# Patient Record
Sex: Female | Born: 1960
Health system: Southern US, Community
[De-identification: ages and names within clinical notes are randomized; demographics above are authoritative.]

## PROBLEM LIST (undated history)

## (undated) DIAGNOSIS — K219 Gastro-esophageal reflux disease without esophagitis: Secondary | ICD-10-CM

## (undated) DIAGNOSIS — Z800 Family history of malignant neoplasm of digestive organs: Secondary | ICD-10-CM

## (undated) DIAGNOSIS — M3500 Sicca syndrome, unspecified: Secondary | ICD-10-CM

## (undated) DIAGNOSIS — F32A Depression, unspecified: Secondary | ICD-10-CM

## (undated) DIAGNOSIS — G4730 Sleep apnea, unspecified: Secondary | ICD-10-CM

## (undated) DIAGNOSIS — N200 Calculus of kidney: Secondary | ICD-10-CM

## (undated) DIAGNOSIS — M810 Age-related osteoporosis without current pathological fracture: Secondary | ICD-10-CM

## (undated) DIAGNOSIS — F329 Major depressive disorder, single episode, unspecified: Secondary | ICD-10-CM

## (undated) DIAGNOSIS — K449 Diaphragmatic hernia without obstruction or gangrene: Secondary | ICD-10-CM

## (undated) DIAGNOSIS — G8929 Other chronic pain: Secondary | ICD-10-CM

## (undated) DIAGNOSIS — R51 Headache: Secondary | ICD-10-CM

## (undated) DIAGNOSIS — R519 Headache, unspecified: Secondary | ICD-10-CM

## (undated) DIAGNOSIS — M5430 Sciatica, unspecified side: Secondary | ICD-10-CM

## (undated) DIAGNOSIS — K589 Irritable bowel syndrome without diarrhea: Secondary | ICD-10-CM

## (undated) DIAGNOSIS — K2970 Gastritis, unspecified, without bleeding: Secondary | ICD-10-CM

## (undated) DIAGNOSIS — R7309 Other abnormal glucose: Secondary | ICD-10-CM

## (undated) DIAGNOSIS — M7500 Adhesive capsulitis of unspecified shoulder: Secondary | ICD-10-CM

## (undated) HISTORY — PX: LITHOTRIPSY: SUR834

## (undated) HISTORY — DX: Headache: R51

## (undated) HISTORY — DX: Irritable bowel syndrome, unspecified: K58.9

## (undated) HISTORY — DX: Diaphragmatic hernia without obstruction or gangrene: K44.9

## (undated) HISTORY — DX: Other chronic pain: G89.29

## (undated) HISTORY — DX: Headache, unspecified: R51.9

## (undated) HISTORY — PX: TONSILLECTOMY: SUR1361

## (undated) HISTORY — DX: Age-related osteoporosis without current pathological fracture: M81.0

## (undated) HISTORY — DX: Sjogren syndrome, unspecified: M35.00

## (undated) HISTORY — DX: Other abnormal glucose: R73.09

## (undated) HISTORY — DX: Adhesive capsulitis of unspecified shoulder: M75.00

## (undated) HISTORY — DX: Sciatica, unspecified side: M54.30

## (undated) HISTORY — DX: Sicca syndrome, unspecified: M35.00

## (undated) HISTORY — DX: Gastro-esophageal reflux disease without esophagitis: K21.9

## (undated) HISTORY — DX: Calculus of kidney: N20.0

## (undated) HISTORY — DX: Depression, unspecified: F32.A

## (undated) HISTORY — DX: Family history of malignant neoplasm of digestive organs: Z80.0

## (undated) HISTORY — DX: Sleep apnea, unspecified: G47.30

## (undated) HISTORY — DX: Gastritis, unspecified, without bleeding: K29.70

## (undated) HISTORY — DX: Major depressive disorder, single episode, unspecified: F32.9

---

## 1998-11-02 HISTORY — PX: CHOLECYSTECTOMY: SHX55

## 2000-07-27 ENCOUNTER — Encounter: Payer: Self-pay | Admitting: Gastroenterology

## 2000-07-27 DIAGNOSIS — K2970 Gastritis, unspecified, without bleeding: Secondary | ICD-10-CM | POA: Insufficient documentation

## 2000-07-27 DIAGNOSIS — K2990 Gastroduodenitis, unspecified, without bleeding: Secondary | ICD-10-CM

## 2000-08-06 ENCOUNTER — Encounter (INDEPENDENT_AMBULATORY_CARE_PROVIDER_SITE_OTHER): Payer: Self-pay | Admitting: *Deleted

## 2000-08-06 ENCOUNTER — Ambulatory Visit (HOSPITAL_COMMUNITY): Admission: RE | Admit: 2000-08-06 | Discharge: 2000-08-07 | Payer: Self-pay

## 2001-10-28 ENCOUNTER — Other Ambulatory Visit: Admission: RE | Admit: 2001-10-28 | Discharge: 2001-10-28 | Payer: Self-pay | Admitting: Obstetrics and Gynecology

## 2002-11-01 ENCOUNTER — Other Ambulatory Visit: Admission: RE | Admit: 2002-11-01 | Discharge: 2002-11-01 | Payer: Self-pay | Admitting: Obstetrics and Gynecology

## 2003-04-17 ENCOUNTER — Emergency Department (HOSPITAL_COMMUNITY): Admission: EM | Admit: 2003-04-17 | Discharge: 2003-04-17 | Payer: Self-pay | Admitting: Emergency Medicine

## 2003-04-17 ENCOUNTER — Encounter: Payer: Self-pay | Admitting: Emergency Medicine

## 2003-11-27 ENCOUNTER — Other Ambulatory Visit: Admission: RE | Admit: 2003-11-27 | Discharge: 2003-11-27 | Payer: Self-pay | Admitting: Obstetrics and Gynecology

## 2007-09-22 ENCOUNTER — Ambulatory Visit: Payer: Self-pay | Admitting: Gastroenterology

## 2007-10-03 ENCOUNTER — Ambulatory Visit: Payer: Self-pay | Admitting: Gastroenterology

## 2007-10-03 ENCOUNTER — Encounter: Payer: Self-pay | Admitting: Gastroenterology

## 2007-12-01 DIAGNOSIS — R519 Headache, unspecified: Secondary | ICD-10-CM | POA: Insufficient documentation

## 2007-12-01 DIAGNOSIS — R51 Headache: Secondary | ICD-10-CM | POA: Insufficient documentation

## 2007-12-01 DIAGNOSIS — K2100 Gastro-esophageal reflux disease with esophagitis, without bleeding: Secondary | ICD-10-CM | POA: Insufficient documentation

## 2007-12-01 DIAGNOSIS — K449 Diaphragmatic hernia without obstruction or gangrene: Secondary | ICD-10-CM | POA: Insufficient documentation

## 2007-12-01 DIAGNOSIS — K219 Gastro-esophageal reflux disease without esophagitis: Secondary | ICD-10-CM | POA: Insufficient documentation

## 2010-07-18 ENCOUNTER — Encounter (INDEPENDENT_AMBULATORY_CARE_PROVIDER_SITE_OTHER): Payer: Self-pay | Admitting: *Deleted

## 2010-07-18 ENCOUNTER — Ambulatory Visit: Payer: Self-pay | Admitting: Gastroenterology

## 2010-07-18 DIAGNOSIS — K589 Irritable bowel syndrome without diarrhea: Secondary | ICD-10-CM | POA: Insufficient documentation

## 2010-07-18 LAB — CONVERTED CEMR LAB
ALT: 18 units/L (ref 0–35)
AST: 19 units/L (ref 0–37)
Albumin: 4.5 g/dL (ref 3.5–5.2)
Alkaline Phosphatase: 82 units/L (ref 39–117)
BUN: 13 mg/dL (ref 6–23)
Basophils Absolute: 0 10*3/uL (ref 0.0–0.1)
Basophils Relative: 0.5 % (ref 0.0–3.0)
Bilirubin, Direct: 0.1 mg/dL (ref 0.0–0.3)
CO2: 30 meq/L (ref 19–32)
Calcium: 9.2 mg/dL (ref 8.4–10.5)
Chloride: 104 meq/L (ref 96–112)
Creatinine, Ser: 0.8 mg/dL (ref 0.4–1.2)
Eosinophils Absolute: 0.2 10*3/uL (ref 0.0–0.7)
Eosinophils Relative: 2.9 % (ref 0.0–5.0)
Ferritin: 56.6 ng/mL (ref 10.0–291.0)
Folate: 17.9 ng/mL
GFR calc non Af Amer: 82.11 mL/min (ref >60)
Glucose, Bld: 100 mg/dL — ABNORMAL HIGH (ref 70–99)
HCT: 37.6 % (ref 36.0–46.0)
Hemoglobin: 12.8 g/dL (ref 12.0–15.0)
IgA: 127 mg/dL (ref 68–378)
Iron: 91 ug/dL (ref 42–145)
Lymphocytes Relative: 40.3 % (ref 12.0–46.0)
Lymphs Abs: 2.4 10*3/uL (ref 0.7–4.0)
MCHC: 34.1 g/dL (ref 30.0–36.0)
MCV: 91.7 fL (ref 78.0–100.0)
Monocytes Absolute: 0.4 10*3/uL (ref 0.1–1.0)
Monocytes Relative: 6.7 % (ref 3.0–12.0)
Neutro Abs: 2.9 10*3/uL (ref 1.4–7.7)
Neutrophils Relative %: 49.6 % (ref 43.0–77.0)
Platelets: 279 10*3/uL (ref 150.0–400.0)
Potassium: 3.8 meq/L (ref 3.5–5.1)
RBC: 4.09 M/uL (ref 3.87–5.11)
RDW: 12.3 % (ref 11.5–14.6)
Saturation Ratios: 27.5 % (ref 20.0–50.0)
Sed Rate: 27 mm/hr — ABNORMAL HIGH (ref 0–22)
Sodium: 142 meq/L (ref 135–145)
TSH: 2.07 microintl units/mL (ref 0.35–5.50)
Tissue Transglutaminase Ab, IgA: 12.9 units (ref <20)
Total Bilirubin: 0.5 mg/dL (ref 0.3–1.2)
Total Protein: 7 g/dL (ref 6.0–8.3)
Transferrin: 236 mg/dL (ref 212.0–360.0)
Vitamin B-12: 701 pg/mL (ref 211–911)
WBC: 5.9 10*3/uL (ref 4.5–10.5)

## 2010-07-24 ENCOUNTER — Ambulatory Visit (HOSPITAL_COMMUNITY): Admission: RE | Admit: 2010-07-24 | Discharge: 2010-07-24 | Payer: Self-pay | Admitting: Gastroenterology

## 2010-08-18 ENCOUNTER — Ambulatory Visit: Payer: Self-pay | Admitting: Gastroenterology

## 2010-08-18 LAB — CONVERTED CEMR LAB: UREASE: NEGATIVE

## 2010-08-20 ENCOUNTER — Encounter: Payer: Self-pay | Admitting: Gastroenterology

## 2010-09-16 ENCOUNTER — Telehealth: Payer: Self-pay | Admitting: Gastroenterology

## 2010-09-17 ENCOUNTER — Telehealth: Payer: Self-pay | Admitting: Gastroenterology

## 2010-12-02 NOTE — Procedures (Signed)
Summary: Upper Endoscopy  Patient: Katrina Ford Note: All result statuses are Final unless otherwise noted.  Tests: (1) Upper Endoscopy (EGD)   EGD Upper Endoscopy       DONE     [facility]     [address]     [place], [place]  [address]           ENDOSCOPY PROCEDURE REPORT           PATIENT:  [name], [name]  MR#:  [id number]     BIRTHDATE:  [birth year], 49 yrs. old  GENDER:  female           ENDOSCOPIST:  Vania Rea. Jarold Motto, MD, [facility]     Referred by:           PROCEDURE DATE:  08/18/2010     PROCEDURE:  EGD with biopsy     ASA CLASS:  Class II     INDICATIONS:  GERD, chest pain ent problems.           MEDICATIONS:   Fentanyl 50 mcg IV, Versed 5 mg IV     TOPICAL ANESTHETIC:  Exactacain Spray           DESCRIPTION OF PROCEDURE:   After the risks benefits and     alternatives of the procedure were thoroughly explained, informed     consent was obtained.  The LB GIF-H180 [id number] endoscope was     introduced through the mouth and advanced to the second portion of     the duodenum, without limitations.  The instrument was slowly     withdrawn as the mucosa was fully examined.     <<PROCEDUREIMAGES>>           The upper, middle, and distal third of the esophagus were     carefully inspected and no abnormalities were noted. The z-line     was well seen at the GEJ. The endoscope was pushed into the fundus     which was normal including a retroflexed view. The antrum,gastric     body, first and second part of the duodenum were unremarkable.     small bowel and esophageal and clo bx. done.    Retroflexed views     revealed no abnormalities.    The scope was then withdrawn from     the patient and the procedure completed.           COMPLICATIONS:  None           ENDOSCOPIC IMPRESSION:     1) Normal EGD     chronic nonerosive better on BID NEXIUM.     RECOMMENDATIONS:     1) Await pathology results     2) Rx CLO if positive     3) continue current medications  R/O EOSINOPHILIC ESOPHAGITIS.           REPEAT EXAM:  No           ______________________________     Vania Rea. Jarold Motto, MD, Clementeen Graham           CC:  Maurice Small, MD           n.     Rosalie Doctor:   Vania Rea. Patterson at 08/18/2010 04:36 PM           Shanon Rosser, [id number]  Note: An exclamation mark (!) indicates a result that was not dispersed into the flowsheet. Document Creation Date: 08/18/2010 4:37 PM _______________________________________________________________________  (1) Order result status: Final  Collection or observation date-time: 08/18/2010 16:30 Requested date-time:  Receipt date-time:  Reported date-time:  Referring Physician:   Ordering Physician: Sheryn Bison [phone]) Specimen Source:  Source: Launa Grill Order Number: [id number] Lab site:

## 2010-12-02 NOTE — Letter (Signed)
Summary: EGD Instructions  [facility]  [address] [place], [place] [address]   Phone: [phone]  Fax: [phone]       KHARTER SESTAK    [birth year]    MRN: [id number]       Procedure Day /Date: Monday 08/18/2010     Arrival Time: 3:00pm     Procedure Time: 4:00pm     Location of Procedure:                    [facility] (4th Floor)   PREPARATION FOR ENDOSCOPY   On 10/17/2011THE DAY OF THE PROCEDURE:  1.   No solid foods, milk or milk products are allowed after midnight the night before your procedure.  2.   Do not drink anything colored red or purple.  Avoid juices with pulp.  No orange juice.  3.  You may drink clear liquids until 2:00pm, which is 2 hours before your procedure.                                                                                                CLEAR LIQUIDS INCLUDE: Water Jello Ice Popsicles Tea (sugar ok, no milk/cream) Powdered fruit flavored drinks Coffee (sugar ok, no milk/cream) Gatorade Juice: apple, white grape, white cranberry  Lemonade Clear bullion, consomm, broth Carbonated beverages (any kind) Strained chicken noodle soup Hard Candy   MEDICATION INSTRUCTIONS  Unless otherwise instructed, you should take regular prescription medications with a small sip of water as early as possible the morning of your procedure.                OTHER INSTRUCTIONS  You will need a responsible adult at least 50 years of age to accompany you and drive you home.   This person must remain in the waiting room during your procedure.  Wear loose fitting clothing that is easily removed.  Leave jewelry and other valuables at home.  However, you may wish to bring a book to read or an iPod/MP3 player to listen to music as you wait for your procedure to start.  Remove all body piercing jewelry and leave at home.  Total time from sign-in until discharge is approximately 2-3 hours.  You should go home  directly after your procedure and rest.  You can resume normal activities the day after your procedure.  The day of your procedure you should not:   Drive   Make legal decisions   Operate machinery   Drink alcohol   Return to work  You will receive specific instructions about eating, activities and medications before you leave.    The above instructions have been reviewed and explained to me by   _______________________    I fully understand and can verbalize these instructions _____________________________ Date _________

## 2010-12-02 NOTE — Miscellaneous (Signed)
Summary: Orders Update  Clinical Lists Changes  Orders: Added new Test order of TLB-H Pylori Screen Gastric Biopsy (83013-CLOTEST) - Signed 

## 2010-12-02 NOTE — Progress Notes (Signed)
Summary: talk about refill   Phone Note Call from Patient Call back at Work Phone [phone]   Call For: Dr Jarold Motto Reason for Call: Refill Medication Summary of Call: Wanted to talk to you about the refill she requested yesterday. Initial call taken by: Leanor Kail [facility],  September 17, 2010 1:39 PM  Follow-up for Phone Call        pt might need samples to make up the 30 days that she is not going to have since she picked up an old rx for once a day nexium. I advised her to call back if she does.  Follow-up by: Harlow Mares CMA Duncan Dull),  September 17, 2010 3:29 PM

## 2010-12-02 NOTE — Progress Notes (Signed)
Summary: Nexium refill   Phone Note Call from Patient Call back at Work Phone [phone]   Call For: Dr Jarold Motto Summary of Call: Needs Nexium refill. When she had her Endo she was told to continue it but did not get a script. [facility] [place] [facility] on [address] not sure of name of street Initial call taken by: Leanor Kail [facility],  September 16, 2010 8:41 AM  Follow-up for Phone Call        rx sent Follow-up by: Harlow Mares CMA Duncan Dull),  September 16, 2010 8:52 AM    Prescriptions: NEXIUM 40 MG CPDR (ESOMEPRAZOLE MAGNESIUM) take one by mouth two times a day  #60 x 11   Entered by:   Harlow Mares CMA (AAMA)   Authorized by:   Mardella Layman MD [facility]   Signed by:   Harlow Mares CMA (AAMA) on 09/16/2010   Method used:   [facility] [phone]* (retail)       [address]       [place], [place]  [address]       Ph: [phone]       Fax: [phone]   RxID:   [id number]

## 2010-12-02 NOTE — Assessment & Plan Note (Signed)
Summary: reflux...em    History of Present Illness Visit Type: Initial Consult Primary GI MD: Sheryn Bison MD FACP FAGA Primary Provider: Harrison Mons Requesting Provider: Harrison Mons Chief Complaint: Acid reflux, Takes Nexium and Tums medications not effective History of Present Illness:   50 year old Caucasian female with chronic GERD on chronic Nexium 40 mg a day. He continues with acid reflux symptoms, hoarseness, choking, constant throat clearing, and vague substernal chest pain. She denies dysphagia or any hepatobiliary complaints. She is status post cholecystectomy in 04-Apr-2000. Last endoscopy several years ago showed a hiatal hernia but otherwise was unremarkable including esophageal biopsies to exclude eosinophilic esophagitis. She has IBS with alternating diarrhea and constipation. She is up-to-date on her colonoscopy exam.   GI Review of Systems    Reports acid reflux.      Denies abdominal pain, belching, bloating, chest pain, dysphagia with liquids, dysphagia with solids, heartburn, loss of appetite, nausea, vomiting, vomiting blood, weight loss, and  weight gain.        Denies anal fissure, black tarry stools, change in bowel habit, constipation, diarrhea, diverticulosis, fecal incontinence, heme positive stool, hemorrhoids, irritable bowel syndrome, jaundice, light color stool, liver problems, rectal bleeding, and  rectal pain. Preventive Screening-Counseling & Management  Alcohol-Tobacco     Smoking Status: never      Drug Use:  no.      Current Medications (verified): 1)  Vivactil 10 Mg Tabs (Protriptyline Hcl) .Marland Kitchen.. 1 By Mouth Two Times A Day 2)  Nexium 40 Mg Cpdr (Esomeprazole Magnesium) .Marland Kitchen.. 1 By Mouth Once Daily 3)  Fish Oil  Oil (Fish Oil) .... Once Daily 4)  Anaprox 275 Mg Tabs (Naproxen Sodium) .... As Needed For Migraines  Allergies (verified): No Known Drug Allergies  Past History:  Past medical, surgical, family and social histories  (including risk factors) reviewed for relevance to current acute and chronic problems.  Past Medical History: Reviewed history from 12/01/2007 and no changes required. Current Problems:  GASTRITIS (ICD-535.50) HEADACHE, CHRONIC (ICD-784.0) HIATAL HERNIA (ICD-553.3) GERD (ICD-530.81)  Past Surgical History: Reviewed history from 12/01/2007 and no changes required. tonsillectomy cholecystectomy in 04/04/00  Family History: Reviewed history and no changes required. Family History of Colon Cancer:Uncle passed away April 04, 2009 Family History of Diabetes: mother Family History of Heart Disease: grandmother , siblings  Social History: Reviewed history and no changes required. Patient has never smoked.  Alcohol Use - no Daily Caffeine Use Illicit Drug Use - no Smoking Status:  never Drug Use:  no  Review of Systems       The patient complains of headaches-new.  The patient denies allergy/sinus, anemia, anxiety-new, arthritis/joint pain, back pain, blood in urine, breast changes/lumps, change in vision, confusion, cough, coughing up blood, depression-new, fainting, fatigue, fever, hearing problems, heart murmur, heart rhythm changes, itching, menstrual pain, muscle pains/cramps, night sweats, nosebleeds, pregnancy symptoms, shortness of breath, skin rash, sleeping problems, sore throat, swelling of feet/legs, swollen lymph glands, thirst - excessive , urination - excessive , urination changes/pain, urine leakage, vision changes, and voice change.    Vital Signs:  Patient profile:   50 year old female Height:      66 inches Weight:      150 pounds BMI:     24.30 BSA:     1.77 Pulse rate:   94 / minute Pulse rhythm:   regular BP sitting:   100 / 70  (left arm)  Vitals Entered By: Merri Ray CMA Duncan Dull) (July 18, 2010 11:30 AM)  Physical  Exam  General:  Well developed, well nourished, no acute distress.healthy appearing.  healthy appearing.   Head:  Normocephalic and  atraumatic. Eyes:  PERRLA, no icterus.exam deferred to patient's ophthalmologist.  exam deferred to patient's ophthalmologist.   Neck:  Supple; no masses or thyromegaly. Lungs:  Clear throughout to auscultation. Heart:  Regular rate and rhythm; no murmurs, rubs,  or bruits. Abdomen:  Soft, nontender and nondistended. No masses, hepatosplenomegaly or hernias noted. Normal bowel sounds. Extremities:  No clubbing, cyanosis, edema or deformities noted. Neurologic:  Alert and  oriented x4;  grossly normal neurologically. Cervical Nodes:  No significant cervical adenopathy. Psych:  Alert and cooperative. Normal mood and affect.   Impression & Recommendations:  Problem # 1:  GERD (ICD-530.81) Assessment Deteriorated Labs an ultrasound exam ordered. I have increased her Nexium to 40 mg twice a day with Reglan 5 mg at bedtime. We also will repeat her endoscopy exam with possible dilation. Gen. her clinical course she may need to be considered for manometry, 24 pH probe testing, and possible fundoplication. Stereotactic reflux maneuvers have been reviewed. Orders: TLB-CBC Platelet - w/Differential (85025-CBCD) TLB-BMP (Basic Metabolic Panel-BMET) (80048-METABOL) TLB-Hepatic/Liver Function Pnl (80076-HEPATIC) TLB-TSH (Thyroid Stimulating Hormone) (84443-TSH) TLB-B12, Serum-Total ONLY (71245-Y09) TLB-Ferritin (82728-FER) TLB-Folic Acid (Folate) (82746-FOL) TLB-IBC Pnl (Iron/FE;Transferrin) (83550-IBC) TLB-Sedimentation Rate (ESR) (85652-ESR) Ultrasound Abdomen (UAS) EGD (EGD)  Problem # 2:  IRRITABLE BOWEL SYNDROME (ICD-564.1) Assessment: Unchanged labs and ultrasound exam scheduled....consider hydrogen breath testing for bacterial overgrowth syndrome. I have asked her to eliminate sorbitol and fructose from her diet. Celiac Serologies Also Ordered.  Other Orders: T-Sprue Panel (Celiac Disease Aby Eval) (83516x3/[address]) TLB-IgA (Immunoglobulin A) (82784-IGA)  Patient Instructions: 1)   Copy sent to : Consuella Lose Griffin,MD 2)  Take Nexium one by mouth two times a day, samples provided. 3)  Please go to the basement today for your labs.  4)  Your procedure has been scheduled for 08/18/2010, please follow the seperate instructions.  5)  Your abdomianl ultrasound is scheduled for 07/24/2010, please follow the seperate instruction. 6)  [facility] Patient Information Guide given to patient.  7)  Avoid foods high in acid content ( tomatoes, citrus juices, spicy foods) . Avoid eating within 3 to 4 hours of lying down or before exercising. Do not over eat; try smaller more frequent meals. Elevate head of bed four inches when sleeping.  8)  GI Reflux brochure given.  9)  Upper Endoscopy brochure given.  10)  The medication list was reviewed and reconciled.  All changed / newly prescribed medications were explained.  A complete medication list was provided to the patient / caregiver. Prescriptions: REGLAN 5 MG TABS (METOCLOPRAMIDE HCL) take one by mouth as needed  #30 x 3   Entered by:   Harlow Mares CMA (AAMA)   Authorized by:   Mardella Layman MD [facility]   Signed by:   Lamona Curl CMA (AAMA) on 07/18/2010   Method used:   [facility] [phone]* (retail)       [address]       [place], [place]  [address]       Ph: [phone]       Fax: [phone]   RxID:   [id number]

## 2010-12-02 NOTE — Letter (Signed)
Summary: Patient [facility] Biopsy Results  [facility]  [address] [place], [place] [address]   Phone: [phone]  Fax: [phone]        August 20, 2010 MRN: [id number]    SUEANNE MANIACI [address] [place], [place]  [address]    Dear Ms. Alles,  I am pleased to inform you that the biopsies taken during your recent endoscopic examination did not show any evidence of cancer upon pathologic examination.  Additional information/recommendations:  __No further action is needed at this time.  Please follow-up with      your primary care physician for your other healthcare needs.  __ Please call [phone] to schedule a return visit to review      your condition.  XX__ Continue with the treatment plan as outlined on the day of your      exam.  __ You should have a repeat endoscopic examination for this problem              in _ months/years.   Please call us if you are having persistent problems or have questions about your condition that have not been fully answered at this time.  Sincerely,  Mardella Layman MD [facility]  This letter has been electronically signed by your physician.  Appended Document: Patient Notice-Endo Biopsy Results letter mailed

## 2011-02-04 ENCOUNTER — Other Ambulatory Visit: Payer: Self-pay | Admitting: Obstetrics and Gynecology

## 2011-03-17 NOTE — Assessment & Plan Note (Signed)
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  Maurice Small for evaluation of reflux and dysphagia.   HISTORY OF PRESENT ILLNESS:  Ms. Engebretsen has had acid reflux for at least  10 years, improved.  She saw Dr. Victorino Dike and had endoscopy in 2001.  She had a small hiatal hernia, but her exam otherwise was unremarkable,  and her CLO biopsy for H.pylori was negative.  She responded to standard  anti-reflux maneuvers and PPI therapy, but has continued to have reflux  over the years.  She has had problems with her insurance company  receiving her medications, and has recently developed solid food  dysphagia.  Dr. Valentina Lucks prescribed the Nexium 40 mg daily which was  denied by [employer].  She currently is on over-the-counter  Prilosec.  She continues to have mostly nocturnal symptoms of  regurgitation and burning substernal chest pain and has had recurrent  episodes of solid food dysphagia or mid substernal area.  She is status  post cholecystectomy in 2001 by Dr. Orpah Greek, and denies any hepatobiliary  complaints at this time.  She follows a regular diet.  Her appetite is  good.  Her weight is stable, and she denies any specific food  intolerances.  She is having regular bowel movements without melena or  hematochezia.  She did have a colonoscopy in 2001 which was  unremarkable.  Her mother does have a history of colon polyps.   The patient denies any symptoms of collagen vascular disease and  specifically denies Raynaud's phenomenon.  She has not had recent  radiographs.   PAST MEDICAL HISTORY:  1. Recurrent kidney stones.  2. Chronic headaches.   MEDICATIONS:  1. Garnette Scheuermann 28 one a day for birth control.  2. Vivactil 10 mg b.i.d.  3. Omeprazole 20 mg daily.  4. P.r.n. Naprosyn for headaches.  5. P.r.n. Zomig 5 mg.   ALLERGIES:  No known drug allergies.   FAMILY HISTORY:  Remarkable for a mother with colon polyps and several  family members with diabetes.   SOCIAL HISTORY:  She is married and lives with her husband and has a  [employer].  She does not smoke or use ethanol.   REVIEW OF SYSTEMS:  Otherwise, noncontributory .   PHYSICAL EXAMINATION:  GENERAL:  She is an attractive, healthy-appearing  white female in no distress, appearing her stated age.  VITAL SIGNS:  She is 5 feet 6 inches tall and weighs 150 pounds, blood  pressure 132/64, pulse 70 and regular.  I could not appreciate stigmata  of chronic liver disease or thyromegaly.  CHEST:  Clear, and she was in a regular rhythm without murmurs, gallops,  rubs.  ABDOMEN:  There was no hepatosplenomegaly, abdominal masses or  tenderness.  Bowel sounds were normal.  EXTREMITIES:  Peripheral extremities were unremarkable.  NEUROLOGICAL:  Mental status was clear.   ASSESSMENT:  Ms. Pilger has chronic GERD and has probable peptic  stricture of her esophagus.  She will need endoscopy, dilatation and  long term maintenance PPI therapy.   RECOMMENDATIONS:  1. Strict reflex regimen explained to patient.  Will begin Nexium 40      mg 30 minutes before supper since most of her symptoms are      nocturnal.  2. Endoscopy and dilatation at her convenience.  3. Continue other medications as per Dr. Valentina Lucks.     Vania Rea. Jarold Motto, MD, [facility], FAGA  Electronically Signed    DRP/MedQ  DD: 09/22/2007  DT: 09/22/2007  Job #: [id number]   cc:   Gretta Arab. Valentina Lucks, M.D.

## 2011-03-20 NOTE — Op Note (Signed)
[place]  Patient:    Katrina Ford, Katrina Ford                        MRN: [id number] Proc. Date: 08/06/00 Adm. Date:  [id number] Attending:  Gennie Alma CC:         Ulyess Mort, M.D. [facility]   Operative Report  [facility] [place] NUMBER: [phone]  PREOPERATIVE DIAGNOSIS:  Chronic cholecystitis and cholelithiasis.  POSTOPERATIVE DIAGNOSIS:  Chronic cholecystitis and cholelithiasis.  OPERATION:  Laparoscopic cholecystectomy and intraoperative cholangiogram.  SURGEON:  Milus Mallick, M.D.  ASSISTANT:  Donnie Coffin. Samuella Cota, M.D.  ANESTHESIA:  General endotracheal anesthesia.  DESCRIPTION OF PROCEDURE:  Under adequate general endotracheal anesthesia, the patients abdomen was prepped and draped in the usual fashion.  A short vertical incision was made in the inferior portion of the umbilicus and carried down to the deep fascia which was incised longitudinally.  The peritoneum was entered, and a 10 mm Hasson cannula was introduced into the abdomen after inserting a pursestring suture of 0 Vicryl in the fascia.  The Hasson cannula was secured with the pursestring suture.  The abdomen was then inflated with carbon dioxide, filling pressure 15 mmHg through an automatic insufflator.  The laparoscopic videoscope was introduced into the abdomen. Using this for direct vision, a 10 mm upper midline disposable port was inserted, and two 5 mm subcostal ports.  The gallbladder was chronically inflamed.  It was grasped with grasping instruments placed through the subcostal ports, and then the gallbladder was placed on traction superiorly. There were adhesions between the omentum and the gallbladder, and these were divided over hemoclips.  The triangle of Calot was exposed. Dissection revealed cystic artery which was clipped with Endoclips.  The cystic duct was then isolated and skeletonized.  It could be seen that there was a stone within the cystic duct.  An  Endoclip was placed at the cystic duct-gallbladder junction, and incision was made in the cystic duct.  The stone that could be seen was milked out of the cystic duct and removed from the operative field. A 14-gauge Angiocath was then inserted into the abdomen under direct vision. A Reddick catheter was then passed through the angiocath and threaded into the cystic duct.  It was held in place with an Endoclip.  Intraoperative cholangiogram was then done using 10 cc of 25% Hypaque and C arm fluoroscopy. This revealed a very normal-size common bile duct and good flow of dye into the duodenum and no intraluminal filling defects.   Following the cholangiogram, the clip was removed. The catheter was removed from the cystic duct and from the abdomen.  The cystic duct was triply clipped with Endoclips inferior to the incision, and it was divided above the triple clip application.  The cystic artery was divided between the previously placed distal two clips, and gallbladder was dissected out of the gallbladder bed of the liver using hook coagulation.  Hemostasis was intact in the gallbladder bed.  The gallbladder was then withdrawn into the umbilical port, and the umbilical port was withdrawn, thereby removing the gallbladder as well.  The subphrenic and subhepatic spaces were irrigated with sterile solution until clear.  The subcostal ports were removed under direct vision.  The umbilical port pursestring suture was then tied securely.  The undersurface was checked with the video laparoscope, and no adhesions were present. The abdomen was then deflated, and the upper midline port and camera were removed  from the abdomen.  The subcuticular layer of the port incisions were closed with continuous sutures of 5-0 Vicryl.  Half inch Steri-Strips were applied to the skin, and sterile dressing was applied.  Estimated blood loss for the procedure was negligible.  The patient tolerated the procedure well  and left the operating room in satisfactory condition. DD:  08/06/00 TD:  08/07/00 Job: [id number]

## 2011-09-04 ENCOUNTER — Encounter: Payer: Self-pay | Admitting: Gastroenterology

## 2011-09-28 NOTE — Progress Notes (Signed)
Addended by: Harlow Mares D on: 09/28/2011 04:04 PM   Modules accepted: Orders

## 2011-10-01 ENCOUNTER — Other Ambulatory Visit (INDEPENDENT_AMBULATORY_CARE_PROVIDER_SITE_OTHER): Payer: Managed Care, Other (non HMO)

## 2011-10-01 ENCOUNTER — Encounter: Payer: Self-pay | Admitting: Gastroenterology

## 2011-10-01 ENCOUNTER — Ambulatory Visit (INDEPENDENT_AMBULATORY_CARE_PROVIDER_SITE_OTHER): Payer: Managed Care, Other (non HMO) | Admitting: Gastroenterology

## 2011-10-01 DIAGNOSIS — R7401 Elevation of levels of liver transaminase levels: Secondary | ICD-10-CM

## 2011-10-01 DIAGNOSIS — Z9049 Acquired absence of other specified parts of digestive tract: Secondary | ICD-10-CM

## 2011-10-01 DIAGNOSIS — R109 Unspecified abdominal pain: Secondary | ICD-10-CM | POA: Insufficient documentation

## 2011-10-01 DIAGNOSIS — Z9089 Acquired absence of other organs: Secondary | ICD-10-CM

## 2011-10-01 DIAGNOSIS — K5901 Slow transit constipation: Secondary | ICD-10-CM | POA: Insufficient documentation

## 2011-10-01 DIAGNOSIS — R7402 Elevation of levels of lactic acid dehydrogenase (LDH): Secondary | ICD-10-CM

## 2011-10-01 DIAGNOSIS — K219 Gastro-esophageal reflux disease without esophagitis: Secondary | ICD-10-CM

## 2011-10-01 DIAGNOSIS — R748 Abnormal levels of other serum enzymes: Secondary | ICD-10-CM | POA: Insufficient documentation

## 2011-10-01 LAB — HEPATIC FUNCTION PANEL
ALT: 17 U/L (ref 0–35)
AST: 17 U/L (ref 0–37)
Albumin: 4.5 g/dL (ref 3.5–5.2)
Alkaline Phosphatase: 77 U/L (ref 39–117)
Total Protein: 7.4 g/dL (ref 6.0–8.3)

## 2011-10-01 LAB — IBC PANEL: Iron: 54 ug/dL (ref 42–145)

## 2011-10-01 LAB — SEDIMENTATION RATE: Sed Rate: 30 mm/hr — ABNORMAL HIGH (ref 0–22)

## 2011-10-01 MED ORDER — PEG-KCL-NACL-NASULF-NA ASC-C 100 G PO SOLR: 1.0000 | Freq: Once | ORAL | Status: DC

## 2011-10-01 MED ORDER — HYOSCYAMINE SULFATE 0.125 MG SL SUBL: 0.1250 mg | SUBLINGUAL_TABLET | SUBLINGUAL | Status: AC | PRN

## 2011-10-01 MED ORDER — POLYETHYLENE GLYCOL 3350 17 GM/SCOOP PO POWD: 17.0000 g | Freq: Every day | ORAL | Status: AC

## 2011-10-01 NOTE — Patient Instructions (Signed)
Your procedure has been scheduled for 10/05/2011, please follow the seperate instructions.  Please go to the basement today for your labs.  Your abdominal ultrasound is scheduled for 10/02/2011, arrive at [facility] radiology at 7:15am make sure you have nothing to eat or drink after midnight.  Propofol sedation handout given. Take Levsin as needed.  Take Miralax one capful in 8 ounces of liquid every night before bed.

## 2011-10-01 NOTE — Progress Notes (Signed)
This is a very pleasant 50 year old Caucasian female with chronic acid reflux requiring twice a day Nexium 40 mg and when necessary metoclopramide 5 mg. She has a long history of nephrolithiasis, and has recently had lithotripsy for calcium oxalate stones. She has had worsening constipation quiet laxative use over the last several months. Last colonoscopy was in 2001. Family history is noncontributory for gastrointestinal issues. Patient has a normal appetite and her weight is stable without food intolerances. Complains of right upper quadrant pain radiating into her right flank, also intermittent right lower quadrant pain of a spasmodic nature without real precipitating or alleviating elements. Apparently she recently had a KUB done at the time of right lower quadrant pain, and was told that she had excessive gas and colonic stool. The patient specifically denies melena, hematochezia, fever, chills, or any specific hepatobiliary complaints. She does give a history of periodically abnormal liver function test per her neurologist because of her headache medications. The patient did have multiple gallstones in 2001, and had laparoscopic cholecystectomy by Dr. Orpah Greek. There is no history of known pancreatitis, hepatitis, inflammatory bowel disease.   Current Medications, Allergies, Past Medical History, Past Surgical History, Family History and Social History were reviewed in [employer] record.  Pertinent Review of Systems Negative.. she is followed closely by Dr. Maurice Small, also regular GYN evaluations. There is no history of narcotic or other pain medication use on regular basis. He does take when necessary naproxen for headaches. The patient denies other neurologic, cardiovascular, pulmonary complaints.   Physical Exam: Awake and alert no acute distress. I cannot appreciate stigmata of chronic liver disease. Her chest is clear and she appears to be in a regular rhythm without  murmurs gallops or rubs. I cannot appreciate hepatosplenomegaly, abdominal masses or tenderness. Bowel sounds are normal. Peripheral extremities are unremarkable. Rectal exam was deferred. Mental status is normal.    Assessment and Plan: I agree with the need for screening colonoscopy which was last done 12 years ago. Her mother apparently has had recurrent diverticulitis. Her right upper quadrant pain may be related to nephrolithiasis, but she does have a history of cholelithiasis with laparoscopic cholecystectomy and apparently periodically abnormal liver function test. I have scheduled upper abdominal ultrasound exam, hepatic panel, anemia panel and sed rate. I have asked her to use a high fiber diet with daily liberal fluids, MiraLax 8 ounces at bedtime, and when necessary sublingual Levsin for colon spasms. She continues with regurgitation despite twice a day Nexium. She has had good response to metoclopramide in the past, and apparently has had no side effects on this medication which were reviewed with her. Standard anti-reflex regime was also reviewed.  Please copy her primary care physician, referring physician, and pertinent subspecialists.

## 2011-10-02 ENCOUNTER — Ambulatory Visit (HOSPITAL_COMMUNITY)
Admission: RE | Admit: 2011-10-02 | Discharge: 2011-10-02 | Disposition: A | Discharge status: Home / Self Care | Payer: Managed Care, Other (non HMO) | Source: Ambulatory Visit | Attending: Gastroenterology | Admitting: Gastroenterology

## 2011-10-02 DIAGNOSIS — N200 Calculus of kidney: Secondary | ICD-10-CM | POA: Insufficient documentation

## 2011-10-02 DIAGNOSIS — R109 Unspecified abdominal pain: Secondary | ICD-10-CM | POA: Insufficient documentation

## 2011-10-02 DIAGNOSIS — Z9089 Acquired absence of other organs: Secondary | ICD-10-CM | POA: Insufficient documentation

## 2011-10-05 ENCOUNTER — Encounter: Payer: Self-pay | Admitting: Gastroenterology

## 2011-10-05 ENCOUNTER — Ambulatory Visit (AMBULATORY_SURGERY_CENTER): Payer: Managed Care, Other (non HMO) | Admitting: Gastroenterology

## 2011-10-05 DIAGNOSIS — Z1211 Encounter for screening for malignant neoplasm of colon: Secondary | ICD-10-CM

## 2011-10-05 DIAGNOSIS — K589 Irritable bowel syndrome without diarrhea: Secondary | ICD-10-CM

## 2011-10-05 DIAGNOSIS — R1031 Right lower quadrant pain: Secondary | ICD-10-CM

## 2011-10-05 MED ORDER — SODIUM CHLORIDE 0.9 % IV SOLN: 500.0000 mL | INTRAVENOUS | Status: DC

## 2011-10-05 MED ORDER — ESOMEPRAZOLE MAGNESIUM 40 MG PO CPDR: 40.0000 mg | DELAYED_RELEASE_CAPSULE | Freq: Every day | ORAL | Status: DC

## 2011-10-05 NOTE — Patient Instructions (Signed)
NORMAL COLONOSCOPY  METAMUCIL OR BENEFIBER RECOMMENDED, TITRATE TO NEED.  SEE GREEN AND BLUE SHEETS FOR ADDITIONAL D/C INSTRUCTIONS.

## 2011-10-05 NOTE — Progress Notes (Signed)
Patient did not experience any of the following events: a burn prior to discharge; a fall within the facility; wrong site/side/patient/procedure/implant event; or a hospital transfer or hospital admission upon discharge from the facility. (G8907) Patient did not have preoperative order for IV antibiotic SSI prophylaxis. (G8918)  

## 2011-10-06 ENCOUNTER — Telehealth: Payer: Self-pay | Admitting: *Deleted

## 2011-10-06 NOTE — Telephone Encounter (Signed)
Telephone number provided rings one time then makes a loud continuous buzzing noise. No answer, unable to leave message

## 2011-10-07 ENCOUNTER — Other Ambulatory Visit: Payer: Self-pay | Admitting: Gastroenterology

## 2011-10-08 ENCOUNTER — Telehealth: Payer: Self-pay | Admitting: Gastroenterology

## 2011-10-08 NOTE — Telephone Encounter (Signed)
Pharm aware.  

## 2011-10-08 NOTE — Telephone Encounter (Signed)
Dr Jarold Motto should she be taking Nexium once a day or twice a day???

## 2011-10-08 NOTE — Telephone Encounter (Signed)
bid

## 2012-03-07 ENCOUNTER — Encounter: Payer: Self-pay | Admitting: *Deleted

## 2012-03-07 ENCOUNTER — Telehealth: Payer: Self-pay | Admitting: Gastroenterology

## 2012-03-07 NOTE — Telephone Encounter (Signed)
Did prior auth from her pharmacy and it is approved until 03/07/2013 code [id number] already faxed form to pharmacy, left message for pt.

## 2012-09-20 ENCOUNTER — Ambulatory Visit (INDEPENDENT_AMBULATORY_CARE_PROVIDER_SITE_OTHER): Payer: Managed Care, Other (non HMO) | Admitting: Gastroenterology

## 2012-09-20 ENCOUNTER — Encounter: Payer: Self-pay | Admitting: Gastroenterology

## 2012-09-20 VITALS — BP 122/66 | HR 82 | Resp 90 | Ht 65.0 in | Wt 144.0 lb

## 2012-09-20 DIAGNOSIS — K5901 Slow transit constipation: Secondary | ICD-10-CM

## 2012-09-20 DIAGNOSIS — K219 Gastro-esophageal reflux disease without esophagitis: Secondary | ICD-10-CM

## 2012-09-20 DIAGNOSIS — Z9049 Acquired absence of other specified parts of digestive tract: Secondary | ICD-10-CM

## 2012-09-20 DIAGNOSIS — K449 Diaphragmatic hernia without obstruction or gangrene: Secondary | ICD-10-CM

## 2012-09-20 DIAGNOSIS — Z9089 Acquired absence of other organs: Secondary | ICD-10-CM

## 2012-09-20 NOTE — Progress Notes (Signed)
This is a 51 year old Caucasian female with IBS who now presents with severe constipation and allegedly no bowel movement within the last week.  Associated with her constipation is worsening acid reflux despite Nexium 40 mg a day and when necessary Reglan.  Previous endoscopy was unremarkable as were esophageal biopsies.  She also had colonoscopy which was unremarkable one year ago except for a redundant and tortuous colon.  She uses nightly MiraLax and when necessary laxatives.  There is no history of recent rectal bleeding, severe abdominal pain, nausea or vomiting, bladder emptying problems or other neuromuscular diseases.  Despite all these complaints her weight is stable, and she denies a specific food intolerances.  Family history is noncontributory.  Current Medications, Allergies, Past Medical History, Past Surgical History, Family History and Social History were reviewed in [employer] record.  Pertinent Review of Systems Negative   Physical Exam: The patient in no distress.  Blood pressure 122/66, pulse 82 and regular, and weight 144 with a BMI of 23.96.  I cannot appreciate stigmata of chronic liver disease . Chest is clear and she is in a regular rhythm without murmurs gallops or rubs.  There is no abdominal distention, organomegaly, masses or tenderness.  Bowel sounds are normal.  Inspection the rectum is unremarkable as is rectal exam.  There is no evidence of a rectal impaction.  There is formed stool present which is guaiac negative.  Peripheral extremities are unremarkable and mental status is normal.    Assessment and Plan: Chronic functional constipation, rule out GI motility disorder.  I have given her a colonoscopy prep today, then will start Linzess 145 mcg a day with continued MiraLax at bedtime.  She is to stop her Reglan,, continue twice a day PPI therapy, and I have scheduled outpatient esophageal manometry.  She is status post cholecystectomy. Encounter  Diagnoses  Name Primary?  . GERD (gastroesophageal reflux disease) Yes  . Hiatal hernia

## 2012-09-20 NOTE — Patient Instructions (Addendum)
Please use Suprep to purge your bowels today. Please follow instructions on the Suprep kit given today  We have given you samples of the following medication to take: Linzess 145 mcg. Please take one capsule by mouth once daily. If this works well for you please call us back for a prescription    You have been scheduled for an esophageal manometry at [facility] on 10-17-2012 at 11:30 AM. Please arrive 30 minutes prior to your procedure for registration. You will need to go to outpatient registration (1st floor of the hospital) first. Make certain to bring your insurance cards as well as a complete list of medications.  Please remember the following:  1) Nothing to eat or drink after 12:00 midnight on the night before your test.  2) Hold all diabetic medications/insulin the morning of the test. You may eat and take your medications after the test.  3) For 3 days prior to your test do not take: Dexilant, Prevacid, Nexium, Protonix, Aciphex, Zegerid, Pantoprazole, Prilosec or omeprazole.  4) For 2 days prior to your test, do not take: Reglan, Tagamet, Zantac, Axid or Pepcid.  5) You MAY use an antacid such as Rolaids or Tums up to 12 hours prior to your test.  It will take at least 2 weeks to receive the results of this test from your physician. ------------------------------------------ ABOUT ESOPHAGEAL MANOMETRY Esophageal manometry (muh-NOM-uh-tree) is a test that gauges how well your esophagus works. Your esophagus is the long, muscular tube that connects your throat to your stomach. Esophageal manometry measures the rhythmic muscle contractions (peristalsis) that occur in your esophagus when you swallow. Esophageal manometry also measures the coordination and force exerted by the muscles of your esophagus.  During esophageal manometry, a thin, flexible tube (catheter) that contains sensors is passed through your nose, down your esophagus and into your stomach. Esophageal manometry  can be helpful in diagnosing some mostly uncommon disorders that affect your esophagus.  Why it's done Esophageal manometry is used to evaluate the movement (motility) of food through the esophagus and into the stomach. The test measures how well the circular bands of muscle (sphincters) at the top and bottom of your esophagus open and close, as well as the pressure, strength and pattern of the wave of esophageal muscle contractions that moves food along.  What you can expect Esophageal manometry is an outpatient procedure done without sedation. Most people tolerate it well. You may be asked to change into a hospital gown before the test starts.  During esophageal manometry  While you are sitting up, a member of your health care team sprays your throat with a numbing medication or puts numbing gel in your nose or both.  A catheter is guided through your nose into your esophagus. The catheter may be sheathed in a water-filled sleeve. It doesn't interfere with your breathing. However, your eyes may water, and you may gag. You may have a slight nosebleed from irritation.  After the catheter is in place, you may be asked to lie on your back on an exam table, or you may be asked to remain seated.  You then swallow small sips of water. As you do, a computer connected to the catheter records the pressure, strength and pattern of your esophageal muscle contractions.  During the test, you'll be asked to breathe slowly and smoothly, remain as still as possible, and swallow only when you're asked to do so.  A member of your health care team may move the catheter  down into your stomach while the catheter continues its measurements.  The catheter then is slowly withdrawn. The test usually lasts 20 to 30 minutes.  After esophageal manometry  When your esophageal manometry is complete, you may return to your normal activities  This test typically takes 30-45 minutes to  complete. ________________________________________________________________________________

## 2012-10-04 ENCOUNTER — Telehealth: Payer: Self-pay | Admitting: Gastroenterology

## 2012-10-04 MED ORDER — LINACLOTIDE 145 MCG PO CAPS: 145.0000 ug | ORAL_CAPSULE | Freq: Every day | ORAL | Status: DC

## 2012-10-04 NOTE — Telephone Encounter (Signed)
Informed pt I will order the Linzess and gave her the number to r/s her EM appt; pt stated understanding.

## 2012-10-05 ENCOUNTER — Telehealth: Payer: Self-pay | Admitting: *Deleted

## 2012-10-05 ENCOUNTER — Telehealth: Payer: Self-pay | Admitting: Gastroenterology

## 2012-10-05 NOTE — Telephone Encounter (Signed)
I called [phone] and spoke with [facility] and she transferred me to [facility] a pharmacy specialist with [facility].  Per Misty Stanley  patient's Linzess 145 mcg was approved for one year starting today.   Authorization approval number-----[id number].  Information was sent to pharmacy per Misty Stanley and patient will be notified.  Fax approval will be sent as well.

## 2012-10-05 NOTE — Telephone Encounter (Signed)
Pt reports [facility] stated we would have to call to r/s; changed to 11/29/11. Pt stated understanding.

## 2012-11-06 ENCOUNTER — Other Ambulatory Visit: Payer: Self-pay | Admitting: Gastroenterology

## 2012-11-28 ENCOUNTER — Ambulatory Visit (HOSPITAL_COMMUNITY)
Admission: RE | Admit: 2012-11-28 | Discharge: 2012-11-28 | Disposition: A | Discharge status: Home / Self Care | Payer: 59 | Source: Ambulatory Visit | Attending: Gastroenterology | Admitting: Gastroenterology

## 2012-11-28 ENCOUNTER — Encounter (HOSPITAL_COMMUNITY)
Admission: RE | Disposition: A | Discharge status: Home / Self Care | Payer: Self-pay | Source: Ambulatory Visit | Attending: Gastroenterology

## 2012-11-28 DIAGNOSIS — R1310 Dysphagia, unspecified: Secondary | ICD-10-CM | POA: Insufficient documentation

## 2012-11-28 DIAGNOSIS — K449 Diaphragmatic hernia without obstruction or gangrene: Secondary | ICD-10-CM | POA: Insufficient documentation

## 2012-11-28 DIAGNOSIS — K219 Gastro-esophageal reflux disease without esophagitis: Secondary | ICD-10-CM | POA: Insufficient documentation

## 2012-11-28 HISTORY — PX: ESOPHAGEAL MANOMETRY: SHX5429

## 2012-11-28 SURGERY — MANOMETRY, ESOPHAGUS

## 2012-11-28 MED ORDER — LIDOCAINE VISCOUS 2 % MT SOLN
OROMUCOSAL | Status: AC
  Filled 2012-11-28: qty 15

## 2012-11-29 ENCOUNTER — Encounter (HOSPITAL_COMMUNITY): Payer: Self-pay | Admitting: Gastroenterology

## 2012-12-08 ENCOUNTER — Telehealth: Payer: Self-pay | Admitting: Gastroenterology

## 2012-12-08 NOTE — Telephone Encounter (Signed)
Informed pt of EM results and she questioned the "normal report, no Hiatal Hernia". She and EGD from 10/03/2007 mention a HH. Explained to pt, usually it's best for pt's to come in to discuss EM results d/t the complexity of the test. Pt agreed and will see Dr Jarold Motto on 12/13/12.

## 2012-12-13 ENCOUNTER — Ambulatory Visit (INDEPENDENT_AMBULATORY_CARE_PROVIDER_SITE_OTHER): Payer: 59 | Admitting: Gastroenterology

## 2012-12-13 ENCOUNTER — Encounter: Payer: Self-pay | Admitting: Gastroenterology

## 2012-12-13 VITALS — BP 120/60 | HR 95 | Ht 65.0 in | Wt 145.0 lb

## 2012-12-13 DIAGNOSIS — R079 Chest pain, unspecified: Secondary | ICD-10-CM

## 2012-12-13 DIAGNOSIS — K5900 Constipation, unspecified: Secondary | ICD-10-CM

## 2012-12-13 DIAGNOSIS — K589 Irritable bowel syndrome without diarrhea: Secondary | ICD-10-CM

## 2012-12-13 MED ORDER — SUCRALFATE 1 G PO TABS: 1.0000 g | ORAL_TABLET | Freq: Four times a day (QID) | ORAL | Status: DC

## 2012-12-13 NOTE — Patient Instructions (Addendum)
Please purchase Metamucil over the counter. Take as directed.  We will send a referral to the pain clinic for your a-typical chest pain and you will be contacted about your appointment.  Please continue Nexium.  High fiber diet information below.  Carafate was sent to your pharmacy please use as directed. _____________________________________________________________________________________________________________________   High-Fiber Diet Fiber is found in fruits, vegetables, and grains. A high-fiber diet encourages the addition of more whole grains, legumes, fruits, and vegetables in your diet. The recommended amount of fiber for adult males is 38 g per day. For adult females, it is 25 g per day. Pregnant and lactating women should get 28 g of fiber per day. If you have a digestive or bowel problem, ask your caregiver for advice before adding high-fiber foods to your diet. Eat a variety of high-fiber foods instead of only a select few type of foods.  PURPOSE  To increase stool bulk.  To make bowel movements more regular to prevent constipation.  To lower cholesterol.  To prevent overeating. WHEN IS THIS DIET USED?  It may be used if you have constipation and hemorrhoids.  It may be used if you have uncomplicated diverticulosis (intestine condition) and irritable bowel syndrome.  It may be used if you need help with weight management.  It may be used if you want to add it to your diet as a protective measure against atherosclerosis, diabetes, and cancer. SOURCES OF FIBER  Whole-grain breads and cereals.  Fruits, such as apples, oranges, bananas, berries, prunes, and pears.  Vegetables, such as green peas, carrots, sweet potatoes, beets, broccoli, cabbage, spinach, and artichokes.  Legumes, such split peas, soy, lentils.  Almonds. FIBER CONTENT IN FOODS Starches and Grains / Dietary Fiber (g)  Cheerios, 1 cup / 3 g  Corn Flakes cereal, 1 cup / 0.7 g  Rice crispy treat  cereal, 1 cup / 0.3 g  Instant oatmeal (cooked),  cup / 2 g  Frosted wheat cereal, 1 cup / 5.1 g  Brown, long-grain rice (cooked), 1 cup / 3.5 g  White, long-grain rice (cooked), 1 cup / 0.6 g  Enriched macaroni (cooked), 1 cup / 2.5 g Legumes / Dietary Fiber (g)  Baked beans (canned, plain, or vegetarian),  cup / 5.2 g  Kidney beans (canned),  cup / 6.8 g  Pinto beans (cooked),  cup / 5.5 g Breads and Crackers / Dietary Fiber (g)  Plain or honey graham crackers, 2 squares / 0.7 g  Saltine crackers, 3 squares / 0.3 g  Plain, salted pretzels, 10 pieces / 1.8 g  Whole-wheat bread, 1 slice / 1.9 g  White bread, 1 slice / 0.7 g  Raisin bread, 1 slice / 1.2 g  Plain bagel, 3 oz / 2 g  Flour tortilla, 1 oz / 0.9 g  Corn tortilla, 1 small / 1.5 g  Hamburger or hotdog bun, 1 small / 0.9 g Fruits / Dietary Fiber (g)  Apple with skin, 1 medium / 4.4 g  Sweetened applesauce,  cup / 1.5 g  Banana,  medium / 1.5 g  Grapes, 10 grapes / 0.4 g  Orange, 1 small / 2.3 g  Raisin, 1.5 oz / 1.6 g  Melon, 1 cup / 1.4 g Vegetables / Dietary Fiber (g)  Green beans (canned),  cup / 1.3 g  Carrots (cooked),  cup / 2.3 g  Broccoli (cooked),  cup / 2.8 g  Peas (cooked),  cup / 4.4 g  Mashed potatoes,  cup /  1.6 g  Lettuce, 1 cup / 0.5 g  Corn (canned),  cup / 1.6 g  Tomato,  cup / 1.1 g Document Released: 10/19/2005 Document Revised: 04/19/2012 Document Reviewed: 01/21/2012 [facility] Patient Information 2013 [place], [place].

## 2012-12-13 NOTE — Progress Notes (Signed)
This is a 52 year old Caucasian female with constipation predominant IBS, gas, bloating, and very atypical chest pain which continues despite twice a day Nexium 40 mg.  Recent high-resolution esophageal manometry was reviewed with patient today and was entirely normal.  She continues to complain of atypical chest pain of a constant nature without associated dysphagia or any specific cardiovascular pulmonary complaints.  She continues to have gas, bloating, constipation, and apparently recurrent kidney stones.  Despite all these complaints she's had no anorexia, weight loss, food intolerances, and is status post cholecystectomy.   Current Medications, Allergies, Past Medical History, Past Surgical History, Family History and Social History were reviewed in [employer] record.  ROS: All systems were reviewed and are negative unless otherwise stated in the HPI.          Physical Exam: Healthy-appearing patient in no distress.  Blood pressure 120/60, pulse 90 and regular, and weight 145 with a BMI of 24.13.  I cannot appreciate stigmata of chronic liver disease.  Abdominal exam shows no organomegaly, masses, tenderness, or distention.  She appear to be in a regular rhythm without murmurs gallops or rubs.  Chest is generally clear.  Mental status is normal.    Assessment and Plan: Atypical chest pain with normal esophageal manometry and endoscopy and failure to respond to Nexium 40 mg twice a day.... Workup  consistent with functional chest pain.  She also has functional chronic constipation with gas and bloating.  I've asked her to continue MiraLax, Nexium, and we have prescribed when necessary Carafate at her request.  I have referred her to pain management clinic here to hopefully help her with her chronic pain syndromes. No diagnosis found.

## 2013-03-02 HISTORY — PX: FOOT SURGERY: SHX648

## 2013-03-02 LAB — HM PAP SMEAR: HM Pap smear: ABNORMAL

## 2013-04-27 LAB — HM COLONOSCOPY: HM COLON: NORMAL

## 2013-04-28 ENCOUNTER — Telehealth: Payer: Self-pay | Admitting: *Deleted

## 2013-04-28 NOTE — Telephone Encounter (Signed)
I called [employer] at [phone] I spoke with Leotis Shames

## 2013-04-28 NOTE — Telephone Encounter (Signed)
Per Katrina Ford medication has been approved for a yr. Pharmacy notified.

## 2013-05-31 ENCOUNTER — Encounter (HOSPITAL_BASED_OUTPATIENT_CLINIC_OR_DEPARTMENT_OTHER): Payer: Self-pay | Admitting: *Deleted

## 2013-05-31 ENCOUNTER — Emergency Department (HOSPITAL_BASED_OUTPATIENT_CLINIC_OR_DEPARTMENT_OTHER)
Admission: EM | Admit: 2013-05-31 | Discharge: 2013-06-01 | Disposition: A | Discharge status: Home / Self Care | Payer: Managed Care, Other (non HMO) | Attending: Emergency Medicine | Admitting: Emergency Medicine

## 2013-05-31 ENCOUNTER — Emergency Department (HOSPITAL_BASED_OUTPATIENT_CLINIC_OR_DEPARTMENT_OTHER): Payer: Managed Care, Other (non HMO)

## 2013-05-31 DIAGNOSIS — K219 Gastro-esophageal reflux disease without esophagitis: Secondary | ICD-10-CM | POA: Insufficient documentation

## 2013-05-31 DIAGNOSIS — Z8719 Personal history of other diseases of the digestive system: Secondary | ICD-10-CM | POA: Insufficient documentation

## 2013-05-31 DIAGNOSIS — Z79899 Other long term (current) drug therapy: Secondary | ICD-10-CM | POA: Insufficient documentation

## 2013-05-31 DIAGNOSIS — Z8669 Personal history of other diseases of the nervous system and sense organs: Secondary | ICD-10-CM | POA: Insufficient documentation

## 2013-05-31 DIAGNOSIS — N200 Calculus of kidney: Secondary | ICD-10-CM

## 2013-05-31 DIAGNOSIS — G8929 Other chronic pain: Secondary | ICD-10-CM | POA: Insufficient documentation

## 2013-05-31 LAB — URINALYSIS, ROUTINE W REFLEX MICROSCOPIC
Ketones, ur: NEGATIVE mg/dL
Leukocytes, UA: NEGATIVE
Nitrite: NEGATIVE
Specific Gravity, Urine: 1.013 (ref 1.005–1.030)
Urobilinogen, UA: 0.2 mg/dL (ref 0.0–1.0)
pH: 6.5 (ref 5.0–8.0)

## 2013-05-31 LAB — URINE MICROSCOPIC-ADD ON

## 2013-05-31 NOTE — ED Notes (Signed)
Pt c/o left flank pain x 2 days  Hx kidney stones

## 2013-06-01 LAB — BASIC METABOLIC PANEL
BUN: 12 mg/dL (ref 6–23)
CO2: 28 mEq/L (ref 19–32)
Calcium: 10.1 mg/dL (ref 8.4–10.5)
GFR calc non Af Amer: 72 mL/min — ABNORMAL LOW (ref >90)
Glucose, Bld: 108 mg/dL — ABNORMAL HIGH (ref 70–99)

## 2013-06-01 LAB — CBC WITH DIFFERENTIAL/PLATELET
Eosinophils Absolute: 0.2 10*3/uL (ref 0.0–0.7)
Eosinophils Relative: 2 % (ref 0–5)
HCT: 38.3 % (ref 36.0–46.0)
Hemoglobin: 13.2 g/dL (ref 12.0–15.0)
Lymphocytes Relative: 16 % (ref 12–46)
Lymphs Abs: 1.6 10*3/uL (ref 0.7–4.0)
MCH: 31.1 pg (ref 26.0–34.0)
MCV: 90.1 fL (ref 78.0–100.0)
Monocytes Absolute: 0.8 10*3/uL (ref 0.1–1.0)
Monocytes Relative: 8 % (ref 3–12)
RBC: 4.25 MIL/uL (ref 3.87–5.11)

## 2013-06-01 MED ORDER — OXYCODONE-ACETAMINOPHEN 5-325 MG PO TABS: 1.0000 | ORAL_TABLET | Freq: Four times a day (QID) | ORAL | Status: DC | PRN

## 2013-06-01 MED ORDER — ONDANSETRON 8 MG PO TBDP: ORAL_TABLET | ORAL | Status: DC

## 2013-06-01 MED ORDER — KETOROLAC TROMETHAMINE 30 MG/ML IJ SOLN
30.0000 mg | Freq: Once | INTRAMUSCULAR | Status: AC
  Administered 2013-06-01: 30 mg via INTRAVENOUS
  Filled 2013-06-01: qty 1

## 2013-06-01 NOTE — ED Notes (Signed)
Pt passed 2 stones while voiding. Pt was able to save stones. Stones placed specimen cup and pt instructed to take with her to urologist.

## 2013-06-01 NOTE — ED Provider Notes (Signed)
CSN: [id number]     Arrival date & time 05/31/13  Mar 24, 2238 History     First MD Initiated Contact with Patient 06/01/13 0018     Chief Complaint  Patient presents with  . Flank Pain   (Consider location/radiation/quality/duration/timing/severity/associated sxs/prior Treatment) Patient is a 52 y.o. female presenting with flank pain. The history is provided by the patient.  Flank Pain This is a recurrent problem. The current episode started 2 days ago. The problem occurs constantly. The problem has not changed since onset.Pertinent negatives include no chest pain, no headaches and no shortness of breath. Associated symptoms comments: Left flank pain. Nothing aggravates the symptoms. Nothing relieves the symptoms. She has tried nothing for the symptoms. The treatment provided no relief.    Past Medical History  Diagnosis Date  . Gastritis   . Chronic headache   . Hiatal hernia   . GERD (gastroesophageal reflux disease)   . Family history of malignant neoplasm of gastrointestinal tract   . IBS (irritable bowel syndrome)   . Kidney stones    Past Surgical History  Procedure Laterality Date  . Tonsillectomy    . Cholecystectomy  2000  . Lithotripsy  [id number]  . Esophageal manometry  11/28/2012    Procedure: ESOPHAGEAL MANOMETRY (EM);  Surgeon: Mardella Layman, MD;  Location: [facility];  Service: Endoscopy;  Laterality: N/A;   Family History  Problem Relation Age of Onset  . Colon cancer Maternal Uncle 44    Passed away in 2009/03/24  . Diabetes Mother   . Heart disease Mother   . Heart disease Paternal Grandmother     Siblings also  . Hyperlipidemia Father   . Hyperlipidemia Brother   . Hyperlipidemia Sister   . Diverticulitis Mother    History  Substance Use Topics  . Smoking status: Never Smoker   . Smokeless tobacco: Never Used  . Alcohol Use: No   OB History   Grav Para Term Preterm Abortions TAB SAB Ect Mult Living                 Review of Systems  Respiratory:  Negative for shortness of breath.   Cardiovascular: Negative for chest pain.  Genitourinary: Positive for flank pain.  Neurological: Negative for headaches.  All other systems reviewed and are negative.    Allergies  Review of patient's allergies indicates no known allergies.  Home Medications   Current Outpatient Rx  Name  Route  Sig  Dispense  Refill  . estradiol (ESTRACE) 0.1 MG/GM vaginal cream   Vaginal   Place 2 g vaginally daily.         Marland Kitchen NEXIUM 40 MG capsule      TAKE ONE CAPSULE BY MOUTH TWICE A DAY   60 capsule   8   . ondansetron (ZOFRAN ODT) 8 MG disintegrating tablet      8mg  ODT q8 hours prn nausea   4 tablet   0   . oxyCODONE-acetaminophen (PERCOCET) 5-325 MG per tablet   Oral   Take 1 tablet by mouth every 6 (six) hours as needed for pain.   10 tablet   0   . polyethylene glycol (MIRALAX / GLYCOLAX) packet   Oral   Take 17 g by mouth daily.         . protriptyline (VIVACTIL) 10 MG tablet   Oral   Take 10 mg by mouth at bedtime. Take 1 by mouth 2 times a day.          Marland Kitchen  zonisamide (ZONEGRAN) 100 MG capsule   Oral   Take 100 mg by mouth daily.            BP 134/75  Pulse 85  Temp(Src) 98 F (36.7 C) (Oral)  Resp 16  Ht 5\' 5"  (1.651 m)  Wt 138 lb (62.596 kg)  BMI 22.96 kg/m2  SpO2 100% Physical Exam  Constitutional: She is oriented to person, place, and time. She appears well-developed and well-nourished. No distress.  HENT:  Head: Normocephalic and atraumatic.  Mouth/Throat: Oropharynx is clear and moist.  Eyes: Conjunctivae are normal. Pupils are equal, round, and reactive to light.  Neck: Normal range of motion. Neck supple.  Cardiovascular: Normal rate, regular rhythm and intact distal pulses.   Pulmonary/Chest: Effort normal and breath sounds normal. She has no wheezes. She has no rales.  Abdominal: Soft. Bowel sounds are normal. There is no tenderness. There is no rebound and no guarding.  Musculoskeletal: Normal range  of motion.  Neurological: She is alert and oriented to person, place, and time.  Skin: Skin is warm and dry.  Psychiatric: She has a normal mood and affect.    ED Course   Procedures (including critical care time)  Labs Reviewed  URINALYSIS, ROUTINE W REFLEX MICROSCOPIC - Abnormal; Notable for the following:    Hgb urine dipstick MODERATE (*)    All other components within normal limits  BASIC METABOLIC PANEL - Abnormal; Notable for the following:    Glucose, Bld 108 (*)    GFR calc non Af Amer 72 (*)    GFR calc Af Amer 84 (*)    All other components within normal limits  URINE MICROSCOPIC-ADD ON  CBC WITH DIFFERENTIAL   Ct Abdomen Pelvis Wo Contrast  06/01/2013   *RADIOLOGY REPORT*  Clinical Data: Left flank pain, hematuria and nausea.  History of renal calculi and prior lithotripsy.  CT ABDOMEN AND PELVIS WITHOUT CONTRAST  Technique:  Multidetector CT imaging of the abdomen and pelvis was performed following the standard protocol without intravenous contrast.  Comparison: None.  Findings: There are at least three nonobstructing calculi in the left kidney.  The largest is a 5 mm calculus in the lower pole collecting system.  There is suggestion of mild dilatation of the renal pelvis and mild dilatation of the left ureter relative to the right. The patient appears to have passed a ureteral calculus with a 3 mm stone visualized in the dependent bladder.  The right kidney and ureter are normal.  The unenhanced appearance of the liver, spleen, pancreas, adrenal glands and bowel are unremarkable.  The gallbladder has been removed.  No abnormal fluid collections are seen.  No incidental masses or enlarged lymph nodes.  No hernias are identified.  Bony structures show a minimal anterolisthesis of the L4 vertebral body on L5 of approximately 5 mm.  IMPRESSION: Nonobstructing calculi identified in the left kidney.  Mild dilatation of the left renal pelvis and ureter with a 3 mm calculus present in  the bladder.   Original Report Authenticated By: Irish Lack, M.D.   1. Kidney stones     MDM  Passed stones in the department  Denna Fryberger K Garen Woolbright-Rasch, MD 06/01/13 [phone]

## 2013-06-01 NOTE — ED Notes (Signed)
MD at bedside. 

## 2013-06-01 NOTE — ED Notes (Signed)
Pt given water to drink. Pt reported that she had passed 2 stones. RN notified

## 2013-06-01 NOTE — ED Notes (Signed)
Pt declined any additional pain medications.

## 2013-09-07 ENCOUNTER — Other Ambulatory Visit: Payer: Self-pay

## 2013-11-04 ENCOUNTER — Emergency Department (HOSPITAL_BASED_OUTPATIENT_CLINIC_OR_DEPARTMENT_OTHER): Payer: Managed Care, Other (non HMO)

## 2013-11-04 ENCOUNTER — Encounter (HOSPITAL_BASED_OUTPATIENT_CLINIC_OR_DEPARTMENT_OTHER): Payer: Self-pay | Admitting: Emergency Medicine

## 2013-11-04 ENCOUNTER — Emergency Department (HOSPITAL_BASED_OUTPATIENT_CLINIC_OR_DEPARTMENT_OTHER)
Admission: EM | Admit: 2013-11-04 | Discharge: 2013-11-04 | Disposition: A | Discharge status: Home / Self Care | Payer: Managed Care, Other (non HMO) | Attending: Emergency Medicine | Admitting: Emergency Medicine

## 2013-11-04 DIAGNOSIS — K219 Gastro-esophageal reflux disease without esophagitis: Secondary | ICD-10-CM | POA: Insufficient documentation

## 2013-11-04 DIAGNOSIS — R112 Nausea with vomiting, unspecified: Secondary | ICD-10-CM | POA: Insufficient documentation

## 2013-11-04 DIAGNOSIS — Z87442 Personal history of urinary calculi: Secondary | ICD-10-CM | POA: Insufficient documentation

## 2013-11-04 DIAGNOSIS — R110 Nausea: Secondary | ICD-10-CM

## 2013-11-04 DIAGNOSIS — R109 Unspecified abdominal pain: Secondary | ICD-10-CM | POA: Insufficient documentation

## 2013-11-04 DIAGNOSIS — Z79899 Other long term (current) drug therapy: Secondary | ICD-10-CM | POA: Insufficient documentation

## 2013-11-04 DIAGNOSIS — Z3202 Encounter for pregnancy test, result negative: Secondary | ICD-10-CM | POA: Insufficient documentation

## 2013-11-04 DIAGNOSIS — R339 Retention of urine, unspecified: Secondary | ICD-10-CM | POA: Insufficient documentation

## 2013-11-04 DIAGNOSIS — Z9089 Acquired absence of other organs: Secondary | ICD-10-CM | POA: Insufficient documentation

## 2013-11-04 LAB — BASIC METABOLIC PANEL
BUN: 15 mg/dL (ref 6–23)
CO2: 26 mEq/L (ref 19–32)
Calcium: 9.4 mg/dL (ref 8.4–10.5)
Chloride: 102 mEq/L (ref 96–112)
Creatinine, Ser: 0.9 mg/dL (ref 0.50–1.10)
GFR, EST AFRICAN AMERICAN: 84 mL/min — AB (ref >90)
GFR, EST NON AFRICAN AMERICAN: 72 mL/min — AB (ref >90)
Glucose, Bld: 108 mg/dL — ABNORMAL HIGH (ref 70–99)
POTASSIUM: 3.9 meq/L (ref 3.7–5.3)
Sodium: 140 mEq/L (ref 137–147)

## 2013-11-04 LAB — URINALYSIS, ROUTINE W REFLEX MICROSCOPIC
Bilirubin Urine: NEGATIVE
Glucose, UA: NEGATIVE mg/dL
Hgb urine dipstick: NEGATIVE
KETONES UR: NEGATIVE mg/dL
LEUKOCYTES UA: NEGATIVE
NITRITE: NEGATIVE
PH: 7 (ref 5.0–8.0)
Protein, ur: NEGATIVE mg/dL
SPECIFIC GRAVITY, URINE: 1.014 (ref 1.005–1.030)
Urobilinogen, UA: 0.2 mg/dL (ref 0.0–1.0)

## 2013-11-04 LAB — PREGNANCY, URINE: Preg Test, Ur: NEGATIVE

## 2013-11-04 MED ORDER — ONDANSETRON HCL 4 MG/2ML IJ SOLN: 4.0000 mg | Freq: Once | INTRAMUSCULAR | Status: AC

## 2013-11-04 MED ORDER — MORPHINE SULFATE 4 MG/ML IJ SOLN
6.0000 mg | Freq: Once | INTRAMUSCULAR | Status: AC | PRN
  Administered 2013-11-04: 6 mg via INTRAVENOUS
  Filled 2013-11-04: qty 2

## 2013-11-04 MED ORDER — ONDANSETRON HCL 4 MG/2ML IJ SOLN
INTRAMUSCULAR | Status: AC
  Administered 2013-11-04: 4 mg via INTRAVENOUS
  Filled 2013-11-04: qty 2

## 2013-11-04 MED ORDER — OXYCODONE-ACETAMINOPHEN 5-325 MG PO TABS
2.0000 | ORAL_TABLET | ORAL | Status: DC | PRN
Start: 2013-11-04 — End: 2014-01-05

## 2013-11-04 MED ORDER — SODIUM CHLORIDE 0.9 % IV BOLUS (SEPSIS): 1000.0000 mL | Freq: Once | INTRAVENOUS | Status: DC

## 2013-11-04 MED ORDER — KETOROLAC TROMETHAMINE 30 MG/ML IJ SOLN
30.0000 mg | Freq: Once | INTRAMUSCULAR | Status: AC
  Administered 2013-11-04: 30 mg via INTRAVENOUS
  Filled 2013-11-04: qty 1

## 2013-11-04 MED ORDER — HYDROCODONE-ACETAMINOPHEN 5-325 MG PO TABS: 2.0000 | ORAL_TABLET | ORAL | Status: DC | PRN

## 2013-11-04 MED ORDER — METOCLOPRAMIDE HCL 10 MG PO TABS
10.0000 mg | ORAL_TABLET | Freq: Four times a day (QID) | ORAL | Status: DC | PRN
Start: 2013-11-04 — End: 2014-01-05

## 2013-11-04 NOTE — Discharge Instructions (Signed)
Call your urologist on Monday. There are other causes of flank pain so if pain focuses in your abdomen or you worsen see your physician or come back to the ER.  Take ibuprofen for pain. For severe pain take norco or vicodin however realize they have the potential for addiction and it can make you sleepy and has tylenol in it.  No operating machinery while taking. If you were given medicines take as directed.  If you are on coumadin or contraceptives realize their levels and effectiveness is altered by many different medicines.  If you have any reaction (rash, tongues swelling, other) to the medicines stop taking and see a physician.   Please follow up as directed and return to the ER or see a physician for new or worsening symptoms (fevers, persistent vomiting, uncontrolled pain).   Thank you.

## 2013-11-04 NOTE — ED Provider Notes (Signed)
CSN: [id number]     Arrival date & time 11/04/13  1308 History   First MD Initiated Contact with Patient 11/04/13 1351     Chief Complaint  Patient presents with  . Flank Pain  . Groin Pain  . Nausea  . Urinary Retention   (Consider location/radiation/quality/duration/timing/severity/associated sxs/prior Treatment) HPI Comments: 53 yo female with hx of IBS, kidney stones/ lithotripsy/ followed at [facility] urology presents with acute sharp left flank pain since 3 am, similar to previous, known 5 mm stone left kidney, mild nausea.  Constant.  No gi bleeding or diverticular hx.  No abdo pain.  No fevers.  Nothing improves.   Patient is a 53 y.o. female presenting with flank pain and groin pain. The history is provided by the patient.  Flank Pain This is a recurrent problem. Pertinent negatives include no chest pain, no abdominal pain, no headaches and no shortness of breath.  Groin Pain Pertinent negatives include no chest pain, no abdominal pain, no headaches and no shortness of breath.    Past Medical History  Diagnosis Date  . Gastritis   . Chronic headache   . Hiatal hernia   . GERD (gastroesophageal reflux disease)   . Family history of malignant neoplasm of gastrointestinal tract   . IBS (irritable bowel syndrome)   . Kidney stones    Past Surgical History  Procedure Laterality Date  . Tonsillectomy    . Cholecystectomy  03/02/99  . Lithotripsy  [id number]  . Esophageal manometry  11/28/2012    Procedure: ESOPHAGEAL MANOMETRY (EM);  Surgeon: Sable Feil, MD;  Location: [facility] ENDOSCOPY;  Service: Endoscopy;  Laterality: N/A;   Family History  Problem Relation Age of Onset  . Colon cancer Maternal Uncle 24    Passed away in 03/01/2009  . Diabetes Mother   . Heart disease Mother   . Heart disease Paternal Grandmother     Siblings also  . Hyperlipidemia Father   . Hyperlipidemia Brother   . Hyperlipidemia Sister   . Diverticulitis Mother    History  Substance Use Topics  . Smoking  status: Never Smoker   . Smokeless tobacco: Never Used  . Alcohol Use: No   OB History   Grav Para Term Preterm Abortions TAB SAB Ect Mult Living                 Review of Systems  Constitutional: Negative for fever and chills.  HENT: Negative for congestion.   Eyes: Negative for visual disturbance.  Respiratory: Negative for shortness of breath.   Cardiovascular: Negative for chest pain.  Gastrointestinal: Positive for nausea and vomiting. Negative for abdominal pain and blood in stool.  Genitourinary: Positive for flank pain. Negative for dysuria.  Musculoskeletal: Negative for back pain, neck pain and neck stiffness.  Skin: Negative for rash.  Neurological: Negative for light-headedness and headaches.    Allergies  Review of patient's allergies indicates no known allergies.  Home Medications   Current Outpatient Rx  Name  Route  Sig  Dispense  Refill  . esomeprazole (NEXIUM) 40 MG capsule   Oral   Take 40 mg by mouth daily at 12 noon.         . protriptyline (VIVACTIL) 10 MG tablet   Oral   Take 10 mg by mouth at bedtime. Take 1 by mouth 2 times a day.          . zonisamide (ZONEGRAN) 100 MG capsule   Oral   Take 100 mg by  mouth daily.            BP 136/69  Pulse 92  Temp(Src) 97.4 F (36.3 C) (Oral)  Resp 16  Ht 5\' 5"  (1.651 m)  Wt 137 lb (62.143 kg)  BMI 22.80 kg/m2  SpO2 100% Physical Exam  Nursing note and vitals reviewed. Constitutional: She is oriented to person, place, and time. She appears well-developed and well-nourished.  HENT:  Head: Normocephalic and atraumatic.  Mild dry mm  Eyes: Conjunctivae are normal. Right eye exhibits no discharge. Left eye exhibits no discharge.  Neck: Normal range of motion. Neck supple. No tracheal deviation present.  Cardiovascular: Normal rate and regular rhythm.   Pulmonary/Chest: Effort normal and breath sounds normal.  Abdominal: Soft. She exhibits no distension. There is no tenderness. There is no  guarding.  Musculoskeletal: She exhibits tenderness (left flank). She exhibits no edema.  Neurological: She is alert and oriented to person, place, and time.  Skin: Skin is warm. No rash noted.  Psychiatric: She has a normal mood and affect.    ED Course  Procedures (including critical care time) Emergency Focused Ultrasound Exam Limited retroperitoneal ultrasound of kidneys  Performed and interpreted by Dr. Reather Converse Indication: flank pain Focused abdominal ultrasound with both kidneys imaged in transverse and longitudinal planes in real-time. Interpretation: mild left hydronephrosis visualized.  No stones or cysts visualized  Images archived electronically  Labs Review Labs Reviewed  BASIC METABOLIC PANEL - Abnormal; Notable for the following:    Glucose, Bld 108 (*)    GFR calc non Af Amer 72 (*)    GFR calc Af Amer 84 (*)    All other components within normal limits  URINALYSIS, ROUTINE W REFLEX MICROSCOPIC  PREGNANCY, URINE   Imaging Review Dg Abd 1 View  11/04/2013   CLINICAL DATA:  Flank pain and groin pain. Urinary retention. History of kidney stones.  EXAM: ABDOMEN - 1 VIEW  COMPARISON:  CT of the abdomen and pelvis 06/01/2013.  FINDINGS: Gas and stool are seen scattered throughout the colon extending to the level of the distal rectum. No pathologic distension of small bowel is noted. No gross evidence of pneumoperitoneum. Surgical clips project over the right upper quadrant of the abdomen, compatible with prior cholecystectomy. Multiple calcifications with central lucency are noted in the left hemipelvis, compatible with phleboliths. No additional calcifications are confidently identified projecting over the expected location of the kidneys or ureters bilaterally on this plain film examination.  IMPRESSION: 1. No definite urinary tract calculi identified by plain film examination. Further evaluation with noncontrast CT of the abdomen and pelvis with provide additional diagnostic  information if clinically indicated. 2. Nonobstructive bowel gas pattern. 3. No pneumoperitoneum. 4. Status post cholecystectomy.   Electronically Signed   By: Vinnie Langton M.D.   On: 11/04/2013 15:23    EKG Interpretation   None       MDM   1. Left flank pain   2. Nausea    Clinically kidney stone. Pain meds, fluids. Discussed holding on CT as known hx, clinically stone and risk of radiation/ cost as pt has had CTs in the past, pt agrees.  Pt improved in ED, well appearing.  Vitals unremarkable, benign abdo exam.   Results and differential diagnosis were discussed with the patient. Close follow up outpatient was discussed, patient comfortable with the plan.   Diagnosis: left flank pain      Mariea Clonts, MD 11/04/13 1546

## 2013-11-04 NOTE — ED Notes (Signed)
Pt having left sided flank pain radiating to groin since 3 am.  Some N/V.

## 2013-12-03 LAB — HM MAMMOGRAPHY: HM Mammogram: NORMAL

## 2014-01-05 ENCOUNTER — Telehealth: Payer: Self-pay

## 2014-01-05 ENCOUNTER — Encounter: Payer: Self-pay | Admitting: Family Medicine

## 2014-01-05 ENCOUNTER — Ambulatory Visit (INDEPENDENT_AMBULATORY_CARE_PROVIDER_SITE_OTHER): Payer: Managed Care, Other (non HMO) | Admitting: Family Medicine

## 2014-01-05 VITALS — BP 132/82 | HR 70 | Temp 98.2°F | Resp 16 | Ht 65.75 in | Wt 148.5 lb

## 2014-01-05 DIAGNOSIS — F32A Depression, unspecified: Secondary | ICD-10-CM

## 2014-01-05 DIAGNOSIS — F329 Major depressive disorder, single episode, unspecified: Secondary | ICD-10-CM

## 2014-01-05 DIAGNOSIS — F3289 Other specified depressive episodes: Secondary | ICD-10-CM

## 2014-01-05 MED ORDER — DESVENLAFAXINE ER 50 MG PO TB24: 50.0000 mg | ORAL_TABLET | Freq: Every day | ORAL | Status: DC

## 2014-01-05 NOTE — Progress Notes (Signed)
   Subjective:    Patient ID: Katrina Ford, female    DOB: [birth year], 53 y.o.   MRN: [id number]  HPI New to establish.  Previous MD- Laurann [place], [place]  [facility]  Depression- chronic problem but last bout was 'years' ago.  'i don't know why i'm depressed'.  sxs started a few months ago.  Pt feels this may be related to her hormones (is post-menopausal).  'i feel really bad'.  Very little motivation, frequently crying, nightmares.  Pt denies any significant stressors.  Good relationship w/ husband and kids.  Was medicated previously on Paxil- 'it was strange'.  Has also tried valerian root, xanax.   Review of Systems For ROS see HPI     Objective:   Physical Exam  Vitals reviewed. Constitutional: She is oriented to person, place, and time. She appears well-developed and well-nourished.  tearful  Cardiovascular: Normal rate, regular rhythm, normal heart sounds and intact distal pulses.   Pulmonary/Chest: Effort normal and breath sounds normal. No respiratory distress. She has no wheezes. She has no rales.  Neurological: She is alert and oriented to person, place, and time.  Skin: Skin is warm and dry.  Psychiatric: Her behavior is normal. Thought content normal.  Tearful, anxious          Assessment & Plan:

## 2014-01-05 NOTE — Progress Notes (Signed)
Pre visit review using our clinic review tool, if applicable. No additional management support is needed unless otherwise documented below in the visit note. 

## 2014-01-05 NOTE — Telephone Encounter (Signed)
PA approved for 1 year. Called pharmacy and notified to process.

## 2014-01-05 NOTE — Assessment & Plan Note (Signed)
New to provider, ongoing for pt.  Has previously been on medication and clearly needs to restart.  Did not like how Paxil made her feel.  Since low energy and decreased motivation is one of her main symptoms, will start SNRI.  Reviewed supportive care and red flags that should prompt return.  Pt expressed understanding and is in agreement w/ plan.

## 2014-01-05 NOTE — Patient Instructions (Signed)
Follow up in 1 month to recheck mood Start the Pristiq daily Try and find a stress outlet Call with any questions or concerns Hang in there!!!

## 2014-01-08 NOTE — Telephone Encounter (Signed)
Approval letter received by fax for prior authorization for Pristiq tablets. Approval letter sent to batch for scanning. JG//CMA

## 2014-01-12 ENCOUNTER — Telehealth: Payer: Self-pay | Admitting: *Deleted

## 2014-01-12 NOTE — Telephone Encounter (Signed)
Error

## 2014-02-09 ENCOUNTER — Encounter: Payer: Self-pay | Admitting: Family Medicine

## 2014-02-09 ENCOUNTER — Ambulatory Visit (INDEPENDENT_AMBULATORY_CARE_PROVIDER_SITE_OTHER): Payer: Managed Care, Other (non HMO) | Admitting: Family Medicine

## 2014-02-09 VITALS — BP 140/88 | HR 94 | Temp 98.2°F | Resp 16 | Wt 145.0 lb

## 2014-02-09 DIAGNOSIS — F3289 Other specified depressive episodes: Secondary | ICD-10-CM

## 2014-02-09 DIAGNOSIS — F329 Major depressive disorder, single episode, unspecified: Secondary | ICD-10-CM

## 2014-02-09 DIAGNOSIS — F32A Depression, unspecified: Secondary | ICD-10-CM

## 2014-02-09 MED ORDER — VENLAFAXINE HCL ER 150 MG PO CP24: 150.0000 mg | ORAL_CAPSULE | Freq: Every day | ORAL | Status: DC

## 2014-02-09 NOTE — Patient Instructions (Signed)
Schedule your complete physical in 6-8 weeks Start the Effexor daily Please call me if this is too expensive- we can try and make another switch if needed Call with any questions or concerns [place]!

## 2014-02-09 NOTE — Progress Notes (Signed)
   Subjective:    Patient ID: Katrina Ford, female    DOB: 28-May-1961, 53 y.o.   MRN: [id number]  HPI Depression- pt started Pristiq at last visit.  Feeling 'a lot better'.  Less mood swings, less tearful.  Not sleeping well at night- able to fall asleep but not stay asleep.  Sleeping better but not well.  Pt would like to try increasing medication.   Review of Systems For ROS see HPI     Objective:   Physical Exam  Vitals reviewed. Constitutional: She is oriented to person, place, and time. She appears well-developed and well-nourished. No distress.  Neurological: She is alert and oriented to person, place, and time.  Skin: Skin is warm and dry.  Psychiatric: She has a normal mood and affect. Her behavior is normal. Thought content normal.          Assessment & Plan:

## 2014-02-09 NOTE — Progress Notes (Signed)
Pre visit review using our clinic review tool, if applicable. No additional management support is needed unless otherwise documented below in the visit note. 

## 2014-02-09 NOTE — Assessment & Plan Note (Signed)
Improved.  Pt would like to increase dose but notes that med is $70 even w/ her insurance.  Will switch to generic Effexor and monitor for ongoing symptom improvement.  Pt expressed understanding and is in agreement w/ plan.

## 2014-04-20 ENCOUNTER — Encounter: Payer: Managed Care, Other (non HMO) | Admitting: Family Medicine

## 2014-04-27 ENCOUNTER — Encounter: Payer: Self-pay | Admitting: Family Medicine

## 2014-04-27 ENCOUNTER — Ambulatory Visit (INDEPENDENT_AMBULATORY_CARE_PROVIDER_SITE_OTHER): Payer: 59 | Admitting: Family Medicine

## 2014-04-27 VITALS — BP 128/68 | HR 88 | Temp 98.0°F | Resp 16 | Ht 65.75 in | Wt 149.5 lb

## 2014-04-27 DIAGNOSIS — F3289 Other specified depressive episodes: Secondary | ICD-10-CM

## 2014-04-27 DIAGNOSIS — L989 Disorder of the skin and subcutaneous tissue, unspecified: Secondary | ICD-10-CM

## 2014-04-27 DIAGNOSIS — F32A Depression, unspecified: Secondary | ICD-10-CM

## 2014-04-27 DIAGNOSIS — F329 Major depressive disorder, single episode, unspecified: Secondary | ICD-10-CM

## 2014-04-27 MED ORDER — VENLAFAXINE HCL ER 75 MG PO CP24: 75.0000 mg | ORAL_CAPSULE | Freq: Every day | ORAL | Status: DC

## 2014-04-27 NOTE — Patient Instructions (Signed)
Schedule your complete physical in 3 months Start the new lower dose of Effexor (75mg ) daily Try to STOP touching your scalp Continue the Head and Shoulders- this will help the current lesions heal If no improvement in the next month, call me and we'll set you up w/ derm Call with any questions or concerns I can't wait to see the new haircut!!

## 2014-04-27 NOTE — Progress Notes (Signed)
   Subjective:    Patient ID: Katrina Ford, female    DOB: 04-May-1961, 53 y.o.   MRN: [id number]  HPI Depression- chronic problem, currently on Effexor.  Pt reports sxs have improved but she tends to have psychomotor hyperactivity- bouncing of legs and feet, tremor of hands (new since starting Effexor).  Sleeping better at night.  Skin bumps- currently on scalp.  Somewhat sore, not itching.  'they get big'.  Will drain when picked.     Review of Systems For ROS see HPI     Objective:   Physical Exam  Vitals reviewed. Constitutional: She is oriented to person, place, and time. She appears well-developed and well-nourished. No distress.  Neurological: She is alert and oriented to person, place, and time.  Skin: Skin is warm and dry.  Scattered pustule lesions on scalp consistent w/ picked pimples  Psychiatric: She has a normal mood and affect. Her behavior is normal. Thought content normal.          Assessment & Plan:

## 2014-04-27 NOTE — Assessment & Plan Note (Signed)
New.  Appear to be sweat pimples that are worsening bc pt is persistently picking them.  Encouraged her to keep her hands off her scalp, continue to use Head and Shoulders b/c it contains salicylic acid, and monitor for improvement.  If lesions don't resolve, will refer to derm.  Pt expressed understanding and is in agreement w/ plan.

## 2014-04-27 NOTE — Progress Notes (Signed)
Pre visit review using our clinic review tool, if applicable. No additional management support is needed unless otherwise documented below in the visit note. 

## 2014-04-27 NOTE — Assessment & Plan Note (Signed)
Improved on effexor but due to psychomotor agitation, will decrease dose to 75mg .  Will follow.

## 2014-08-14 ENCOUNTER — Encounter: Payer: Self-pay | Admitting: Family Medicine

## 2014-08-14 ENCOUNTER — Ambulatory Visit (INDEPENDENT_AMBULATORY_CARE_PROVIDER_SITE_OTHER): Payer: 59 | Admitting: Family Medicine

## 2014-08-14 VITALS — BP 120/84 | HR 97 | Temp 97.9°F | Resp 16 | Ht 65.75 in | Wt 153.1 lb

## 2014-08-14 DIAGNOSIS — K2100 Gastro-esophageal reflux disease with esophagitis, without bleeding: Secondary | ICD-10-CM

## 2014-08-14 DIAGNOSIS — R1011 Right upper quadrant pain: Secondary | ICD-10-CM

## 2014-08-14 DIAGNOSIS — Z0000 Encounter for general adult medical examination without abnormal findings: Secondary | ICD-10-CM | POA: Insufficient documentation

## 2014-08-14 DIAGNOSIS — N951 Menopausal and female climacteric states: Secondary | ICD-10-CM

## 2014-08-14 LAB — LIPID PANEL
Cholesterol: 188 mg/dL (ref 0–200)
HDL: 44.7 mg/dL (ref >39.00)
LDL CALC: 123 mg/dL — AB (ref 0–99)
NONHDL: 143.3
Total CHOL/HDL Ratio: 4
Triglycerides: 103 mg/dL (ref 0.0–149.0)
VLDL: 20.6 mg/dL (ref 0.0–40.0)

## 2014-08-14 LAB — HEPATIC FUNCTION PANEL
ALT: 13 U/L (ref 0–35)
AST: 14 U/L (ref 0–37)
Albumin: 4 g/dL (ref 3.5–5.2)
Alkaline Phosphatase: 78 U/L (ref 39–117)
BILIRUBIN DIRECT: 0 mg/dL (ref 0.0–0.3)
BILIRUBIN TOTAL: 0.3 mg/dL (ref 0.2–1.2)
Total Protein: 7.6 g/dL (ref 6.0–8.3)

## 2014-08-14 LAB — CBC WITH DIFFERENTIAL/PLATELET
BASOS PCT: 0.5 % (ref 0.0–3.0)
Basophils Absolute: 0 10*3/uL (ref 0.0–0.1)
EOS ABS: 0.2 10*3/uL (ref 0.0–0.7)
Eosinophils Relative: 2.5 % (ref 0.0–5.0)
HCT: 41.1 % (ref 36.0–46.0)
HEMOGLOBIN: 13.5 g/dL (ref 12.0–15.0)
Lymphocytes Relative: 31.2 % (ref 12.0–46.0)
Lymphs Abs: 2 10*3/uL (ref 0.7–4.0)
MCHC: 32.9 g/dL (ref 30.0–36.0)
MCV: 91.3 fl (ref 78.0–100.0)
MONO ABS: 0.4 10*3/uL (ref 0.1–1.0)
Monocytes Relative: 6.9 % (ref 3.0–12.0)
NEUTROS ABS: 3.7 10*3/uL (ref 1.4–7.7)
Neutrophils Relative %: 58.9 % (ref 43.0–77.0)
Platelets: 305 10*3/uL (ref 150.0–400.0)
RBC: 4.5 Mil/uL (ref 3.87–5.11)
RDW: 12.8 % (ref 11.5–15.5)
WBC: 6.4 10*3/uL (ref 4.0–10.5)

## 2014-08-14 LAB — BASIC METABOLIC PANEL
BUN: 11 mg/dL (ref 6–23)
CALCIUM: 9.3 mg/dL (ref 8.4–10.5)
CO2: 26 mEq/L (ref 19–32)
Chloride: 107 mEq/L (ref 96–112)
Creatinine, Ser: 0.7 mg/dL (ref 0.4–1.2)
GFR: 92.89 mL/min (ref >60.00)
Glucose, Bld: 90 mg/dL (ref 70–99)
Potassium: 3.9 mEq/L (ref 3.5–5.1)
SODIUM: 140 meq/L (ref 135–145)

## 2014-08-14 LAB — TSH: TSH: 0.7 u[IU]/mL (ref 0.35–4.50)

## 2014-08-14 LAB — VITAMIN D 25 HYDROXY (VIT D DEFICIENCY, FRACTURES): VITD: 22.72 ng/mL — ABNORMAL LOW (ref 30.00–100.00)

## 2014-08-14 MED ORDER — ESOMEPRAZOLE MAGNESIUM 40 MG PO CPDR: DELAYED_RELEASE_CAPSULE | ORAL | Status: DC

## 2014-08-14 NOTE — Assessment & Plan Note (Signed)
Ongoing problem for pt.  Hx of constipation but due to duration of RUQ will get [place] and refer to GI.  Pt expressed understanding and is in agreement w/ plan.

## 2014-08-14 NOTE — Patient Instructions (Signed)
Follow up in 1 year or as needed We'll notify you of your lab results and make any changes if needed We'll call you with your abdominal US and GI appt Restart the Nexium daily Please look up [facility] and [place] for vaginal dryness Call with any questions or concerns Keep up the good work!  You look great!

## 2014-08-14 NOTE — Assessment & Plan Note (Signed)
Pt's PE WNL.  UTD on health maintenance.  Check labs.  Anticipatory guidance provided.  

## 2014-08-14 NOTE — Assessment & Plan Note (Signed)
New.  Pt not interested in vaginal estrogen.  Has had this previously and 'it's too messy'.  Encouraged pt to look up info on both [place].  Discussed both drugs.  Pt to let me know if she is interested in starting either.

## 2014-08-14 NOTE — Progress Notes (Signed)
Pre visit review using our clinic review tool, if applicable. No additional management support is needed unless otherwise documented below in the visit note. 

## 2014-08-14 NOTE — Assessment & Plan Note (Signed)
Deteriorated since starting OTC Nexium.  Resume prescription dose.  Script provided.

## 2014-08-14 NOTE — Progress Notes (Signed)
   Subjective:    Patient ID: Katrina Ford, female    DOB: 1961/07/31, 53 y.o.   MRN: [id number]  HPI CPE- UTD on colonoscopy, mammo, and pap.  Only concern today is RUQ pain.  Intermittent.  Present x2 months.  No gallbladder.  + constipation.  No relief w/ BM.  GI- [facility]   Review of Systems Patient reports no vision/ hearing changes, adenopathy,fever, weight change,  persistant/recurrent hoarseness , swallowing issues, chest pain, palpitations, edema, persistant/recurrent cough, hemoptysis, dyspnea (rest/exertional/paroxysmal nocturnal), gastrointestinal bleeding (melena, rectal bleeding), GU symptoms (dysuria, hematuria, incontinence),  syncope, focal weakness, memory loss, numbness & tingling, skin/hair/nail changes, abnormal bruising or bleeding, anxiety, or depression.   + RUQ pain, see above  + severe GERD + constipation + severe vaginal dryness    Objective:   Physical Exam General Appearance:    Alert, cooperative, no distress, appears stated age  Head:    Normocephalic, without obvious abnormality, atraumatic  Eyes:    PERRL, conjunctiva/corneas clear, EOM's intact, fundi    benign, both eyes  Ears:    Normal TM's and external ear canals, both ears  Nose:   Nares normal, septum midline, mucosa normal, no drainage    or sinus tenderness  Throat:   Lips, mucosa, and tongue normal; teeth and gums normal  Neck:   Supple, symmetrical, trachea midline, no adenopathy;    Thyroid: no enlargement/tenderness/nodules  Back:     Symmetric, no curvature, ROM normal, no CVA tenderness  Lungs:     Clear to auscultation bilaterally, respirations unlabored  Chest Wall:    No tenderness or deformity   Heart:    Regular rate and rhythm, S1 and S2 normal, no murmur, rub   or gallop  Breast Exam:    Deferred to GYN  Abdomen:     Soft, non-tender, bowel sounds active all four quadrants,    no masses, no organomegaly  Genitalia:    Deferred to GYN  Rectal:    Extremities:   Extremities  normal, atraumatic, no cyanosis or edema  Pulses:   2+ and symmetric all extremities  Skin:   Skin color, texture, turgor normal, no rashes or lesions  Lymph nodes:   Cervical, supraclavicular, and axillary nodes normal  Neurologic:   CNII-XII intact, normal strength, sensation and reflexes    throughout          Assessment & Plan:

## 2014-08-15 ENCOUNTER — Telehealth: Payer: Self-pay | Admitting: *Deleted

## 2014-08-15 ENCOUNTER — Other Ambulatory Visit: Payer: Self-pay | Admitting: General Practice

## 2014-08-15 MED ORDER — VITAMIN D (ERGOCALCIFEROL) 1.25 MG (50000 UNIT) PO CAPS: 50000.0000 [IU] | ORAL_CAPSULE | ORAL | Status: DC

## 2014-08-15 NOTE — Telephone Encounter (Signed)
Prior authorization for Esomeprazole magnesium 20mg  DR capsules initiated on [email or web address]. JG//CMA

## 2014-08-16 ENCOUNTER — Ambulatory Visit (HOSPITAL_BASED_OUTPATIENT_CLINIC_OR_DEPARTMENT_OTHER)
Admission: RE | Admit: 2014-08-16 | Discharge: 2014-08-16 | Disposition: A | Discharge status: Home / Self Care | Payer: 59 | Source: Ambulatory Visit | Attending: Family Medicine | Admitting: Family Medicine

## 2014-08-16 DIAGNOSIS — R1011 Right upper quadrant pain: Secondary | ICD-10-CM | POA: Diagnosis present

## 2014-08-17 ENCOUNTER — Encounter: Payer: Self-pay | Admitting: General Practice

## 2014-08-20 ENCOUNTER — Encounter: Payer: Self-pay | Admitting: General Practice

## 2014-08-21 ENCOUNTER — Telehealth: Payer: Self-pay | Admitting: Family Medicine

## 2014-08-21 NOTE — Telephone Encounter (Signed)
Pt states her insurance denied the rx for esomeprazole (NEXIUM) 40 MG capsule Pt would like to know what alternatives she has.

## 2014-08-22 MED ORDER — PANTOPRAZOLE SODIUM 40 MG PO TBEC: 40.0000 mg | DELAYED_RELEASE_TABLET | Freq: Every day | ORAL | Status: DC

## 2014-08-22 NOTE — Telephone Encounter (Signed)
Caller name: Katrina Ford  Call back number:[phone] Pharmacy:[id number] - [place], [place] - [address] AT [address]   Reason for call:  Pt does not want the Nexium, even with the PA, it is still 200$  Pt wants to know her alternatives, like Protonix.   If this can be changed to Protonix or to something Dr. Birdie Riddle recommends, please call to pharmacy.   Pt only wants Dr. Virgil Benedict CMA to call for clarification.

## 2014-08-22 NOTE — Telephone Encounter (Signed)
Ok to switch to protonix 40mg  daily

## 2014-08-22 NOTE — Telephone Encounter (Signed)
protonix filled and pt notified.

## 2014-09-06 ENCOUNTER — Encounter: Payer: Self-pay | Admitting: Gastroenterology

## 2014-10-11 ENCOUNTER — Encounter: Payer: Self-pay | Admitting: Gastroenterology

## 2014-10-11 ENCOUNTER — Ambulatory Visit (INDEPENDENT_AMBULATORY_CARE_PROVIDER_SITE_OTHER): Payer: 59 | Admitting: Gastroenterology

## 2014-10-11 VITALS — BP 126/64 | HR 88 | Ht 65.75 in | Wt 159.4 lb

## 2014-10-11 DIAGNOSIS — K589 Irritable bowel syndrome without diarrhea: Secondary | ICD-10-CM

## 2014-10-11 DIAGNOSIS — K219 Gastro-esophageal reflux disease without esophagitis: Secondary | ICD-10-CM

## 2014-10-11 NOTE — Patient Instructions (Signed)
Research is going to speak with you today

## 2014-10-11 NOTE — Assessment & Plan Note (Signed)
Symptoms are well-controlled with Protonix

## 2014-10-11 NOTE — Assessment & Plan Note (Signed)
Patient has constipation predominant IBS that I believe is responsible for abdominal pain as well.  She did not tolerate Linzess.  Medications #1 patient will consider enrollment in a IBS trial.  Failing this, I would consider a trial of Amitiza

## 2014-10-11 NOTE — Progress Notes (Signed)
      History of Present Illness:  Katrina Ford is a 53 year old white female formerly followed by Dr. Verl Ford with history of IBS, GERD and depression here for evaluation of abdominal pain and constipation.  She typically moves her bowels 2-3 times a week but bowels are very small volume and she has the sense of incomplete evacuation.  With constipation she has pain, mostly in the right upper quadrant that is only partially relieved with defecation.  Recent abdominal ultrasound and LFTs were unrevealing.  Last colonoscopy in 2012 was normal and upper endoscopy 2011 was unremarkable.  Linzess caused severe diarrhea.    Review of Systems: Pertinent positive and negative review of systems were noted in the above HPI section. All other review of systems were otherwise negative.    Current Medications, Allergies, Past Medical History, Past Surgical History, Family History and Social History were reviewed in [facility] record  Vital signs were reviewed in today's medical record. Physical Exam: General: Well developed , well nourished, no acute distress Skin: anicteric Head: Normocephalic and atraumatic Eyes:  sclerae anicteric, EOMI Ears: Normal auditory acuity Mouth: No deformity or lesions Lungs: Clear throughout to auscultation Heart: Regular rate and rhythm; no murmurs, rubs or bruits Abdomen: Soft, non tender and non distended. No masses, hepatosplenomegaly or hernias noted. Normal Bowel sounds Rectal:deferred Musculoskeletal: Symmetrical with no gross deformities  Pulses:  Normal pulses noted Extremities: No clubbing, cyanosis, edema or deformities noted Neurological: Alert oriented x 4, grossly nonfocal Psychological:  Alert and cooperative. Normal mood and affect  See Assessment and Plan under Problem List

## 2014-11-13 ENCOUNTER — Ambulatory Visit: Payer: 59 | Attending: Family Medicine

## 2014-11-13 DIAGNOSIS — M25511 Pain in right shoulder: Secondary | ICD-10-CM | POA: Insufficient documentation

## 2014-11-13 DIAGNOSIS — M25611 Stiffness of right shoulder, not elsewhere classified: Secondary | ICD-10-CM | POA: Insufficient documentation

## 2014-11-13 DIAGNOSIS — M542 Cervicalgia: Secondary | ICD-10-CM | POA: Diagnosis not present

## 2014-11-15 ENCOUNTER — Ambulatory Visit: Payer: 59 | Admitting: Rehabilitation

## 2014-11-15 DIAGNOSIS — M25511 Pain in right shoulder: Secondary | ICD-10-CM | POA: Diagnosis not present

## 2014-11-20 ENCOUNTER — Ambulatory Visit: Payer: 59 | Admitting: Rehabilitation

## 2014-12-03 ENCOUNTER — Ambulatory Visit: Payer: 59

## 2014-12-06 ENCOUNTER — Ambulatory Visit: Payer: 59 | Admitting: Rehabilitation

## 2014-12-07 ENCOUNTER — Telehealth: Payer: Self-pay

## 2014-12-07 NOTE — Telephone Encounter (Signed)
-----   Message from Inda Castle, MD sent at 12/05/2014  4:04 PM EST ----- Regarding: RE: Follow up Contact: [phone] Yes, try Amitiza 24 g twice a day ----- Message -----    From: Virgina Evener, LPN    Sent: 02/01/6383   2:29 PM      To: Inda Castle, MD Subject: FW: Follow up                                  She did not tolerate Linzess. Your note mentions a trial of Amitiza. I will call this patient and explain.  ----- Message -----    From: Maury Dus, RN    Sent: 12/05/2014  10:27 AM      To: Virgina Evener, LPN Subject: FW: Follow up                                    ----- Message -----    From: Orlie Pollen    Sent: 12/05/2014  10:23 AM      To: Maury Dus, RN Subject: Follow up                                      This patient was referred by Dr. Deatra Ina for a constipation study. Subject has been placed on a pain medication. This is an exclusion for the study. Please follow up with patient for constipation and GERD. Thanks

## 2014-12-07 NOTE — Telephone Encounter (Signed)
Left message for the patient to  Call if she is still having any issues with constipation.

## 2014-12-10 ENCOUNTER — Ambulatory Visit: Payer: 59 | Attending: Family Medicine

## 2014-12-10 DIAGNOSIS — M25611 Stiffness of right shoulder, not elsewhere classified: Secondary | ICD-10-CM | POA: Diagnosis not present

## 2014-12-10 DIAGNOSIS — M542 Cervicalgia: Secondary | ICD-10-CM | POA: Insufficient documentation

## 2014-12-10 DIAGNOSIS — M25511 Pain in right shoulder: Secondary | ICD-10-CM | POA: Insufficient documentation

## 2014-12-13 ENCOUNTER — Ambulatory Visit: Payer: 59 | Admitting: Rehabilitation

## 2014-12-13 DIAGNOSIS — M25511 Pain in right shoulder: Secondary | ICD-10-CM | POA: Diagnosis not present

## 2014-12-17 ENCOUNTER — Ambulatory Visit: Payer: 59 | Admitting: Rehabilitation

## 2014-12-18 ENCOUNTER — Encounter: Payer: Self-pay | Admitting: Family Medicine

## 2014-12-18 ENCOUNTER — Ambulatory Visit (INDEPENDENT_AMBULATORY_CARE_PROVIDER_SITE_OTHER): Payer: 59 | Admitting: Family Medicine

## 2014-12-18 VITALS — BP 140/80 | HR 98 | Temp 98.2°F | Resp 16 | Wt 165.5 lb

## 2014-12-18 DIAGNOSIS — J0100 Acute maxillary sinusitis, unspecified: Secondary | ICD-10-CM

## 2014-12-18 DIAGNOSIS — R300 Dysuria: Secondary | ICD-10-CM | POA: Insufficient documentation

## 2014-12-18 LAB — POCT URINALYSIS DIPSTICK
Bilirubin, UA: NEGATIVE
Blood, UA: NEGATIVE
Glucose, UA: NEGATIVE
Ketones, UA: NEGATIVE
Leukocytes, UA: NEGATIVE
Nitrite, UA: NEGATIVE
PH UA: 5.5
PROTEIN UA: NEGATIVE
Spec Grav, UA: >=1.03
Urobilinogen, UA: 0.2

## 2014-12-18 MED ORDER — SULFAMETHOXAZOLE-TRIMETHOPRIM 800-160 MG PO TABS: 1.0000 | ORAL_TABLET | Freq: Two times a day (BID) | ORAL | Status: DC

## 2014-12-18 MED ORDER — BENZONATATE 200 MG PO CAPS: 200.0000 mg | ORAL_CAPSULE | Freq: Three times a day (TID) | ORAL | Status: DC | PRN

## 2014-12-18 MED ORDER — PROMETHAZINE-DM 6.25-15 MG/5ML PO SYRP: 5.0000 mL | ORAL_SOLUTION | Freq: Four times a day (QID) | ORAL | Status: DC | PRN

## 2014-12-18 NOTE — Assessment & Plan Note (Signed)
New.  Pt's sxs and PE consistent w/ infxn.  Start Bactrim to treat sinusitis, bronchitis, and UTI.  Cough meds prn.  Reviewed supportive care and red flags that should prompt return.  Pt expressed understanding and is in agreement w/ plan.

## 2014-12-18 NOTE — Progress Notes (Signed)
Pre visit review using our clinic review tool, if applicable. No additional management support is needed unless otherwise documented below in the visit note. 

## 2014-12-18 NOTE — Progress Notes (Signed)
   Subjective:    Patient ID: Katrina Ford, female    DOB: [birth year], 54 y.o.   MRN: [id number]  HPI Cough- sxs started ~2 weeks ago.  Pt reports temporary improvement but things are again worsening.  No fevers.  Cough is rarely productive.  Some wheezing w/ laughing.  Denies sinus pain/pressure.  No ear pain.  No N/V.  Dysuria- sxs started while pt was on vacation in [place].  + urgency, frequency.  No blood in urine.  No pain consistent w/ kidney stones.  Review of Systems For ROS see HPI     Objective:   Physical Exam  Constitutional: She appears well-developed and well-nourished. No distress.  HENT:  Head: Normocephalic and atraumatic.  Right Ear: Tympanic membrane normal.  Left Ear: Tympanic membrane normal.  Nose: Mucosal edema and rhinorrhea present. Right sinus exhibits maxillary sinus tenderness and frontal sinus tenderness. Left sinus exhibits maxillary sinus tenderness and frontal sinus tenderness.  Mouth/Throat: Uvula is midline and mucous membranes are normal. Posterior oropharyngeal erythema present. No oropharyngeal exudate.  Eyes: Conjunctivae and EOM are normal. Pupils are equal, round, and reactive to light.  Neck: Normal range of motion. Neck supple.  Cardiovascular: Normal rate, regular rhythm and normal heart sounds.   Pulmonary/Chest: Effort normal and breath sounds normal. No respiratory distress. She has no wheezes.  Abdominal: Soft. Bowel sounds are normal. She exhibits no distension. There is no tenderness. There is no rebound.  Lymphadenopathy:    She has no cervical adenopathy.  Vitals reviewed.         Assessment & Plan:

## 2014-12-18 NOTE — Patient Instructions (Signed)
Follow up as needed Start the Bactrim twice daily for 10 days to treat likely UTI, bronchitis, and sinusitis Use the cough syrup for nights and weekends- will cause drowsiness Use the cough pills for daytime cough- can use in combination w/ mucinex DM Drink plenty of fluids REST! Call with any questions or concerns Hang in there!

## 2014-12-18 NOTE — Addendum Note (Signed)
Addended by: Peggyann Shoals on: 12/18/2014 12:27 PM   Modules accepted: Orders

## 2014-12-18 NOTE — Assessment & Plan Note (Signed)
New to provider.  sxs started while pt was in [place].  Despite negative UA, will send for cx and start Bactrim based on sxs.

## 2014-12-20 ENCOUNTER — Ambulatory Visit: Payer: 59 | Admitting: Rehabilitation

## 2014-12-20 LAB — URINE CULTURE
Colony Count: NO GROWTH
Organism ID, Bacteria: NO GROWTH

## 2014-12-24 ENCOUNTER — Other Ambulatory Visit: Payer: Self-pay | Admitting: Family Medicine

## 2014-12-24 ENCOUNTER — Ambulatory Visit: Payer: 59

## 2014-12-24 DIAGNOSIS — M25511 Pain in right shoulder: Secondary | ICD-10-CM

## 2014-12-24 DIAGNOSIS — M25611 Stiffness of right shoulder, not elsewhere classified: Secondary | ICD-10-CM

## 2014-12-24 DIAGNOSIS — M542 Cervicalgia: Secondary | ICD-10-CM

## 2014-12-24 NOTE — Therapy (Signed)
[facility] [address]  [place], [place], [address] Phone: [phone]   Fax:  [phone]  Physical Therapy Treatment  Patient Details  Name: Katrina Ford MRN: [id number] Date of Birth: 11/20/1960 Referring Provider:  Gentry Fitz, MD  Encounter Date: 12/24/2014      PT End of Session - 12/24/14 1652    Visit Number 6   Number of Visits 12   Date for PT Re-Evaluation 01/04/15   PT Start Time 1559   PT Stop Time 1654   PT Time Calculation (min) 55 min   Activity Tolerance Patient tolerated treatment well   Behavior During Therapy [facility] for tasks assessed/performed      Past Medical History  Diagnosis Date  . Gastritis   . Chronic headache   . Hiatal hernia   . GERD (gastroesophageal reflux disease)   . Family history of malignant neoplasm of gastrointestinal tract   . IBS (irritable bowel syndrome)   . Kidney stones   . Depression     Past Surgical History  Procedure Laterality Date  . Tonsillectomy    . Cholecystectomy  2000  . Lithotripsy  [id number]  . Esophageal manometry  11/28/2012    Procedure: ESOPHAGEAL MANOMETRY (EM);  Surgeon: Sable Feil, MD;  Location: [facility];  Service: Endoscopy;  Laterality: N/A;    There were no vitals taken for this visit.  Visit Diagnosis:  Right shoulder pain  Limitation of joint motion of right shoulder  Cervicalgia      Subjective Assessment - 12/24/14 1559    Symptoms Feels able to lift arm a little more.  Been doing nerve glides at home.  yesterday felt a "pop" in top of shoulder and felt able to lift arm a little higher.     Currently in Pain? Yes   Pain Score 5    Pain Location Neck   Pain Orientation Right   Pain Descriptors / Indicators Sharp;Shooting;Sore   Pain Type Acute pain   Pain Radiating Towards from right shoulder up lateral aspect of neck on right side    Pain Onset More than a month ago   Pain Frequency Constant    Aggravating Factors  moving and laying on that side   Pain Relieving Factors Not moving, heat, hot shower   Effect of Pain on Daily Activities interrupts sleep, unable to lift much with that arm          [facility] PT Assessment - 12/24/14 0001    ROM / Strength   AROM / PROM / Strength AROM;PROM   AROM   AROM Assessment Site Shoulder   Right/Left Shoulder Right   Right Shoulder Flexion 110 Degrees   Right Shoulder ABduction 72 Degrees   PROM   PROM Assessment Site Shoulder   Right/Left Shoulder Right   Right Shoulder Flexion 130 Degrees   Right Shoulder ABduction 100 Degrees                  [facility] Adult PT Treatment/Exercise - 12/24/14 0001    Exercises   Exercises Shoulder;Neck   Neck Exercises: Supine   Neck Retraction 5 reps;5 secs   Shoulder Exercises: Seated   Elevation AAROM;Right;10 reps  seated wtih hands on green therapy ball rolling out forward   Other Seated Exercises sitting table top flexion x 5 with cues   Shoulder Exercises: Standing   Other Standing Exercises pendulums x 10 cw/ccw   Shoulder Exercises: Pulleys  Flexion 3 minutes   Shoulder Exercises: Stretch   Corner Stretch 2 reps;20 seconds  with cues for head position and hold time   Table Stretch - Flexion 5 reps   Table Stretch -Flexion Limitations 5 reps with cues sitting   Modalities   Modalities Electrical Stimulation;Moist Heat   Moist Heat Therapy   Number Minutes Moist Heat 15 Minutes   Moist Heat Location Shoulder;Other (comment)  lateral cervical area   Electrical Stimulation   Electrical Stimulation Location right lateral cervical area, periscapular and lateral arm   Electrical Stimulation Action IFC   Electrical Stimulation Goals Pain   Manual Therapy   Manual Therapy Passive ROM;Manual Traction;Myofascial release   Myofascial Release to right lateral flexors and upper trap   Passive ROM stretch to right Upper trap   Manual Traction sub occipital release 2x 1 minute holds                 PT Education - 12/24/14 1651    Education provided Yes   Education Details HEP in pt instructions   Person(s) Educated Patient   Methods Explanation;Demonstration;Handout   Comprehension Verbalized understanding;Returned demonstration          PT Short Term Goals - 12/24/14 1718    PT SHORT TERM GOAL #1   Title Patient independent in initial HEP. (12/14/14)   Time 3   Period Weeks   Status Achieved   PT SHORT TERM GOAL #2   Title AROM right shoulder flexion at least 145 for improved ADL's (12/14/14)   Time 3   Period Weeks   Status On-going   PT SHORT TERM GOAL #3   Title Report sleep imiprovements by 25% (12/14/14)   Time 3   Period Weeks   Status On-going           PT Long Term Goals - 12/24/14 1719    PT LONG TERM GOAL #1   Title demonstrate technique to reduce risk of re-injury to include info on posture, body mechanics, lifting.  (01/04/15)   Time 6   Period Weeks   Status On-going   PT LONG TERM GOAL #2   Title be independent with advanced HEP (01/04/15)   Time 6   Period Weeks   Status On-going   PT LONG TERM GOAL #3   Title report pain decrease with daily activities to no more than 3-4/10  (01/04/15)   Time 6   Period Weeks   Status On-going   PT LONG TERM GOAL #4   Title increase ROM right shoulder to at least 160 for improved ADL/IADL  (01/04/15)   Time 6   Period Weeks   Status On-going   PT LONG TERM GOAL #5   Title report sleep improved by 50%  (01/04/15)   Time 6   Period Weeks   Status On-going   Additional Long Term Goals   Additional Long Term Goals Yes   PT LONG TERM GOAL #6   Title strength improved right shoulder elevation to at least 4+/5 for improved ADL/IADL's  (01/04/15)   Time 6   Period Weeks   Status On-going               Plan - 12/24/14 1716    Clinical Impression Statement Patient demonstrating loss of ROM though reporting feels it is improved.  May be starting frozen shouder so started [facility] for HEP  today.  Also reports pain relief with nerve glides.  Needs continued skilled PT to progress  to goals   Pt will benefit from skilled therapeutic intervention in order to improve on the following deficits Decreased range of motion;Impaired flexibility;Improper body mechanics;Postural dysfunction;Increased fascial restricitons;Decreased activity tolerance;Increased muscle spasms;Pain;Impaired UE functional use;Decreased strength;Decreased mobility   PT Frequency 2x / week   PT Duration 6 weeks   PT Treatment/Interventions ADLs/Self Care Home Management;Traction;Neuromuscular re-education;Ultrasound;Functional mobility training;Patient/family education;Passive range of motion;Cryotherapy;Therapeutic activities;Electrical Stimulation;Therapeutic exercise;Manual techniques;Moist Heat   PT Next Visit Plan Review HEP PROM shoulder as tolerated, continue manual to neck   Consulted and Agree with Plan of Care Patient        Problem List Patient Active Problem List   Diagnosis Date Noted  . Dysuria 12/18/2014  . Acute maxillary sinusitis 12/18/2014  . Physical exam 08/14/2014  . Menopausal vaginal dryness 08/14/2014  . Skin lesion of scalp 04/27/2014  . Depression 01/05/2014  . Special screening for malignant neoplasms, colon 10/05/2011  . Abdominal pain, RLQ (right lower quadrant) 10/05/2011  . IBS (irritable bowel syndrome) 10/05/2011  . Abdominal pain 10/01/2011  . GERD (gastroesophageal reflux disease) 10/01/2011  . S/P cholecystectomy 10/01/2011  . Constipation, slow transit 10/01/2011  . Abnormal liver enzymes 10/01/2011  . IRRITABLE BOWEL SYNDROME 07/18/2010  . GERD 12/01/2007  . HIATAL HERNIA 12/01/2007  . HEADACHE, CHRONIC 12/01/2007  . GASTRITIS 07/27/2000    WYNN,CYNDI 12/24/2014, 5:23 PM  Magda Kiel, [place] 12/24/2014   [facility] [address]  [place], [place], [address] Phone: [phone]   Fax:   [phone]

## 2014-12-24 NOTE — Patient Instructions (Signed)
ROM: Pendulum (Circular)   Let right arm move in circle clockwise, then counterclockwise, by rocking body weight in circular pattern. Circle __10_ times each direction per set. Do _1__ sets per session. Do ___2_ sessions per day.  http://orth.exer.us/794   Copyright  VHI. All rights reserved.  ROM: Pendulum (Circular)   Copyright  VHI. All rights reserved.  ROM: Flexion   Keeping left arm on table, slide body away until stretch is felt. Hold _5__ seconds. Repeat _10___ times per set. Do __1__ sets per session. Do _1-2___ sessions per day.  http://orth.exer.us/756   Copyright  VHI. All rights reserved.  ROM: Flexion   Copyright  VHI. All rights reserved.  ROM: Abduction   With left arm resting on table, palm up, bring head down toward arm and simultaneously move trunk away from table. Hold _5___ seconds.  Repeat _10___ times per set. Do _1__sets per session. Do _1-2___ sessions per day.  http://orth.exer.us/760   Copyright  VHI. All rights reserved.  ROM: Abduction  Copyright  VHI. All rights reserved.  Flexibility: Neck Retraction   Pull head straight back, keeping eyes and jaw level.  Hold 5 seconds Do lying with head on a pillow  Repeat __10__ times per set. Do __1__ sets per session. Do __2__ sessions per day.  http://orth.exer.us/344   Copyright  VHI. All rights reserved.  Flexibility: Neck Retraction

## 2014-12-25 ENCOUNTER — Telehealth: Payer: Self-pay | Admitting: Family Medicine

## 2014-12-25 ENCOUNTER — Ambulatory Visit: Payer: 59 | Admitting: Medical

## 2014-12-25 NOTE — Telephone Encounter (Signed)
Pt canceled appointment for today would like to take the rest of her antibiotics

## 2014-12-25 NOTE — Telephone Encounter (Signed)
Med filled.  

## 2014-12-26 ENCOUNTER — Encounter: Payer: Self-pay | Admitting: Family Medicine

## 2014-12-27 ENCOUNTER — Ambulatory Visit: Payer: 59 | Admitting: Rehabilitation

## 2014-12-27 DIAGNOSIS — M25511 Pain in right shoulder: Secondary | ICD-10-CM

## 2014-12-27 DIAGNOSIS — M25611 Stiffness of right shoulder, not elsewhere classified: Secondary | ICD-10-CM

## 2014-12-27 NOTE — Therapy (Signed)
[facility] [address]  [place], [place], [address] Phone: [phone]   Fax:  [phone]  Physical Therapy Treatment  Patient Details  Name: Katrina Ford MRN: [id number] Date of Birth: [birth year] Referring Provider:  Gentry Fitz, MD  Encounter Date: 12/27/2014      PT End of Session - 12/27/14 1701    Visit Number 7   Number of Visits 12   Date for PT Re-Evaluation 01/04/15   PT Start Time 2876   PT Stop Time 1713   PT Time Calculation (min) 58 min   Activity Tolerance Patient tolerated treatment well   Behavior During Therapy [facility] for tasks assessed/performed      Past Medical History  Diagnosis Date  . Gastritis   . Chronic headache   . Hiatal hernia   . GERD (gastroesophageal reflux disease)   . Family history of malignant neoplasm of gastrointestinal tract   . IBS (irritable bowel syndrome)   . Kidney stones   . Depression     Past Surgical History  Procedure Laterality Date  . Tonsillectomy    . Cholecystectomy  2000  . Lithotripsy  [id number]  . Esophageal manometry  11/28/2012    Procedure: ESOPHAGEAL MANOMETRY (EM);  Surgeon: Sable Feil, MD;  Location: [facility];  Service: Endoscopy;  Laterality: N/A;    There were no vitals taken for this visit.  Visit Diagnosis:  Right shoulder pain  Limitation of joint motion of right shoulder      Subjective Assessment - 12/27/14 1616    Symptoms Was feeling ok until today then the pain came back in her shoulder. Hurt after last time. Neck is feeling much better today. Noted one instance of neck pain today but was fine other than that.    Pain Score 5    Pain Location Shoulder   Pain Orientation Right                    [facility] Adult PT Treatment/Exercise - 12/27/14 0001    Shoulder Exercises: Seated   Elevation AAROM  Both hands on green physioball x 5 middle/Rt/Lt   Shoulder Exercises: Standing   Extension 10  reps;AROM;Both   Theraband Level (Shoulder Extension) Level 2 (Red)   Row 10 reps;Both;AROM   Theraband Level (Shoulder Row) Level 2 (Red)   Other Standing Exercises pendulums x 10 cw/ccw   Shoulder Exercises: Pulleys   Flexion 3 minutes   Shoulder Exercises: ROM/Strengthening   Ball on Wall circles at 90 degrees flexion 2x10 cw and ccw   Other ROM/Strengthening Exercises wall ladder flexion x6   Other ROM/Strengthening Exercises pball roll up wall x6   Shoulder Exercises: Stretch   Corner Stretch 20 seconds;3 reps   Modalities   Modalities Moist Heat   Moist Heat Therapy   Number Minutes Moist Heat 15 Minutes   Moist Heat Location Shoulder  in supine   Manual Therapy   Manual Therapy Passive ROM;Joint mobilization;Massage;Other (comment)   Joint Mobilization gentle distraction with occiations   Massage to Rt pec and bicep due to tight and tender   Passive ROM to Rt shoulder to patient tolerance   Other Manual Therapy Rt median nerve glides                  PT Short Term Goals - 12/24/14 1718    PT SHORT TERM GOAL #1   Title Patient independent in initial HEP. (12/14/14)  Time 3   Period Weeks   Status Achieved   PT SHORT TERM GOAL #2   Title AROM right shoulder flexion at least 145 for improved ADL's (12/14/14)   Time 3   Period Weeks   Status On-going   PT SHORT TERM GOAL #3   Title Report sleep imiprovements by 25% (12/14/14)   Time 3   Period Weeks   Status On-going           PT Long Term Goals - 12/24/14 1719    PT LONG TERM GOAL #1   Title demonstrate technique to reduce risk of re-injury to include info on posture, body mechanics, lifting.  (01/04/15)   Time 6   Period Weeks   Status On-going   PT LONG TERM GOAL #2   Title be independent with advanced HEP (01/04/15)   Time 6   Period Weeks   Status On-going   PT LONG TERM GOAL #3   Title report pain decrease with daily activities to no more than 3-4/10  (01/04/15)   Time 6   Period Weeks    Status On-going   PT LONG TERM GOAL #4   Title increase ROM right shoulder to at least 160 for improved ADL/IADL  (01/04/15)   Time 6   Period Weeks   Status On-going   PT LONG TERM GOAL #5   Title report sleep improved by 50%  (01/04/15)   Time 6   Period Weeks   Status On-going   Additional Long Term Goals   Additional Long Term Goals Yes   PT LONG TERM GOAL #6   Title strength improved right shoulder elevation to at least 4+/5 for improved ADL/IADL's  (01/04/15)   Time 6   Period Weeks   Status On-going               Plan - 12/27/14 1701    Clinical Impression Statement Patient still limited with PROM but seemed to tolerate more AAROM in standing with improved motion. Patient does have a difficult time relazxing, causing her to keep her shoulder guarded. Very tight and tender pec and bicep area. which patient reported some relief with.     Pt will benefit from skilled therapeutic intervention in order to improve on the following deficits Decreased range of motion;Improper body mechanics;Postural dysfunction;Increased fascial restricitons;Decreased activity tolerance;Increased muscle spasms;Pain;Impaired UE functional use;Decreased mobility;Decreased strength   Rehab Potential Good   PT Next Visit Plan Continue with nerve glides and STM to Rt pec area. Continue AAROM-AROM exercises.    Consulted and Agree with Plan of Care Patient        Problem List Patient Active Problem List   Diagnosis Date Noted  . Dysuria 12/18/2014  . Acute maxillary sinusitis 12/18/2014  . Physical exam 08/14/2014  . Menopausal vaginal dryness 08/14/2014  . Skin lesion of scalp 04/27/2014  . Depression 01/05/2014  . Special screening for malignant neoplasms, colon 10/05/2011  . Abdominal pain, RLQ (right lower quadrant) 10/05/2011  . IBS (irritable bowel syndrome) 10/05/2011  . Abdominal pain 10/01/2011  . GERD (gastroesophageal reflux disease) 10/01/2011  . S/P cholecystectomy 10/01/2011  .  Constipation, slow transit 10/01/2011  . Abnormal liver enzymes 10/01/2011  . IRRITABLE BOWEL SYNDROME 07/18/2010  . GERD 12/01/2007  . HIATAL HERNIA 12/01/2007  . HEADACHE, CHRONIC 12/01/2007  . GASTRITIS 07/27/2000    Barbette Hair, PTA 12/27/2014, 5:12 PM  [facility] [address]  [address] [place]  [place], [place], [address] Phone: [phone]   Fax:  [phone]

## 2015-01-02 ENCOUNTER — Ambulatory Visit: Payer: 59 | Attending: Family Medicine | Admitting: Rehabilitation

## 2015-01-02 DIAGNOSIS — M542 Cervicalgia: Secondary | ICD-10-CM | POA: Diagnosis not present

## 2015-01-02 DIAGNOSIS — M25611 Stiffness of right shoulder, not elsewhere classified: Secondary | ICD-10-CM | POA: Insufficient documentation

## 2015-01-02 DIAGNOSIS — M25511 Pain in right shoulder: Secondary | ICD-10-CM | POA: Diagnosis present

## 2015-01-02 NOTE — Therapy (Addendum)
[facility] [address]  [place], [place], [address] Phone: [phone]   Fax:  [phone]  Physical Therapy Treatment  Patient Details  Name: Katrina Ford MRN: [id number] Date of Birth: [birth year] Referring Provider:  Gentry Fitz, MD  Encounter Date: 01/02/2015      PT End of Session - 01/02/15 1615    Visit Number 8   Number of Visits 12   Date for PT Re-Evaluation 01/04/15   PT Start Time 6962   PT Stop Time 1626   PT Time Calculation (min) 56 min   Activity Tolerance Patient tolerated treatment well   Behavior During Therapy [facility] for tasks assessed/performed      Past Medical History  Diagnosis Date  . Gastritis   . Chronic headache   . Hiatal hernia   . GERD (gastroesophageal reflux disease)   . Family history of malignant neoplasm of gastrointestinal tract   . IBS (irritable bowel syndrome)   . Kidney stones   . Depression     Past Surgical History  Procedure Laterality Date  . Tonsillectomy    . Cholecystectomy  2000  . Lithotripsy  [id number]  . Esophageal manometry  11/28/2012    Procedure: ESOPHAGEAL MANOMETRY (EM);  Surgeon: Sable Feil, MD;  Location: [facility];  Service: Endoscopy;  Laterality: N/A;    There were no vitals taken for this visit.  Visit Diagnosis:  Limitation of joint motion of right shoulder  Right shoulder pain  Cervicalgia      Subjective Assessment - 01/02/15 1533    Symptoms Reports having a bad night sleeping due to her shoulder hurting. Woke up this morning with all of her joints hurting for unknow reason. Says pain gets better for a while then gets worse. Is thinking about scheduling an appointment with her MD soon.    Currently in Pain? Yes   Pain Score 5    Pain Location Shoulder  and neck   Pain Orientation Right   Pain Descriptors / Indicators Shooting;Sore   Pain Onset More than a month ago   Aggravating Factors  moving it over  head.    Pain Relieving Factors heat, hot water   Effect of Pain on Daily Activities Can't ready overhead or out to the side.          [facility] PT Assessment - 01/02/15 1547    ROM / Strength   AROM / PROM / Strength AROM   AROM   AROM Assessment Site Shoulder   Right/Left Shoulder Right   Right Shoulder Flexion 105 Degrees   Right Shoulder ABduction 65 Degrees                  [facility] Adult PT Treatment/Exercise - 01/02/15 1537    Shoulder Exercises: Supine   Flexion 5 reps  bilateral pullover with 3# 5 second hold   Other Supine Exercises circles at 90 flexion with 2# x10 cw/ccw   Shoulder Exercises: Seated   Elevation AAROM  green pball x 8 M/R/L   Shoulder Exercises: Standing   Protraction --  wall push ups x8 then push up plus x8   Other Standing Exercises Overhead circles with towl x10 cw/ccw   Other Standing Exercises pendulums x10 cw/ccw   Shoulder Exercises: Pulleys   Flexion 3 minutes   Other Pulley Exercises Scaption x2 minutes (very limited motion)   Shoulder Exercises: ROM/Strengthening   UBE (Upper Arm Bike) no resistance 90 seconds fwd/90  seconds bwd   Other ROM/Strengthening Exercises pball roll up wall x8   Shoulder Exercises: Stretch   Corner Stretch 20 seconds;3 reps  in doorway with hands low   Moist Heat Therapy   Number Minutes Moist Heat 15 Minutes   Moist Heat Location Shoulder  Rt   Electrical Stimulation   Electrical Stimulation Location right lateral cervical area, periscapular and lateral arm   Electrical Stimulation Action IFC   Electrical Stimulation Goals Pain   Manual Therapy   Manual Therapy Massage   Massage to Rt pec and bicep due to tight and tender                  PT Short Term Goals - 12/24/14 1718    PT SHORT TERM GOAL #1   Title Patient independent in initial HEP. (12/14/14)   Time 3   Period Weeks   Status Achieved   PT SHORT TERM GOAL #2   Title AROM right shoulder flexion at least 145 for improved  ADL's (12/14/14)   Time 3   Period Weeks   Status On-going   PT SHORT TERM GOAL #3   Title Report sleep imiprovements by 25% (12/14/14)   Time 3   Period Weeks   Status On-going           PT Long Term Goals - 12/24/14 1719    PT LONG TERM GOAL #1   Title demonstrate technique to reduce risk of re-injury to include info on posture, body mechanics, lifting.  (01/04/15)   Time 6   Period Weeks   Status On-going   PT LONG TERM GOAL #2   Title be independent with advanced HEP (01/04/15)   Time 6   Period Weeks   Status On-going   PT LONG TERM GOAL #3   Title report pain decrease with daily activities to no more than 3-4/10  (01/04/15)   Time 6   Period Weeks   Status On-going   PT LONG TERM GOAL #4   Title increase ROM right shoulder to at least 160 for improved ADL/IADL  (01/04/15)   Time 6   Period Weeks   Status On-going   PT LONG TERM GOAL #5   Title report sleep improved by 50%  (01/04/15)   Time 6   Period Weeks   Status On-going   Additional Long Term Goals   Additional Long Term Goals Yes   PT LONG TERM GOAL #6   Title strength improved right shoulder elevation to at least 4+/5 for improved ADL/IADL's  (01/04/15)   Time 6   Period Weeks   Status On-going               Plan - 01/02/15 1616    Clinical Impression Statement Still limited and painful motion. Continued tenderness along Rt UT, pec, bicep area. Continued with more AAROM today due to difficulty with relaxing during PROM/   Pt will benefit from skilled therapeutic intervention in order to improve on the following deficits Decreased range of motion;Improper body mechanics;Pain;Increased fascial restricitons;Postural dysfunction;Decreased activity tolerance;Decreased mobility;Decreased strength;Impaired UE functional use   Rehab Potential Good   PT Next Visit Plan Renewal   Consulted and Agree with Plan of Care Patient        Problem List Patient Active Problem List   Diagnosis Date Noted  . Dysuria  12/18/2014  . Acute maxillary sinusitis 12/18/2014  . Physical exam 08/14/2014  . Menopausal vaginal dryness 08/14/2014  . Skin lesion of scalp  04/27/2014  . Depression 01/05/2014  . Special screening for malignant neoplasms, colon 10/05/2011  . Abdominal pain, RLQ (right lower quadrant) 10/05/2011  . IBS (irritable bowel syndrome) 10/05/2011  . Abdominal pain 10/01/2011  . GERD (gastroesophageal reflux disease) 10/01/2011  . S/P cholecystectomy 10/01/2011  . Constipation, slow transit 10/01/2011  . Abnormal liver enzymes 10/01/2011  . IRRITABLE BOWEL SYNDROME 07/18/2010  . GERD 12/01/2007  . HIATAL HERNIA 12/01/2007  . HEADACHE, CHRONIC 12/01/2007  . GASTRITIS 07/27/2000    Barbette Hair, PTA 01/02/2015, 4:18 PM  [facility] [address]  [place], [place], [address] Phone: [phone]   Fax:  [phone]   PHYSICAL THERAPY DISCHARGE SUMMARY  Visits from Start of Care:8  Current functional level related to goals / functional outcomes: No LTGs met. Continues with painful shoulder.    Remaining deficits: Shoulder pain, decreased ROM, decrease use of UE   Education / Equipment: HEP Plan: Patient agrees to discharge.  Patient goals were not met. Patient is being discharged due to lack of progress.  ?????    Shan Levans, DPT, CMP

## 2015-08-19 ENCOUNTER — Other Ambulatory Visit: Payer: Self-pay | Admitting: General Practice

## 2015-08-19 ENCOUNTER — Encounter: Payer: Self-pay | Admitting: Family Medicine

## 2015-08-19 ENCOUNTER — Ambulatory Visit (INDEPENDENT_AMBULATORY_CARE_PROVIDER_SITE_OTHER): Payer: Managed Care, Other (non HMO) | Admitting: Family Medicine

## 2015-08-19 VITALS — BP 126/82 | HR 83 | Temp 98.3°F | Resp 16 | Ht 66.0 in | Wt 163.2 lb

## 2015-08-19 DIAGNOSIS — R0683 Snoring: Secondary | ICD-10-CM | POA: Diagnosis not present

## 2015-08-19 DIAGNOSIS — Z1231 Encounter for screening mammogram for malignant neoplasm of breast: Secondary | ICD-10-CM | POA: Diagnosis not present

## 2015-08-19 DIAGNOSIS — Z0000 Encounter for general adult medical examination without abnormal findings: Secondary | ICD-10-CM

## 2015-08-19 DIAGNOSIS — M2550 Pain in unspecified joint: Secondary | ICD-10-CM | POA: Insufficient documentation

## 2015-08-19 DIAGNOSIS — G4733 Obstructive sleep apnea (adult) (pediatric): Secondary | ICD-10-CM | POA: Insufficient documentation

## 2015-08-19 LAB — CBC WITH DIFFERENTIAL/PLATELET
Basophils Absolute: 0 10*3/uL (ref 0.0–0.1)
Basophils Relative: 0.5 % (ref 0.0–3.0)
Eosinophils Absolute: 0.1 10*3/uL (ref 0.0–0.7)
Eosinophils Relative: 1.3 % (ref 0.0–5.0)
HCT: 44.9 % (ref 36.0–46.0)
Hemoglobin: 15.1 g/dL — ABNORMAL HIGH (ref 12.0–15.0)
LYMPHS ABS: 1.8 10*3/uL (ref 0.7–4.0)
Lymphocytes Relative: 26.6 % (ref 12.0–46.0)
MCHC: 33.5 g/dL (ref 30.0–36.0)
MCV: 90.6 fl (ref 78.0–100.0)
MONO ABS: 0.4 10*3/uL (ref 0.1–1.0)
Monocytes Relative: 5.4 % (ref 3.0–12.0)
NEUTROS PCT: 66.2 % (ref 43.0–77.0)
Neutro Abs: 4.5 10*3/uL (ref 1.4–7.7)
Platelets: 312 10*3/uL (ref 150.0–400.0)
RBC: 4.95 Mil/uL (ref 3.87–5.11)
RDW: 13.1 % (ref 11.5–15.5)
WBC: 6.8 10*3/uL (ref 4.0–10.5)

## 2015-08-19 LAB — HEPATIC FUNCTION PANEL
ALBUMIN: 4.5 g/dL (ref 3.5–5.2)
ALK PHOS: 65 U/L (ref 39–117)
ALT: 13 U/L (ref 0–35)
AST: 16 U/L (ref 0–37)
Bilirubin, Direct: 0 mg/dL (ref 0.0–0.3)
TOTAL PROTEIN: 7.4 g/dL (ref 6.0–8.3)
Total Bilirubin: 0.4 mg/dL (ref 0.2–1.2)

## 2015-08-19 LAB — LIPID PANEL
CHOLESTEROL: 166 mg/dL (ref 0–200)
HDL: 40 mg/dL (ref >39.00)
LDL Cholesterol: 109 mg/dL — ABNORMAL HIGH (ref 0–99)
NonHDL: 126.42
Total CHOL/HDL Ratio: 4
Triglycerides: 88 mg/dL (ref 0.0–149.0)
VLDL: 17.6 mg/dL (ref 0.0–40.0)

## 2015-08-19 LAB — BASIC METABOLIC PANEL
BUN: 14 mg/dL (ref 6–23)
CALCIUM: 9.8 mg/dL (ref 8.4–10.5)
CHLORIDE: 105 meq/L (ref 96–112)
CO2: 29 meq/L (ref 19–32)
CREATININE: 0.72 mg/dL (ref 0.40–1.20)
GFR: 89.58 mL/min (ref >60.00)
Glucose, Bld: 109 mg/dL — ABNORMAL HIGH (ref 70–99)
Potassium: 4.2 mEq/L (ref 3.5–5.1)
SODIUM: 142 meq/L (ref 135–145)

## 2015-08-19 LAB — VITAMIN D 25 HYDROXY (VIT D DEFICIENCY, FRACTURES): VITD: 21.05 ng/mL — AB (ref 30.00–100.00)

## 2015-08-19 LAB — TSH: TSH: 1.75 u[IU]/mL (ref 0.35–4.50)

## 2015-08-19 MED ORDER — VITAMIN D (ERGOCALCIFEROL) 1.25 MG (50000 UNIT) PO CAPS: 50000.0000 [IU] | ORAL_CAPSULE | ORAL | Status: DC

## 2015-08-19 NOTE — Assessment & Plan Note (Signed)
New.  Pt's husband reports loud snoring and breathing pauses.  Based on this, will refer to pulmonary for complete evaluation and tx.

## 2015-08-19 NOTE — Assessment & Plan Note (Signed)
Pt's PE WNL w/ exception of limited mobility of R shoulder.  UTD on colonoscopy, pap.  Due for mammo- order entered.  Check labs.  Anticipatory guidance provided.

## 2015-08-19 NOTE — Progress Notes (Signed)
   Subjective:    Patient ID: Katrina Ford, female    DOB: 12-10-1960, 54 y.o.   MRN: [id number]  HPI Physical exam- UTD on colonoscopy (2022), pap ([place]-due 2017), due for mammo but difficulty w/ R frozen shoulder ([place])   Review of Systems Patient reports no vision/ hearing changes, adenopathy,fever, weight change,  persistant/recurrent hoarseness , swallowing issues, chest pain, palpitations, edema, persistant/recurrent cough, hemoptysis, dyspnea (rest/exertional/paroxysmal nocturnal), gastrointestinal bleeding (melena, rectal bleeding), abdominal pain, significant heartburn, bowel changes, GU symptoms (dysuria, hematuria, incontinence), Gyn symptoms (abnormal  bleeding, pain),  syncope, memory loss, numbness & tingling, skin/hair/nail changes, abnormal bruising or bleeding, anxiety, or depression.   + family hx of Lupus- pt interested in seeing Rheum due to multiple joint pains + snoring and husband reports breathing pauses    Objective:   Physical Exam General Appearance:    Alert, cooperative, no distress, appears stated age  Head:    Normocephalic, without obvious abnormality, atraumatic  Eyes:    PERRL, conjunctiva/corneas clear, EOM's intact, fundi    benign, both eyes  Ears:    Normal TM's and external ear canals, both ears  Nose:   Nares normal, septum midline, mucosa normal, no drainage    or sinus tenderness  Throat:   Lips, mucosa, and tongue normal; teeth and gums normal  Neck:   Supple, symmetrical, trachea midline, no adenopathy;    Thyroid: no enlargement/tenderness/nodules  Back:     Symmetric, no curvature, ROM normal, no CVA tenderness  Lungs:     Clear to auscultation bilaterally, respirations unlabored  Chest Wall:    No tenderness or deformity   Heart:    Regular rate and rhythm, S1 and S2 normal, no murmur, rub   or gallop  Breast Exam:    Deferred to GYN  Abdomen:     Soft, non-tender, bowel sounds active all four quadrants,    no masses, no organomegaly    Genitalia:    Deferred to GYN  Rectal:    Extremities:   Extremities normal, atraumatic, no cyanosis or edema  Pulses:   2+ and symmetric all extremities  Skin:   Skin color, texture, turgor normal, no rashes or lesions  Lymph nodes:   Cervical, supraclavicular, and axillary nodes normal  Neurologic:   CNII-XII intact, normal strength, sensation and reflexes    throughout          Assessment & Plan:

## 2015-08-19 NOTE — Progress Notes (Signed)
Pre visit review using our clinic review tool, if applicable. No additional management support is needed unless otherwise documented below in the visit note. 

## 2015-08-19 NOTE — Patient Instructions (Signed)
Follow up in 1 year or as needed We'll notify you of your lab results and make any changes if needed We'll call you with your mammogram appt We'll call you with your Rheumatology and pulmonary appts Keep up the good work on healthy diet and regular exercise- you look great! Call with any questions or concerns If you want to join [place] at the new [place] office, any scheduled appointments will automatically transfer and we will see you at [address] [place] [address] [place],  [id number] Happy Fall!!!

## 2015-08-19 NOTE — Assessment & Plan Note (Signed)
New.  Pt has + family hx of lupus and now experiencing multiple joint pains.  Will refer to rheum for complete evaluation and tx.  Pt expressed understanding and is in agreement w/ plan.

## 2015-09-25 ENCOUNTER — Telehealth: Payer: Self-pay | Admitting: Family Medicine

## 2015-09-25 LAB — HM MAMMOGRAPHY

## 2015-09-25 NOTE — Telephone Encounter (Signed)
Noted will advise PCP.

## 2015-09-25 NOTE — Telephone Encounter (Signed)
Caller name: Janett Billow  Relationship to patient: [facility]   Can be reached: [phone]  Reason for call: States they sent letter on Monday to be signed by Dr. Birdie Riddle in ref to patients abnormal results. They are re-faxing it this morning and needs it back today for patients appointment

## 2015-09-25 NOTE — Telephone Encounter (Signed)
I have signed everything that has come in and returned it to [employer]

## 2015-09-25 NOTE — Telephone Encounter (Signed)
Can you be on the lookout for this?

## 2015-09-25 NOTE — Telephone Encounter (Signed)
Form faxed yesterday successfully, received confirmation. Will refax now. JG//CMA

## 2015-10-02 ENCOUNTER — Encounter: Payer: Self-pay | Admitting: General Practice

## 2015-10-03 ENCOUNTER — Encounter: Payer: Self-pay | Admitting: Pulmonary Disease

## 2015-10-03 ENCOUNTER — Ambulatory Visit (INDEPENDENT_AMBULATORY_CARE_PROVIDER_SITE_OTHER): Payer: Managed Care, Other (non HMO) | Admitting: Pulmonary Disease

## 2015-10-03 ENCOUNTER — Other Ambulatory Visit: Payer: Self-pay | Admitting: Pulmonary Disease

## 2015-10-03 VITALS — BP 124/75 | HR 84 | Temp 97.6°F | Ht 65.75 in | Wt 167.0 lb

## 2015-10-03 DIAGNOSIS — R32 Unspecified urinary incontinence: Secondary | ICD-10-CM | POA: Diagnosis not present

## 2015-10-03 DIAGNOSIS — R0683 Snoring: Secondary | ICD-10-CM | POA: Diagnosis not present

## 2015-10-03 NOTE — Progress Notes (Signed)
Subjective:    Patient ID: Katrina Ford, female    DOB: Oct 16, 1961, 54 y.o.   MRN: [id number]  HPI  Chief Complaint  Patient presents with  . Sleep consult    Referred by Dr. Birdie Riddle; Snoring, Witnessed apneas, trouble staying asleep, gets up to use bathroom a lot at night.  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 Her husband has OSA and he has noted moderate snoring for about one year and witnessed apneas. She does report some degree of difficulty initiating and maintaining her sleep and frequent nocturnal awakenings for many years. Epworth sleepiness score is 3-but I suspect that she is underreporting. Bedtime is around 10 PM, the TV stays on for about her husband, she reports 3-4 nocturnal awakenings, tosses and turns through the night, reports at least 2 bathroom visits and is out of bed by 6 AM feeling tired without dryness of mouth. She sleeps on her left side with one pillow due to a right frozen shoulder. She reports occasional migraines-but these are not in the morning. She has gained about 30 pounds over the last 2 years for unclear reasons-TSH was normal. She denies excessive use of caffeinated beverages due to kidney stones. There is no history suggestive of cataplexy, sleep paralysis or parasomnias  She has used a mouth guard in the past for bruxism  She reports 2 episodes of urinary incontinence during sleep in the past one week, reports some degree of urinary irritation and frequency  Past Medical History  Diagnosis Date  . Gastritis   . Chronic headache   . Hiatal hernia   . GERD (gastroesophageal reflux disease)   . Family history of malignant neoplasm of gastrointestinal tract   . IBS (irritable bowel syndrome)   . Kidney stones   . Depression   . Frozen shoulder syndrome     Past Surgical History  Procedure Laterality Date  . Tonsillectomy    . Cholecystectomy  02-26-1999  . Lithotripsy   [id number]  . Esophageal manometry  11/28/2012    Procedure: ESOPHAGEAL MANOMETRY (EM);  Surgeon: Sable Feil, MD;  Location: [facility];  Service: Endoscopy;  Laterality: N/A;    No Known Allergies  Social History   Social History  . Marital Status: Married    Spouse Name: N/A  . Number of Children: 2  . Years of Education: N/A   Occupational History  . Not on file.   Social History Main Topics  . Smoking status: Never Smoker   . Smokeless tobacco: Never Used  . Alcohol Use: No  . Drug Use: No  . Sexual Activity: Yes    Birth Control/ Protection: None   Other Topics Concern  . Not on file   Social History Narrative    Family History  Problem Relation Age of Onset  . Colon cancer Maternal Uncle 81    Passed away in 25-Feb-2009  . Diabetes Mother   . Heart disease Mother   . Diverticulitis Mother   . Hyperlipidemia Mother   . Heart disease Paternal Grandmother     Siblings also  . Hyperlipidemia Father   . Cancer Father     skin cancer  . Hyperlipidemia Brother   . Hyperlipidemia Sister   . Fibromyalgia Cousin   . Fibromyalgia Cousin   . Fibromyalgia Cousin   . Lupus Cousin   . Lupus Cousin      Review of Systems  Constitutional: Negative for fever, chills and  unexpected weight change.  HENT: Negative for congestion, dental problem, ear pain, nosebleeds, postnasal drip, rhinorrhea, sinus pressure, sneezing, sore throat, trouble swallowing and voice change.   Eyes: Negative for visual disturbance.  Respiratory: Negative for cough, choking and shortness of breath.   Cardiovascular: Negative for chest pain and leg swelling.  Gastrointestinal: Negative for vomiting, abdominal pain and diarrhea.  Genitourinary: Negative for difficulty urinating.  Musculoskeletal: Negative for arthralgias.  Skin: Negative for rash.  Neurological: Negative for tremors, syncope and headaches.  Hematological: Does not bruise/bleed easily.       Objective:   Physical  Exam  Gen. Pleasant, well-nourished, in no distress, normal affect ENT - no lesions, no post nasal drip Neck: No JVD, no thyromegaly, no carotid bruits Lungs: no use of accessory muscles, no dullness to percussion, clear without rales or rhonchi  Cardiovascular: Rhythm regular, heart sounds  normal, no murmurs or gallops, no peripheral edema Abdomen: soft and non-tender, no hepatosplenomegaly, BS normal. Musculoskeletal: No deformities, no cyanosis or clubbing Neuro:  alert, non focal       Assessment & Plan:

## 2015-10-03 NOTE — Patient Instructions (Signed)
Home sleep study  Check UA/ culture If OK, check with your doctor for incontinence  Can consider trial of melatonin for insomnia - to improve sleep maintenance  Weight loss would help

## 2015-10-04 ENCOUNTER — Telehealth: Payer: Self-pay | Admitting: Pulmonary Disease

## 2015-10-04 LAB — URINALYSIS
BILIRUBIN URINE: NEGATIVE
GLUCOSE, UA: NEGATIVE
Hgb urine dipstick: NEGATIVE
Ketones, ur: NEGATIVE
Leukocytes, UA: NEGATIVE
Nitrite: NEGATIVE
PROTEIN: NEGATIVE
Specific Gravity, Urine: 1.006 (ref 1.001–1.035)
pH: 7 (ref 5.0–8.0)

## 2015-10-04 NOTE — Telephone Encounter (Signed)
Per U/A results:  Result Note     No e/o infection  ---  I spoke with patient about results and she verbalized understanding and had no questions.

## 2015-10-04 NOTE — Assessment & Plan Note (Addendum)
Given excessive daytime somnolence, narrow pharyngeal exam, witnessed apneas & loud snoring, obstructive sleep apnea is very likely & an overnight polysomnogram will be scheduled as a home study. The pathophysiology of obstructive sleep apnea , it's cardiovascular consequences & modes of treatment including CPAP were discused with the patient in detail & they evidenced understanding. Pretest probability is intermediate  If sleep disorder breathing is mild or absent, can consider using melatonin to consolidate sleep and to help with sleep onset insomnia

## 2015-10-04 NOTE — Telephone Encounter (Signed)
Error.[name] ° °

## 2015-10-04 NOTE — Assessment & Plan Note (Signed)
She denies a clear history suggestive of stress or overflow incontinence. Due to other urinary symptoms of irritation and frequency, negative rule out UTI first. OSA generally does not cause incontinence although it can lead to nocturia.

## 2015-10-05 LAB — URINE CULTURE
Colony Count: NO GROWTH
ORGANISM ID, BACTERIA: NO GROWTH

## 2015-10-15 ENCOUNTER — Encounter: Payer: Self-pay | Admitting: Family Medicine

## 2015-10-22 ENCOUNTER — Telehealth: Payer: Self-pay | Admitting: Pulmonary Disease

## 2015-10-22 DIAGNOSIS — R0683 Snoring: Secondary | ICD-10-CM

## 2015-10-22 NOTE — Telephone Encounter (Signed)
Gave order to [facility] to get precerted. Called the pt & made her aware we have the order now.  We will get it precerted & will give her a call in the next week or 2 to schedule her a time to come by & pick up the machine. Nothing further needed.

## 2015-10-22 NOTE — Telephone Encounter (Signed)
Per 10/03/15 pt was suppose to have HST ordered and do not see this was done. I have now placed order. Called spoke with pt. Advised will check with our pcc's to see how long the wait list is for her to have this done. Please advise PCC's thanks

## 2015-10-29 DIAGNOSIS — G4733 Obstructive sleep apnea (adult) (pediatric): Secondary | ICD-10-CM | POA: Diagnosis not present

## 2015-11-01 ENCOUNTER — Telehealth: Payer: Self-pay | Admitting: Pulmonary Disease

## 2015-11-01 NOTE — Telephone Encounter (Signed)
Called spoke with pt. She reports she had HST done earlier this week and is requesting results. Aware RA currently out of office. Please advise thanks

## 2015-11-02 DIAGNOSIS — G4733 Obstructive sleep apnea (adult) (pediatric): Secondary | ICD-10-CM | POA: Diagnosis not present

## 2015-11-05 ENCOUNTER — Other Ambulatory Visit: Payer: Self-pay | Admitting: *Deleted

## 2015-11-05 DIAGNOSIS — R0683 Snoring: Secondary | ICD-10-CM

## 2015-11-05 NOTE — Telephone Encounter (Signed)
Dr. Alva, please advise. 

## 2015-11-11 NOTE — Telephone Encounter (Signed)
Left message for patient to call back  

## 2015-11-11 NOTE — Telephone Encounter (Signed)
She has mild to moderate OSA on home sleep test-AHI 15/hour  treatment options- CPAP therapy versus oral appliance Can proceed with CPAP therapy Schedule office visit to discuss if she has questions

## 2015-11-11 NOTE — Telephone Encounter (Signed)
RA - please advise. Thanks! 

## 2015-11-12 NOTE — Telephone Encounter (Signed)
Spoke with pt, aware of results.  Pt states she would like an ov to discuss cpap therapy in further detail before ordering cpap.   Pt scheduled with RA tomorrow at 3:30-aware of appt being in [place] office.  Nothing further needed.

## 2015-11-12 NOTE — Telephone Encounter (Signed)
[phone], pt cb

## 2015-11-12 NOTE — Telephone Encounter (Signed)
Left message for patient to call back  

## 2015-11-13 ENCOUNTER — Ambulatory Visit (INDEPENDENT_AMBULATORY_CARE_PROVIDER_SITE_OTHER): Payer: Managed Care, Other (non HMO) | Admitting: Pulmonary Disease

## 2015-11-13 ENCOUNTER — Encounter: Payer: Self-pay | Admitting: Pulmonary Disease

## 2015-11-13 VITALS — BP 130/70 | HR 96 | Ht 66.0 in | Wt 172.2 lb

## 2015-11-13 DIAGNOSIS — G4733 Obstructive sleep apnea (adult) (pediatric): Secondary | ICD-10-CM

## 2015-11-13 NOTE — Progress Notes (Signed)
   Subjective:    Patient ID: Katrina Ford, female    DOB: September 14, 1961, 55 y.o.   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 Being tested for Sjogrens by rheum  HST AHI 15/h, lowest satn 78% Reviewed sleep study in detail -her main complaint is excessive daytime tiredness   Review of Systems neg for any significant sore throat, dysphagia, itching, sneezing, nasal congestion or excess/ purulent secretions, fever, chills, sweats, unintended wt loss, pleuritic or exertional cp, hempoptysis, orthopnea pnd or change in chronic leg swelling. Also denies presyncope, palpitations, heartburn, abdominal pain, nausea, vomiting, diarrhea or change in bowel or urinary habits, dysuria,hematuria, rash, arthralgias, visual complaints, headache, numbness weakness or ataxia.     Objective:   Physical Exam  Gen. Pleasant, well-nourished, in no distress ENT - no lesions, no post nasal drip Neck: No JVD, no thyromegaly, no carotid bruits Lungs: no use of accessory muscles, no dullness to percussion, clear without rales or rhonchi  Cardiovascular: Rhythm regular, heart sounds  normal, no murmurs or gallops, no peripheral edema Musculoskeletal: No deformities, no cyanosis or clubbing        Assessment & Plan:

## 2015-11-13 NOTE — Patient Instructions (Signed)
Start CPAP trial - we discussed options

## 2015-11-13 NOTE — Assessment & Plan Note (Addendum)
we discussed treatment options Trial of autoCPAP 5- 12 cm -nasal pillows , download in 4 wks At the end of this treatment trial, we will look for improvement in her daytime energy and tiredness & ability to tolerate CPAP  Weight loss encouraged, compliance with goal of at least 4-6 hrs every night is the expectation. Advised against medications with sedative side effects Cautioned against driving when sleepy - understanding that sleepiness will vary on a day to day basis

## 2016-01-02 ENCOUNTER — Telehealth: Payer: Self-pay | Admitting: Pulmonary Disease

## 2016-01-02 NOTE — Telephone Encounter (Signed)
Per Dr. Elsworth Soho:  CPAP compliance report shows no leakage, Average pressure 10cm - settings look ok.  Needs to increase usage to a goal of 6 hours per night.  --------------------------------  Left message for patient to call back.

## 2016-01-03 NOTE — Telephone Encounter (Signed)
[phone], pt cb

## 2016-01-03 NOTE — Telephone Encounter (Signed)
Pt is aware of results. Nothing further was needed. 

## 2016-01-08 ENCOUNTER — Encounter: Payer: Self-pay | Admitting: Pulmonary Disease

## 2016-01-09 ENCOUNTER — Ambulatory Visit (INDEPENDENT_AMBULATORY_CARE_PROVIDER_SITE_OTHER): Payer: Managed Care, Other (non HMO) | Admitting: Pulmonary Disease

## 2016-01-09 ENCOUNTER — Encounter: Payer: Self-pay | Admitting: Pulmonary Disease

## 2016-01-09 VITALS — BP 130/74 | HR 78 | Ht 67.0 in | Wt 157.0 lb

## 2016-01-09 DIAGNOSIS — G4733 Obstructive sleep apnea (adult) (pediatric): Secondary | ICD-10-CM | POA: Diagnosis not present

## 2016-01-09 NOTE — Patient Instructions (Signed)
You are on autoCPAP settings - avg pr 10 cm Try to use at least 6h every night

## 2016-01-09 NOTE — Assessment & Plan Note (Signed)
You are on autoCPAP settings - avg pr 10 cm Try to use at least 6h every night She is feeling/resting better -hence ct with CPAP use For insomnia, try melatonin 2h prior to bedtime  Weight loss encouraged, compliance with goal of at least 4-6 hrs every night is the expectation. Advised against medications with sedative side effects Cautioned against driving when sleepy - understanding that sleepiness will vary on a day to day basis

## 2016-01-09 NOTE — Progress Notes (Signed)
   Subjective:    Patient ID: Katrina Ford, female    DOB: [birth year], 55 y.o.   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 Diagnosed with Sjogrens by rheum & fibromyalgia   01/09/2016  Chief Complaint  Patient presents with  . Follow-up    patient does not like wearing CPAP; nasal pillows rubbing nose raw; not sleeping well at night, waking up around 2am-3am and cannot get back to sleep, thinks it is due to Sjorgens Syndrome.   Trying melatonin at bedtime Finally adjusted to nasal pillows Pr ok, no dryness No DME issues  Started on plaquanil  Download 01/2016 >> o auto 5-12 cm, avg 9 cm, no residuals, no leak, usage avg 5h  Significant tests/ events  HST AHI 15/h, lowest satn 78%  Review of Systems Patient denies significant dyspnea,cough, hemoptysis,  chest pain, palpitations, pedal edema, orthopnea, paroxysmal nocturnal dyspnea, lightheadedness, nausea, vomiting, abdominal or  leg pains      Objective:   Physical Exam  Gen. Pleasant, well-nourished, in no distress ENT - no lesions, no post nasal drip Neck: No JVD, no thyromegaly, no carotid bruits Lungs: no use of accessory muscles, no dullness to percussion, clear without rales or rhonchi  Cardiovascular: Rhythm regular, heart sounds  normal, no murmurs or gallops, no peripheral edema Musculoskeletal: No deformities, no cyanosis or clubbing         Assessment & Plan:

## 2016-01-22 ENCOUNTER — Encounter: Payer: Self-pay | Admitting: Pulmonary Disease

## 2016-04-09 ENCOUNTER — Ambulatory Visit: Payer: Managed Care, Other (non HMO) | Admitting: Adult Health

## 2016-04-10 ENCOUNTER — Ambulatory Visit: Payer: Managed Care, Other (non HMO) | Admitting: Adult Health

## 2016-04-17 ENCOUNTER — Ambulatory Visit: Payer: Managed Care, Other (non HMO) | Admitting: Adult Health

## 2016-05-15 ENCOUNTER — Encounter: Payer: Self-pay | Admitting: Family Medicine

## 2016-05-15 ENCOUNTER — Ambulatory Visit (INDEPENDENT_AMBULATORY_CARE_PROVIDER_SITE_OTHER): Payer: 59 | Admitting: Adult Health

## 2016-05-15 ENCOUNTER — Encounter: Payer: Self-pay | Admitting: Adult Health

## 2016-05-15 VITALS — BP 126/68 | HR 79 | Temp 98.0°F | Ht 65.0 in | Wt 163.0 lb

## 2016-05-15 DIAGNOSIS — G4733 Obstructive sleep apnea (adult) (pediatric): Secondary | ICD-10-CM | POA: Diagnosis not present

## 2016-05-15 NOTE — Patient Instructions (Signed)
Order for nasal CPAP mask .  Saline nasal spray As needed   Wear CPAP At bedtime   Goal is at least 4hr each night  Do not drive if sleepy.  follow up Dr. Elsworth Soho  In 6 months and As needed

## 2016-05-15 NOTE — Addendum Note (Signed)
Addended by: Osa Craver on: 05/15/2016 02:32 PM   Modules accepted: Orders

## 2016-05-15 NOTE — Progress Notes (Signed)
Subjective:    Patient ID: Katrina Ford, female    DOB: 1961/08/27, 55 y.o.   MRN: [id number]  HPI 55 year old female with a structure sleep apnea Diagnosed with Sjogrens by rheum & fibromyalgia  TEST  HST AHI 15/h, lowest satn 78%  05/15/2016 follow-up sleep apnea  Patient returns or a four-month follow-up for sleep apnea. Unfortunately, patient has not been wearing her C Pap machine. Says that she's had a recent sinus infection.. Was treated with antibiotics. Starting to feel better.  Feels the nasal pillows are not working now. Wants to try new mask We discussed nasal mask.  Denies chest pain, orthopnea, or edema.      Past Medical History  Diagnosis Date  . Gastritis   . Chronic headache   . Hiatal hernia   . GERD (gastroesophageal reflux disease)   . Family history of malignant neoplasm of gastrointestinal tract   . IBS (irritable bowel syndrome)   . Kidney stones   . Depression   . Frozen shoulder syndrome    Current Outpatient Prescriptions on File Prior to Visit  Medication Sig Dispense Refill  . hydroxychloroquine (PLAQUENIL) 200 MG tablet     . meloxicam (MOBIC) 15 MG tablet Take 15 mg by mouth daily.    . TURMERIC CURCUMIN PO Take by mouth.    . vitamin B-12 (CYANOCOBALAMIN) 100 MCG tablet Take 100 mcg by mouth daily.    . Vitamin D, Ergocalciferol, (DRISDOL) 50000 UNITS CAPS capsule Take 1 capsule (50,000 Units total) by mouth every 7 (seven) days. (Patient taking differently: Take 3,000 Units by mouth every 7 (seven) days. ) 4 capsule 2  . pyridOXINE (VITAMIN B-6) 100 MG tablet Take 100 mg by mouth daily. Reported on 05/15/2016     No current facility-administered medications on file prior to visit.     Review of Systems Constitutional:   No  weight loss, night sweats,  Fevers, chills, fatigue, or  lassitude.  HEENT:   No headaches,  Difficulty swallowing,  Tooth/dental problems, or  Sore throat,                No sneezing, itching, ear ache, nasal  congestion, post nasal drip,   CV:  No chest pain,  Orthopnea, PND, swelling in lower extremities, anasarca, dizziness, palpitations, syncope.   GI  No heartburn, indigestion, abdominal pain, nausea, vomiting, diarrhea, change in bowel habits, loss of appetite, bloody stools.   Resp: No shortness of breath with exertion or at rest.  No excess mucus, no productive cough,  No non-productive cough,  No coughing up of blood.  No change in color of mucus.  No wheezing.  No chest wall deformity  Skin: no rash or lesions.  GU: no dysuria, change in color of urine, no urgency or frequency.  No flank pain, no hematuria   MS:  No joint pain or swelling.  No decreased range of motion.  No back pain.  Psych:  No change in mood or affect. No depression or anxiety.  No memory loss.          Objective:   Physical Exam Filed Vitals:   05/15/16 1409  BP: 126/68  Pulse: 79  Temp: 98 F (36.7 C)  TempSrc: Oral  Height: 5\' 5"  (1.651 m)  Weight: 163 lb (73.936 kg)  SpO2: 97%  Body mass index is 27.12 kg/(m^2).  GEN: A/Ox3; pleasant , NAD, well nourished   HEENT:  [place]/AT,  EACs-clear, TMs-wnl, NOSE-clear, THROAT-clear, no lesions, no postnasal drip  or exudate noted. Class 2 MP airway   NECK:  Supple w/ fair ROM; no JVD; normal carotid impulses w/o bruits; no thyromegaly or nodules palpated; no lymphadenopathy.  RESP  Clear  P & A; w/o, wheezes/ rales/ or rhonchi.no accessory muscle use, no dullness to percussion  CARD:  RRR, no m/r/g  , no peripheral edema, pulses intact, no cyanosis or clubbing.  GI:   Soft & nt; nml bowel sounds; no organomegaly or masses detected.  Musco: Warm bil, no deformities or joint swelling noted.   Neuro: alert, no focal deficits noted.    Skin: Warm, no lesions or rashes  Tammy Parrett NP-C  [facility]  05/15/2016        Assessment & Plan:

## 2016-05-15 NOTE — Assessment & Plan Note (Signed)
Encouraged on CPAP compliance   Plan  Order for nasal CPAP mask .  Saline nasal spray As needed   Wear CPAP At bedtime   Goal is at least 4hr each night  Do not drive if sleepy.  follow up Dr. Elsworth Soho  In 6 months and As needed

## 2016-07-03 ENCOUNTER — Encounter: Payer: Self-pay | Admitting: Internal Medicine

## 2016-07-03 ENCOUNTER — Ambulatory Visit (INDEPENDENT_AMBULATORY_CARE_PROVIDER_SITE_OTHER): Payer: 59 | Admitting: Internal Medicine

## 2016-07-03 VITALS — BP 142/76 | HR 80 | Ht 65.0 in | Wt 164.0 lb

## 2016-07-03 DIAGNOSIS — K580 Irritable bowel syndrome with diarrhea: Secondary | ICD-10-CM

## 2016-07-03 DIAGNOSIS — K219 Gastro-esophageal reflux disease without esophagitis: Secondary | ICD-10-CM | POA: Diagnosis not present

## 2016-07-03 DIAGNOSIS — R1013 Epigastric pain: Secondary | ICD-10-CM

## 2016-07-03 MED ORDER — PANTOPRAZOLE SODIUM 40 MG PO TBEC: 40.0000 mg | DELAYED_RELEASE_TABLET | Freq: Every day | ORAL | 1 refills | Status: DC

## 2016-07-03 MED ORDER — RIFAXIMIN 550 MG PO TABS: 550.0000 mg | ORAL_TABLET | Freq: Three times a day (TID) | ORAL | 0 refills | Status: AC

## 2016-07-03 NOTE — Patient Instructions (Addendum)
Your physician has requested that you go to the basement for the following lab work before leaving today: Gi pathogen panel, calprotectin, fecal elastase (please do NOT take the Xifaxan until you have heard about these results from our office)  We have sent your demographic information and a prescription for Xifaxan to [facility]. This pharmacy is able to get medication approved through insurance and get you the lowest copay possible. If you have not heard from them within 1 week, please call our office at [phone] to let us know.  We have sent the following medications to your pharmacy for you to pick up at your convenience: Protonix 40 mg daily  Please follow up with Dr Hilarie Fredrickson on Tuesday, 10/06/16 @ 1:45 pm  If you are age 70 or older, your body mass index should be between 23-30. Your Body mass index is 27.29 kg/m. If this is out of the aforementioned range listed, please consider follow up with your Primary Care Provider.  If you are age 48 or younger, your body mass index should be between 19-25. Your Body mass index is 27.29 kg/m. If this is out of the aformentioned range listed, please consider follow up with your Primary Care Provider.

## 2016-07-03 NOTE — Progress Notes (Signed)
HPI: Katrina Ford is a 55 year old female with a past medical history of GERD, hiatal hernia, IBS who is seen in follow-up. She saw Dr. Sharlett Iles for many years and then Dr. Deatra Ina once, last in 2015. She also has a history of Sjogren's disease, kidney stones, headaches and depression.  She reports that she has been having epigastric pressure and nausea over the last few months. This is not associated with vomiting. Her appetite has been good. The pain doesn't necessarily relate to eating but heavy meals seem to make it worse. She does have heartburn with esophageal burning and regurgitation. She denies dysphagia and odynophagia. On reviewing records she previously was on PPI but this medication became expensive, not covered by insurance and was eventually dropped. She is not recall an allergy or intolerance to this medicine.  She reports more diarrhea over the last several months to a year. She rarely has constipation. She reports diarrhea from the second she wakes up until about lunchtime. This can last for several months that time. She will have brief periods of normal bowel movements and then feels slightly constipated, followed by returning to prolonged diarrhea. She estimates 4-6 bowel movements each morning which are loose and watery. She tried stopping fish oil, tumeric, and vitamin B but no change in diarrhea. She describes rectal "cramps" which wake her up on occasion. These come in spells and can be gone for many months. She denies rectal bleeding or melena. She's had these off and on for years and she reports Dr. Sharlett Iles used to treat these with a "muscle relaxer". She at times takes methocarbamol with relief of this cramping discomfort.  Previously her workup has included esophageal manometry, performed on 11/28/2012 which was normal  Colonoscopy 10/05/2011 -- tortuous and redundant colon, no polyps or cancers. Retroflexed view normal. Prep excellent to the cecum.  Upper endoscopy  08/18/2010 normal. Small bowel biopsies normal. Esophagus biopsies normal.  Past Medical History:  Diagnosis Date  . Chronic headache   . Depression   . Family history of malignant neoplasm of gastrointestinal tract   . Frozen shoulder syndrome   . Gastritis   . GERD (gastroesophageal reflux disease)   . Hiatal hernia   . IBS (irritable bowel syndrome)   . Kidney stones   . Sjogren's disease [facility])     Past Surgical History:  Procedure Laterality Date  . CHOLECYSTECTOMY  2000  . ESOPHAGEAL MANOMETRY  11/28/2012   Procedure: ESOPHAGEAL MANOMETRY (EM);  Surgeon: Sable Feil, MD;  Location: [facility];  Service: Endoscopy;  Laterality: N/A;  . FOOT SURGERY Bilateral 03/2013  . LITHOTRIPSY  [id number]  . TONSILLECTOMY      Outpatient Medications Prior to Visit  Medication Sig Dispense Refill  . Biotin 10 MG CAPS Take 1 tablet by mouth daily.    . hydroxychloroquine (PLAQUENIL) 200 MG tablet     . meloxicam (MOBIC) 15 MG tablet Take 15 mg by mouth daily.    Marland Kitchen pyridOXINE (VITAMIN B-6) 100 MG tablet Take 100 mg by mouth daily. Reported on 05/15/2016    . TURMERIC CURCUMIN PO Take by mouth.    . vitamin B-12 (CYANOCOBALAMIN) 100 MCG tablet Take 100 mcg by mouth daily.    . Vitamin D, Ergocalciferol, (DRISDOL) 50000 UNITS CAPS capsule Take 1 capsule (50,000 Units total) by mouth every 7 (seven) days. (Patient taking differently: Take 5,000 Units by mouth every 7 (seven) days. ) 4 capsule 2  . Omega-3 Fatty Acids (FISH OIL) 1200 MG CPDR Take  1 tablet by mouth daily.     No facility-administered medications prior to visit.     No Known Allergies  Family History  Problem Relation Age of Onset  . Diabetes Mother   . Heart disease Mother   . Diverticulitis Mother   . Hyperlipidemia Mother   . Hyperlipidemia Father   . Cancer Father     skin cancer  . Fibromyalgia Cousin   . Fibromyalgia Cousin   . Fibromyalgia Cousin   . Lupus Cousin   . Lupus Cousin   . Colon cancer  Maternal Uncle 22    Passed away in 02/25/09  . Heart disease Paternal Grandmother     Siblings also  . Hyperlipidemia Brother   . Hyperlipidemia Sister   . Breast cancer Paternal Aunt     Social History  Substance Use Topics  . Smoking status: Never Smoker  . Smokeless tobacco: Never Used  . Alcohol use No    ROS: As per history of present illness, otherwise negative  BP (!) 142/76   Pulse 80   Ht 5\' 5"  (1.651 m)   Wt 164 lb (74.4 kg)   BMI 27.29 kg/m  Constitutional: Well-developed and well-nourished. No distress. HEENT: Normocephalic and atraumatic. Oropharynx is clear and moist. No oropharyngeal exudate. Conjunctivae are normal.  No scleral icterus. Neck: Neck supple. Trachea midline. Cardiovascular: Normal rate, regular rhythm and intact distal pulses. No M/R/G Pulmonary/chest: Effort normal and breath sounds normal. No wheezing, rales or rhonchi. Abdominal: Soft, mild diffuse tenderness without rebound or guarding, nondistended. Bowel sounds active throughout. There are no masses palpable. No hepatosplenomegaly. Extremities: no clubbing, cyanosis, or edema Lymphadenopathy: No cervical adenopathy noted. Neurological: Alert and oriented to person place and time. Skin: Skin is warm and dry. No rashes noted. Psychiatric: Normal mood and affect. Behavior is normal.  RELEVANT LABS AND IMAGING: CBC    Component Value Date/Time   WBC 6.8 08/19/2015 0945   RBC 4.95 08/19/2015 0945   HGB 15.1 (H) 08/19/2015 0945   HCT 44.9 08/19/2015 0945   PLT 312.0 08/19/2015 0945   MCV 90.6 08/19/2015 0945   MCH 31.1 05/31/2013 2358   MCHC 33.5 08/19/2015 0945   RDW 13.1 08/19/2015 0945   LYMPHSABS 1.8 08/19/2015 0945   MONOABS 0.4 08/19/2015 0945   EOSABS 0.1 08/19/2015 0945   BASOSABS 0.0 08/19/2015 0945    CMP     Component Value Date/Time   NA 142 08/19/2015 0945   K 4.2 08/19/2015 0945   CL 105 08/19/2015 0945   CO2 29 08/19/2015 0945   GLUCOSE 109 (H) 08/19/2015 0945    BUN 14 08/19/2015 0945   CREATININE 0.72 08/19/2015 0945   CALCIUM 9.8 08/19/2015 0945   PROT 7.4 08/19/2015 0945   ALBUMIN 4.5 08/19/2015 0945   AST 16 08/19/2015 0945   ALT 13 08/19/2015 0945   ALKPHOS 65 08/19/2015 0945   BILITOT 0.4 08/19/2015 0945   GFRNONAA 72 (L) 11/04/2013 1420   GFRAA 84 (L) 11/04/2013 1420   ULTRASOUND ABDOMEN COMPLETE   COMPARISON:  Abdomen series 11/04/2013 .   FINDINGS: Gallbladder: Cholecystectomy .   Common bile duct: Diameter: 2 mm.   Liver: No focal lesion identified. Within normal limits in parenchymal echogenicity.   IVC: No abnormality visualized.   Pancreas: Visualized portion unremarkable.   Spleen: Size and appearance within normal limits.   Right Kidney: Length: 10.8 cm. Echogenicity within normal limits. No mass or hydronephrosis visualized.   Left Kidney: Length: 10.5 cm. Echogenicity  within normal limits. No mass or hydronephrosis visualized.   Abdominal aorta: No aneurysm visualized.   Other findings: None.   IMPRESSION: 1. Cholecystectomy.  No biliary distention. 2. No acute abnormality otherwise noted.  No hydronephrosis.     Electronically Signed   By: Marcello Moores  Register   On: 08/16/2014 09:02   CT ABDOMEN AND PELVIS WITHOUT CONTRAST   Technique:  Multidetector CT imaging of the abdomen and pelvis was performed following the standard protocol without intravenous contrast.   Comparison: None.   Findings: There are at least three nonobstructing calculi in the left kidney.  The largest is a 5 mm calculus in the lower pole collecting system.  There is suggestion of mild dilatation of the renal pelvis and mild dilatation of the left ureter relative to the right. The patient appears to have passed a ureteral calculus with a 3 mm stone visualized in the dependent bladder.  The right kidney and ureter are normal.   The unenhanced appearance of the liver, spleen, pancreas, adrenal glands and bowel are unremarkable.   The gallbladder has been removed.  No abnormal fluid collections are seen.  No incidental masses or enlarged lymph nodes.  No hernias are identified.  Bony structures show a minimal anterolisthesis of the L4 vertebral body on L5 of approximately 5 mm.   IMPRESSION: Nonobstructing calculi identified in the left kidney.  Mild dilatation of the left renal pelvis and ureter with a 3 mm calculus present in the bladder.     Original Report Authenticated By: Aletta Edouard, M.D.  ASSESSMENT/PLAN:  55 year old female with a past medical history of GERD, hiatal hernia, IBS who is seen in follow-up. She saw Dr. Sharlett Iles for many years and then Dr. Deatra Ina once, last in 2015.   1. GERD with epigastric pain -- symptoms could be that of GERD, dyspepsia or even gastritis. I recommended a trial of pantoprazole 40 mg daily. If this discomfort or heartburn feels improved with PPI therapy, upper endoscopy is recommended.  2. IBS/chronic loose stools with rare constipation -- symptoms most likely that of IBS. Check GI pathogen panel, fecal elastase and fecal calprotectin. If this evaluation is negative I recommended trial of rifaximin 550 mg 3 times a day 14 days. If no benefit, consider repeat colonoscopy.  Three-month follow-up, sooner if necessary 25 minutes spent with the patient today. Greater than 50% was spent in counseling and coordination of care with the patient     [id number] E [place], [place] [address] [place], [place] [address]

## 2016-07-07 ENCOUNTER — Telehealth: Payer: Self-pay | Admitting: Internal Medicine

## 2016-07-07 NOTE — Telephone Encounter (Signed)
Office visit note faxed.

## 2016-07-09 ENCOUNTER — Other Ambulatory Visit: Payer: 59

## 2016-07-09 DIAGNOSIS — K580 Irritable bowel syndrome with diarrhea: Secondary | ICD-10-CM

## 2016-07-10 LAB — GASTROINTESTINAL PATHOGEN PANEL PCR
C. DIFFICILE TOX A/B, PCR: NOT DETECTED
CAMPYLOBACTER, PCR: NOT DETECTED
CRYPTOSPORIDIUM, PCR: NOT DETECTED
E coli (ETEC) LT/ST PCR: NOT DETECTED
E coli (STEC) stx1/stx2, PCR: NOT DETECTED
E coli 0157, PCR: NOT DETECTED
GIARDIA LAMBLIA, PCR: NOT DETECTED
NOROVIRUS, PCR: NOT DETECTED
ROTAVIRUS, PCR: NOT DETECTED
Salmonella, PCR: NOT DETECTED
Shigella, PCR: NOT DETECTED

## 2016-07-15 LAB — CALPROTECTIN: Calprotectin: <15.6 mcg/g (ref <=162.9)

## 2016-07-15 LAB — PANCREATIC ELASTASE, FECAL

## 2016-07-16 ENCOUNTER — Telehealth: Payer: Self-pay | Admitting: Internal Medicine

## 2016-07-16 NOTE — Telephone Encounter (Signed)
Left message for pt to call back.  Let pt know that her lab results are normal.  Dottie do you know about this xifaxan and [facility]?

## 2016-07-16 NOTE — Telephone Encounter (Signed)
Left message for patient with phone number to [facility] to give her insurance information so we can move forward with getting Xifaxan.

## 2016-07-16 NOTE — Telephone Encounter (Signed)
I have left a voicemail for patient that she needs to contact [place] at [facility] with insurance information so that we may go forward with xifaxan prescription.

## 2016-08-04 ENCOUNTER — Ambulatory Visit: Payer: 59 | Admitting: Gastroenterology

## 2016-10-06 ENCOUNTER — Ambulatory Visit (INDEPENDENT_AMBULATORY_CARE_PROVIDER_SITE_OTHER): Payer: 59 | Admitting: Internal Medicine

## 2016-10-06 ENCOUNTER — Encounter: Payer: Self-pay | Admitting: Internal Medicine

## 2016-10-06 VITALS — BP 128/76 | HR 92 | Ht 65.5 in | Wt 167.2 lb

## 2016-10-06 DIAGNOSIS — K219 Gastro-esophageal reflux disease without esophagitis: Secondary | ICD-10-CM | POA: Diagnosis not present

## 2016-10-06 DIAGNOSIS — K580 Irritable bowel syndrome with diarrhea: Secondary | ICD-10-CM

## 2016-10-06 MED ORDER — PANTOPRAZOLE SODIUM 40 MG PO TBEC: 40.0000 mg | DELAYED_RELEASE_TABLET | Freq: Every day | ORAL | 1 refills | Status: DC

## 2016-10-06 NOTE — Patient Instructions (Signed)
Please continue pantoprazole every morning.  Add famotidine 20 mg every evening.  We have given you a "FODMAP" diet to look over and follow.  Please follow up with Dr Hilarie Fredrickson in 6 months.  If you are age 55 or older, your body mass index should be between 23-30. Your Body mass index is 27.41 kg/m. If this is out of the aforementioned range listed, please consider follow up with your Primary Care Provider.  If you are age 28 or younger, your body mass index should be between 19-25. Your Body mass index is 27.41 kg/m. If this is out of the aformentioned range listed, please consider follow up with your Primary Care Provider.

## 2016-10-07 NOTE — Progress Notes (Signed)
   Subjective:    Patient ID: Katrina Ford, female    DOB: 1961/08/01, 55 y.o.   MRN: [id number]  HPI Preslynn Gornick is a 55 yo female with PMH of GERD, IBS, who is here for follow-up. She was last seen in September 2017. We started pantoprazole last time for GERD with epigastric pain. This has improved her symptoms significantly. She still had some mild hoarseness and cough Pepcid on some evenings with good result. No recent dysphagia and odynophagia. She knows the triggers are lactose, spicy foods and eggnog.  She was treated with rifaximin for IBS with diarrhea she reports this helped tremendously. Her bowel movements now are pretty normal. She can still have occasional loose stools but now she can even skip a day without bowel movement. Her stool studies including calprotectin in the last days were negative.  Review of Systems As per history of present illness, otherwise negative  Current Medications, Allergies, Past Medical History, Past Surgical History, Family History and Social History were reviewed in [employer] record.     Objective:   Physical Exam BP 128/76 (BP Location: Left Arm, Patient Position: Sitting, Cuff Size: Normal)   Pulse 92   Ht 5' 5.5" (1.664 m) Comment: height measured without shoes  Wt 167 lb 4 oz (75.9 kg)   BMI 27.41 kg/m  Constitutional: Well-developed and well-nourished. No distress. HEENT: Normocephalic and atraumatic.  Conjunctivae are normal.  No scleral icterus. Neck: Neck supple. Trachea midline. Cardiovascular: Normal rate, regular rhythm and intact distal pulses. No M/R/G Pulmonary/chest: Effort normal and breath sounds normal. No wheezing, rales or rhonchi. Abdominal: Soft, nontender, nondistended. Bowel sounds active throughout.  Extremities: no clubbing, cyanosis, or edema Neurological: Alert and oriented to person place and time. Skin: Skin is warm and dry. No rashes noted. Psychiatric: Normal mood and affect. Behavior is  normal.      Assessment & Plan:  55 yo female with PMH of GERD, IBS, who is here for follow-up  1. GERD with epigastric pain -- improved tremendously with pantoprazole. We'll continue pantoprazole 40 mg daily. Have famotidine 20 mg every evening on an as-needed basis.  2. IBS-D -- negative stool evaluation. Near resolution of symptoms after rifaximin. We spent time today discussing possible need for retreatment in the future and initiation of a low FODMAP diet.  We reviewed the diet today together.  6 month ROV, sooner PRN 25 minutes spent with the patient today. Greater than 50% was spent in counseling and coordination of care with the patient

## 2016-10-13 LAB — HM MAMMOGRAPHY

## 2016-10-15 ENCOUNTER — Encounter: Payer: Self-pay | Admitting: General Practice

## 2016-10-23 LAB — HEPATIC FUNCTION PANEL
ALT: 13 U/L (ref 7–35)
AST: 16 U/L (ref 13–35)
Alkaline Phosphatase: 68 U/L (ref 25–125)

## 2016-10-23 LAB — CBC AND DIFFERENTIAL
HCT: 41 % (ref 36–46)
HCT: 41 % (ref 36–46)
Hemoglobin: 13.9 g/dL (ref 12.0–16.0)
Hemoglobin: 13.9 g/dL (ref 12.0–16.0)
PLATELETS: 275 10*3/uL (ref 150–399)
Platelets: 275 10*3/uL (ref 150–399)
WBC: 6.8 10*3/mL
WBC: 6.8 10*3/mL

## 2016-10-23 LAB — BASIC METABOLIC PANEL
BUN: 17 mg/dL (ref 4–21)
BUN: 17 mg/dL (ref 4–21)
CREATININE: 0.8 mg/dL (ref 0.5–1.1)
CREATININE: 0.8 mg/dL (ref 0.5–1.1)
GLUCOSE: 105 mg/dL
GLUCOSE: 105 mg/dL
Potassium: 4.6 mmol/L (ref 3.4–5.3)
Potassium: 4.6 mmol/L (ref 3.4–5.3)
SODIUM: 141 mmol/L (ref 137–147)
Sodium: 141 mmol/L (ref 137–147)

## 2016-11-25 ENCOUNTER — Telehealth: Payer: Self-pay | Admitting: Family Medicine

## 2016-11-25 NOTE — Telephone Encounter (Signed)
Patient scheduled for 11/26/2016

## 2016-11-25 NOTE — Telephone Encounter (Signed)
Pt has not been seen since 2016 we cannot refer without an OV.

## 2016-11-25 NOTE — Telephone Encounter (Signed)
Pt is asking for a referral to a new rhematologist, pt states that she is having issues with the office staff of the one she has been going to.

## 2016-11-25 NOTE — Telephone Encounter (Signed)
Ok to switch 

## 2016-11-25 NOTE — Telephone Encounter (Signed)
Patient would like to transfer from Dr. Birdie Riddle to [place] please advise

## 2016-11-25 NOTE — Telephone Encounter (Signed)
ok 

## 2016-11-26 ENCOUNTER — Encounter: Payer: Self-pay | Admitting: Family Medicine

## 2016-11-26 ENCOUNTER — Ambulatory Visit (INDEPENDENT_AMBULATORY_CARE_PROVIDER_SITE_OTHER): Payer: 59 | Admitting: Family Medicine

## 2016-11-26 VITALS — BP 132/57 | HR 76 | Temp 98.3°F | Ht 65.75 in | Wt 164.6 lb

## 2016-11-26 DIAGNOSIS — M5431 Sciatica, right side: Secondary | ICD-10-CM | POA: Diagnosis not present

## 2016-11-26 DIAGNOSIS — M3500 Sicca syndrome, unspecified: Secondary | ICD-10-CM

## 2016-11-26 MED ORDER — CYCLOBENZAPRINE HCL 10 MG PO TABS: 10.0000 mg | ORAL_TABLET | Freq: Two times a day (BID) | ORAL | 0 refills | Status: DC | PRN

## 2016-11-26 NOTE — Progress Notes (Signed)
[place] at [facility] [address], [place], [place] [id number] [phone]  Date:  11/26/2016   Name:  Katrina Ford   DOB:  [birth year]   MRN:  [id number]  PCP:  Annye Asa, MD    Chief Complaint: Transferring Care (Pt here to transfer care. Former pt of Dr. Birdie Riddle. c/o pain in right hip that radiates down legs into toes x 1 week. Hx of  fibromyalgia and sjogren syndrome.)   History of Present Illness:  Katrina Ford is a 56 y.o. very pleasant female patient who presents with the following:  Here today as a new patient to me.  She had been seeing Dr. Birdie Riddle prior to her move to [facility]- last visit with her in 2016.  History of OSA, IBS, fibromyalgia and sjogren's syndrome  She is on plaquenil, mobic, protonix, methotrexate once a week.  She sees Dr. Gemma Payor for GI Dr Elsworth Soho for Pulmonology - sleep apnea She admits that she has not been using her CPAP machine- her eyes get so dry from air blowing out from her mask.   Rheumatology- she is seeing Dr. Earnest Rosier- she would like to change to another provider however as she does not like her staff much although she does like Dr. 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  It bothers her at night and has made it hard for her to sleep She is not aware of any injury to her back or hip She does not notice any back pain The pain will run down the outside of her leg She notes pain, but cannot determine if it feels like nerve pain.   Adducting the hip (like in crossing her legs) is especially painful The leg is not numb.  It is not necessarily weak but it is painful to move She is on mobic already- she has tried tylenol as well but it did not really help No problems with bowel or bladder control  She takes 10 mg of methotrexate on  saturdays   Patient Active Problem List   Diagnosis Date Noted  . Urine incontinence 10/03/2015  . Polyarthralgia 08/19/2015  . OSA (obstructive sleep apnea) 08/19/2015  . Dysuria 12/18/2014  . Acute maxillary sinusitis 12/18/2014  . Physical exam 08/14/2014  . Menopausal vaginal dryness 08/14/2014  . Skin lesion of scalp 04/27/2014  . Depression 01/05/2014  . Special screening for malignant neoplasms, colon 10/05/2011  . Abdominal pain, RLQ (right lower quadrant) 10/05/2011  . IBS (irritable bowel syndrome) 10/05/2011  . Abdominal pain 10/01/2011  . GERD (gastroesophageal reflux disease) 10/01/2011  . S/P cholecystectomy 10/01/2011  . Constipation, slow transit 10/01/2011  . Abnormal liver enzymes 10/01/2011  . IRRITABLE BOWEL SYNDROME 07/18/2010  . GERD 12/01/2007  . HIATAL HERNIA 12/01/2007  . HEADACHE, CHRONIC 12/01/2007  . GASTRITIS 07/27/2000    Past Medical History:  Diagnosis Date  . Chronic headache   . Depression   . Family history of malignant neoplasm of gastrointestinal tract   . Frozen shoulder syndrome   . Gastritis   . GERD (gastroesophageal reflux disease)   . Hiatal hernia   . IBS (irritable bowel syndrome)   . Kidney stones   . Sjogren's disease [facility])     Past Surgical History:  Procedure Laterality Date  . CHOLECYSTECTOMY  2000  .  ESOPHAGEAL MANOMETRY  11/28/2012   Procedure: ESOPHAGEAL MANOMETRY (EM);  Surgeon: Sable Feil, MD;  Location: [facility];  Service: Endoscopy;  Laterality: N/A;  . FOOT SURGERY Bilateral 03/2013  . LITHOTRIPSY  [id number]  . TONSILLECTOMY      Social History  Substance Use Topics  . Smoking status: Never Smoker  . Smokeless tobacco: Never Used  . Alcohol use No    Family History  Problem Relation Age of Onset  . Diabetes Mother   . Heart disease Mother   . Diverticulitis Mother   . Hyperlipidemia Mother   . Hyperlipidemia Father   . Cancer Father     skin cancer  . Fibromyalgia Cousin   .  Fibromyalgia Cousin   . Fibromyalgia Cousin   . Lupus Cousin   . Lupus Cousin   . Colon cancer Maternal Uncle 26    Passed away in February 24, 2009  . Heart disease Paternal Grandmother     Siblings also  . Hyperlipidemia Brother   . Hyperlipidemia Sister   . Breast cancer Paternal Aunt     No Known Allergies  Medication list has been reviewed and updated.  Current Outpatient Prescriptions on File Prior to Visit  Medication Sig Dispense Refill  . Cholecalciferol (VITAMIN D3) 3000 units TABS Take 1 tablet by mouth daily.    . diclofenac sodium (VOLTAREN) 1 % GEL Apply 1 application topically daily as needed.    . hydroxychloroquine (PLAQUENIL) 200 MG tablet     . meloxicam (MOBIC) 15 MG tablet Take 15 mg by mouth daily.    . pantoprazole (PROTONIX) 40 MG tablet Take 1 tablet (40 mg total) by mouth daily. 90 tablet 1   No current facility-administered medications on file prior to visit.     Review of Systems:  As per HPI- otherwise negative. No bowel or bladder incont No fever, chills, nausea, vomiting or diarrhea   Physical Examination: Blood pressure (!) 132/57, pulse 76, temperature 98.3 F (36.8 C), temperature source Oral, height 5' 5.75" (1.67 m), weight 164 lb 9.6 oz (74.7 kg), SpO2 94 %.  GEN: WDWN, NAD, Non-toxic, A & O x 3, looks well HEENT: Atraumatic, Normocephalic. Neck supple. No masses, No LAD.  Bilateral TM wnl, oropharynx normal.  PEERL,EOMI.   Ears and Nose: No external deformity. CV: RRR, No M/G/R. No JVD. No thrill. No extra heart sounds. PULM: CTA B, no wheezes, crackles, rhonchi. No retractions. No resp. distress. No accessory muscle use. EXTR: No c/c/e NEURO Normal gait.  PSYCH: Normally interactive. Conversant. Not depressed or anxious appearing.  Calm demeanor.  Positive pain with SLR on the RIGHT only. She is tender over the right sciatic notch Normal lumbar flexion and extension but she does have discomfort with extension Normal BLE strength, sensation and  DTR.  No saddle anesthesia   Assessment and Plan: Sciatica, right side - Plan: cyclobenzaprine (FLEXERIL) 10 MG tablet  Sjogren's syndrome, with unspecified organ involvement ([place]) - Plan: Ambulatory referral to Rheumatology  Here today with sx of sciatica Will try flexeril as needed for pain. If not helpful in a few days we can try prednisone Referral to a different rheum as per her request   It was a pleasure to see you today We will try some flexeril (muscle relaxer) for your sciatic nerve pain.  You can also continue your mobic Heat, tennis ball massage can also be helpful If this does not relive your pain I would suggest that we try prednisone- please let me know  if your symptoms persist!  Remember the flexeril will make you feel sleepy- don't use it if you need to drive I will also refer you to see [facility] Rheumatology for your Sjogren's care   Signed Lamar Blinks, MD

## 2016-11-26 NOTE — Progress Notes (Signed)
Pre visit review using our clinic review tool, if applicable. No additional management support is needed unless otherwise documented below in the visit note. 

## 2016-11-26 NOTE — Patient Instructions (Signed)
It was a pleasure to see you today We will try some flexeril (muscle relaxer) for your sciatic nerve pain.  You can also continue your mobic Heat, tennis ball massage can also be helpful If this does not relive your pain I would suggest that we try prednisone- please let me know if your symptoms persist!  Remember the flexeril will make you feel sleepy- don't use it if you need to drive I will also refer you to see [facility] Rheumatology for your Sjogren's care

## 2016-11-27 NOTE — Telephone Encounter (Signed)
Spoke to pt regarding referral, pt states that she will try and get appt with new rhematologist herself. If not able to she will call us back.

## 2016-12-01 ENCOUNTER — Telehealth: Payer: Self-pay | Admitting: Family Medicine

## 2016-12-01 ENCOUNTER — Encounter: Payer: Self-pay | Admitting: Family Medicine

## 2016-12-01 DIAGNOSIS — M5441 Lumbago with sciatica, right side: Secondary | ICD-10-CM

## 2016-12-01 MED ORDER — PREDNISONE 20 MG PO TABS: ORAL_TABLET | ORAL | 0 refills | Status: DC

## 2016-12-01 NOTE — Telephone Encounter (Signed)
Called and LMOM- we had discussed trying prednisone if the muscle relaxer did not work.  Will rx this for her and will also send her a mychart message in case she needs to reach me

## 2016-12-01 NOTE — Telephone Encounter (Signed)
Patient called stated she is still have issues with sciatic and muscle relaxer is not helping. Patient asking for something else for the pain says she is not sleeping well at night.call back number is [phone]

## 2016-12-02 MED ORDER — TRAMADOL HCL 50 MG PO TABS: 50.0000 mg | ORAL_TABLET | Freq: Four times a day (QID) | ORAL | 0 refills | Status: DC | PRN

## 2016-12-02 NOTE — Telephone Encounter (Signed)
Called and spoke with her- she is having more severe pain and tingling into her right leg down to her toes. No bowel or bladder incont.  She has used tramadol in the past and would like to try this.  Would also like to go ahead and schedule an MRI which is reasonable.

## 2016-12-05 ENCOUNTER — Ambulatory Visit (HOSPITAL_BASED_OUTPATIENT_CLINIC_OR_DEPARTMENT_OTHER): Payer: 59

## 2017-01-05 DIAGNOSIS — M3500 Sicca syndrome, unspecified: Secondary | ICD-10-CM | POA: Diagnosis not present

## 2017-01-05 DIAGNOSIS — M150 Primary generalized (osteo)arthritis: Secondary | ICD-10-CM | POA: Diagnosis not present

## 2017-01-05 DIAGNOSIS — M797 Fibromyalgia: Secondary | ICD-10-CM | POA: Diagnosis not present

## 2017-01-06 DIAGNOSIS — H524 Presbyopia: Secondary | ICD-10-CM | POA: Diagnosis not present

## 2017-01-06 DIAGNOSIS — H04123 Dry eye syndrome of bilateral lacrimal glands: Secondary | ICD-10-CM | POA: Diagnosis not present

## 2017-01-06 DIAGNOSIS — M3501 Sicca syndrome with keratoconjunctivitis: Secondary | ICD-10-CM | POA: Diagnosis not present

## 2017-01-06 DIAGNOSIS — H0016 Chalazion left eye, unspecified eyelid: Secondary | ICD-10-CM | POA: Diagnosis not present

## 2017-01-06 LAB — HEPATIC FUNCTION PANEL
ALT: 18 U/L (ref 7–35)
AST: 16 U/L (ref 13–35)
Alkaline Phosphatase: 62 U/L (ref 25–125)
Bilirubin, Total: 0.3 mg/dL

## 2017-01-06 LAB — BASIC METABOLIC PANEL
BUN: 15 mg/dL (ref 4–21)
CREATININE: 0.6 mg/dL (ref 0.5–1.1)
GLUCOSE: 117 mg/dL
POTASSIUM: 3.9 mmol/L (ref 3.4–5.3)
Sodium: 141 mmol/L (ref 137–147)

## 2017-01-06 LAB — CBC AND DIFFERENTIAL
HCT: 42 % (ref 36–46)
HEMOGLOBIN: 14.3 g/dL (ref 12.0–16.0)
Platelets: 285 10*3/uL (ref 150–399)
WBC: 5.2 10^3/mL

## 2017-01-11 ENCOUNTER — Ambulatory Visit (INDEPENDENT_AMBULATORY_CARE_PROVIDER_SITE_OTHER): Payer: 59 | Admitting: Family Medicine

## 2017-01-11 VITALS — BP 122/78 | HR 84 | Temp 98.2°F | Ht 66.0 in | Wt 161.6 lb

## 2017-01-11 DIAGNOSIS — Z0000 Encounter for general adult medical examination without abnormal findings: Secondary | ICD-10-CM

## 2017-01-11 DIAGNOSIS — E559 Vitamin D deficiency, unspecified: Secondary | ICD-10-CM

## 2017-01-11 DIAGNOSIS — Z131 Encounter for screening for diabetes mellitus: Secondary | ICD-10-CM

## 2017-01-11 DIAGNOSIS — Z1159 Encounter for screening for other viral diseases: Secondary | ICD-10-CM

## 2017-01-11 DIAGNOSIS — Z1329 Encounter for screening for other suspected endocrine disorder: Secondary | ICD-10-CM | POA: Diagnosis not present

## 2017-01-11 DIAGNOSIS — Z13220 Encounter for screening for lipoid disorders: Secondary | ICD-10-CM

## 2017-01-11 LAB — LIPID PANEL
CHOL/HDL RATIO: 4
Cholesterol: 199 mg/dL (ref 0–200)
HDL: 53.7 mg/dL (ref >39.00)
LDL CALC: 127 mg/dL — AB (ref 0–99)
NonHDL: 145.68
TRIGLYCERIDES: 94 mg/dL (ref 0.0–149.0)
VLDL: 18.8 mg/dL (ref 0.0–40.0)

## 2017-01-11 LAB — HEMOGLOBIN A1C: Hgb A1c MFr Bld: 5.8 % (ref 4.6–6.5)

## 2017-01-11 LAB — HEPATITIS C ANTIBODY: HCV Ab: NEGATIVE

## 2017-01-11 NOTE — Progress Notes (Signed)
Pre visit review using our clinic review tool, if applicable. No additional management support is needed unless otherwise documented below in the visit note. 

## 2017-01-11 NOTE — Patient Instructions (Signed)
It was very nice to see you today- I am glad that you are doing well!   Take care and I will be in touch with your labs asap Work on building more physical activity into your life- both exercise and more general activity are both helpful   Health Maintenance for Postmenopausal Women Menopause is a normal process in which your reproductive ability comes to an end. This process happens gradually over a span of months to years, usually between the ages of 60 and 62. Menopause is complete when you have missed 12 consecutive menstrual periods. It is important to talk with your health care provider about some of the most common conditions that affect postmenopausal women, such as heart disease, cancer, and bone loss (osteoporosis). Adopting a healthy lifestyle and getting preventive care can help to promote your health and wellness. Those actions can also lower your chances of developing some of these common conditions. What should I know about menopause? During menopause, you may experience a number of symptoms, such as:  Moderate-to-severe hot flashes.  Night sweats.  Decrease in sex drive.  Mood swings.  Headaches.  Tiredness.  Irritability.  Memory problems.  Insomnia. Choosing to treat or not to treat menopausal changes is an individual decision that you make with your health care provider. What should I know about hormone replacement therapy and supplements? Hormone therapy products are effective for treating symptoms that are associated with menopause, such as hot flashes and night sweats. Hormone replacement carries certain risks, especially as you become older. If you are thinking about using estrogen or estrogen with progestin treatments, discuss the benefits and risks with your health care provider. What should I know about heart disease and stroke? Heart disease, heart attack, and stroke become more likely as you age. This may be due, in part, to the hormonal changes that your  body experiences during menopause. These can affect how your body processes dietary fats, triglycerides, and cholesterol. Heart attack and stroke are both medical emergencies. There are many things that you can do to help prevent heart disease and stroke:  Have your blood pressure checked at least every 1-2 years. High blood pressure causes heart disease and increases the risk of stroke.  If you are 44-55 years old, ask your health care provider if you should take aspirin to prevent a heart attack or a stroke.  Do not use any tobacco products, including cigarettes, chewing tobacco, or electronic cigarettes. If you need help quitting, ask your health care provider.  It is important to eat a healthy diet and maintain a healthy weight.  Be sure to include plenty of vegetables, fruits, low-fat dairy products, and lean protein.  Avoid eating foods that are high in solid fats, added sugars, or salt (sodium).  Get regular exercise. This is one of the most important things that you can do for your health.  Try to exercise for at least 150 minutes each week. The type of exercise that you do should increase your heart rate and make you sweat. This is known as moderate-intensity exercise.  Try to do strengthening exercises at least twice each week. Do these in addition to the moderate-intensity exercise.  Know your numbers.Ask your health care provider to check your cholesterol and your blood glucose. Continue to have your blood tested as directed by your health care provider. What should I know about cancer screening? There are several types of cancer. Take the following steps to reduce your risk and to catch any cancer  development as early as possible. Breast Cancer  Practice breast self-awareness.  This means understanding how your breasts normally appear and feel.  It also means doing regular breast self-exams. Let your health care provider know about any changes, no matter how small.  If  you are 40 or older, have a clinician do a breast exam (clinical breast exam or CBE) every year. Depending on your age, family history, and medical history, it may be recommended that you also have a yearly breast X-ray (mammogram).  If you have a family history of breast cancer, talk with your health care provider about genetic screening.  If you are at high risk for breast cancer, talk with your health care provider about having an MRI and a mammogram every year.  Breast cancer (BRCA) gene test is recommended for women who have family members with BRCA-related cancers. Results of the assessment will determine the need for genetic counseling and BRCA1 and for BRCA2 testing. BRCA-related cancers include these types:  Breast. This occurs in males or females.  Ovarian.  Tubal. This may also be called fallopian tube cancer.  Cancer of the abdominal or pelvic lining (peritoneal cancer).  Prostate.  Pancreatic. Cervical, Uterine, and Ovarian Cancer  Your health care provider may recommend that you be screened regularly for cancer of the pelvic organs. These include your ovaries, uterus, and vagina. This screening involves a pelvic exam, which includes checking for microscopic changes to the surface of your cervix (Pap test).  For women ages 21-65, health care providers may recommend a pelvic exam and a Pap test every three years. For women ages 49-65, they may recommend the Pap test and pelvic exam, combined with testing for human papilloma virus (HPV), every five years. Some types of HPV increase your risk of cervical cancer. Testing for HPV may also be done on women of any age who have unclear Pap test results.  Other health care providers may not recommend any screening for nonpregnant women who are considered low risk for pelvic cancer and have no symptoms. Ask your health care provider if a screening pelvic exam is right for you.  If you have had past treatment for cervical cancer or a  condition that could lead to cancer, you need Pap tests and screening for cancer for at least 20 years after your treatment. If Pap tests have been discontinued for you, your risk factors (such as having a new sexual partner) need to be reassessed to determine if you should start having screenings again. Some women have medical problems that increase the chance of getting cervical cancer. In these cases, your health care provider may recommend that you have screening and Pap tests more often.  If you have a family history of uterine cancer or ovarian cancer, talk with your health care provider about genetic screening.  If you have vaginal bleeding after reaching menopause, tell your health care provider.  There are currently no reliable tests available to screen for ovarian cancer. Lung Cancer  Lung cancer screening is recommended for adults 77-15 years old who are at high risk for lung cancer because of a history of smoking. A yearly low-dose CT scan of the lungs is recommended if you:  Currently smoke.  Have a history of at least 30 pack-years of smoking and you currently smoke or have quit within the past 15 years. A pack-year is smoking an average of one pack of cigarettes per day for one year. Yearly screening should:  Continue until it has  been 15 years since you quit.  Stop if you develop a health problem that would prevent you from having lung cancer treatment. Colorectal Cancer  This type of cancer can be detected and can often be prevented.  Routine colorectal cancer screening usually begins at age 40 and continues through age 80.  If you have risk factors for colon cancer, your health care provider may recommend that you be screened at an earlier age.  If you have a family history of colorectal cancer, talk with your health care provider about genetic screening.  Your health care provider may also recommend using home test kits to check for hidden blood in your stool.  A  small camera at the end of a tube can be used to examine your colon directly (sigmoidoscopy or colonoscopy). This is done to check for the earliest forms of colorectal cancer.  Direct examination of the colon should be repeated every 5-10 years until age 50. However, if early forms of precancerous polyps or small growths are found or if you have a family history or genetic risk for colorectal cancer, you may need to be screened more often. Skin Cancer  Check your skin from head to toe regularly.  Monitor any moles. Be sure to tell your health care provider:  About any new moles or changes in moles, especially if there is a change in a mole's shape or color.  If you have a mole that is larger than the size of a pencil eraser.  If any of your family members has a history of skin cancer, especially at a young age, talk with your health care provider about genetic screening.  Always use sunscreen. Apply sunscreen liberally and repeatedly throughout the day.  Whenever you are outside, protect yourself by wearing long sleeves, pants, a wide-brimmed hat, and sunglasses. What should I know about osteoporosis? Osteoporosis is a condition in which bone destruction happens more quickly than new bone creation. After menopause, you may be at an increased risk for osteoporosis. To help prevent osteoporosis or the bone fractures that can happen because of osteoporosis, the following is recommended:  If you are 13-64 years old, get at least 1,000 mg of calcium and at least 600 mg of vitamin D per day.  If you are older than age 1 but younger than age 52, get at least 1,200 mg of calcium and at least 600 mg of vitamin D per day.  If you are older than age 72, get at least 1,200 mg of calcium and at least 800 mg of vitamin D per day. Smoking and excessive alcohol intake increase the risk of osteoporosis. Eat foods that are rich in calcium and vitamin D, and do weight-bearing exercises several times each  week as directed by your health care provider. What should I know about how menopause affects my mental health? Depression may occur at any age, but it is more common as you become older. Common symptoms of depression include:  Low or sad mood.  Changes in sleep patterns.  Changes in appetite or eating patterns.  Feeling an overall lack of motivation or enjoyment of activities that you previously enjoyed.  Frequent crying spells. Talk with your health care provider if you think that you are experiencing depression. What should I know about immunizations? It is important that you get and maintain your immunizations. These include:  Tetanus, diphtheria, and pertussis (Tdap) booster vaccine.  Influenza every year before the flu season begins.  Pneumonia vaccine.  Shingles vaccine.  Your health care provider may also recommend other immunizations. This information is not intended to replace advice given to you by your health care provider. Make sure you discuss any questions you have with your health care provider. Document Released: 12/11/2005 Document Revised: 05/08/2016 Document Reviewed: 07/23/2015 Elsevier Interactive Patient Education  2017 [employer].

## 2017-01-11 NOTE — Progress Notes (Addendum)
[place] at [facility] [address], [place], [place] [address] [phone]  Date:  01/11/2017   Name:  Katrina Ford   DOB:  21-Feb-1961   MRN:  [id number]  PCP:  Lamar Blinks, MD    Chief Complaint: Annual Exam (Pt here for CPE. Pt fasting for labs. )   History of Present Illness:  Katrina Ford is a 56 y.o. very pleasant female patient who presents with the following:  Here today as a newer patient to my practice, former pt of Dr. Birdie Riddle.  Seeking a CPE today She is now going to [place] and sees Dr. Trudie Reed- they are continuing her current meds at this time.  She started methotrexate and this does seem to be helping her overall.  She is also still on plaquenil  She sees Dr. Ronita Hipps for her OBG care.  She is UTD on her pap and mammogram  She is fasting today for labs We will check her cholesterol, hep C, A1c,vitamin D, TSH today She had a recent CMP and CBC per Dr. Trudie Reed and has a copy of these with her today  Family history of DM, her dad also has HTN  She is a never smoker, very rare alcohol Admits she does not get enough exercise  Patient Active Problem List   Diagnosis Date Noted  . Urine incontinence 10/03/2015  . Polyarthralgia 08/19/2015  . OSA (obstructive sleep apnea) 08/19/2015  . Physical exam 08/14/2014  . Menopausal vaginal dryness 08/14/2014  . Depression 01/05/2014  . Abdominal pain, RLQ (right lower quadrant) 10/05/2011  . Abdominal pain 10/01/2011  . Constipation, slow transit 10/01/2011  . Abnormal liver enzymes 10/01/2011  . IRRITABLE BOWEL SYNDROME 07/18/2010  . GERD 12/01/2007  . HIATAL HERNIA 12/01/2007  . HEADACHE, CHRONIC 12/01/2007  . GASTRITIS 07/27/2000    Past Medical History:  Diagnosis Date  . Chronic headache   . Depression   . Family history of malignant neoplasm of gastrointestinal tract   . Frozen shoulder syndrome   . Gastritis   . GERD (gastroesophageal  reflux disease)   . Hiatal hernia   . IBS (irritable bowel syndrome)   . Kidney stones   . Sjogren's disease [facility]     Past Surgical History:  Procedure Laterality Date  . CHOLECYSTECTOMY  2000  . ESOPHAGEAL MANOMETRY  11/28/2012   Procedure: ESOPHAGEAL MANOMETRY (EM);  Surgeon: Sable Feil, MD;  Location: [facility] ENDOSCOPY;  Service: Endoscopy;  Laterality: N/A;  . FOOT SURGERY Bilateral 03/2013  . LITHOTRIPSY  [id number]  . TONSILLECTOMY      Social History  Substance Use Topics  . Smoking status: Never Smoker  . Smokeless tobacco: Never Used  . Alcohol use No    Family History  Problem Relation Age of Onset  . Diabetes Mother   . Heart disease Mother   . Diverticulitis Mother   . Hyperlipidemia Mother   . Hyperlipidemia Father   . Cancer Father     skin cancer  . Fibromyalgia Cousin   . Fibromyalgia Cousin   . Fibromyalgia Cousin   . Lupus Cousin   . Lupus Cousin   . Colon cancer Maternal Uncle 74    Passed away in 02/02/09  . Heart disease Paternal Grandmother     Siblings also  . Hyperlipidemia Brother   . Hyperlipidemia Sister   . Breast cancer Paternal Aunt     No Known Allergies  Medication list has been reviewed and  updated.  Current Outpatient Prescriptions on File Prior to Visit  Medication Sig Dispense Refill  . Cholecalciferol (VITAMIN D3) 3000 units TABS Take 1 tablet by mouth daily.    . diclofenac sodium (VOLTAREN) 1 % GEL Apply 1 application topically daily as needed.    . folic acid (FOLVITE) 1 MG tablet TK 1 T PO QD  6  . hydroxychloroquine (PLAQUENIL) 200 MG tablet     . meloxicam (MOBIC) 15 MG tablet Take 15 mg by mouth daily.    . methotrexate (RHEUMATREX) 2.5 MG tablet TK 4 TS PO 1 TIME A WK  6  . pantoprazole (PROTONIX) 40 MG tablet Take 1 tablet (40 mg total) by mouth daily. 90 tablet 1  . traMADol (ULTRAM) 50 MG tablet Take 1 tablet (50 mg total) by mouth every 6 (six) hours as needed. 40 tablet 0   No current facility-administered  medications on file prior to visit.     Review of Systems:  As per HPI- otherwise negative. No skin concerns, no breast concerns   Physical Examination: Vitals:   01/11/17 0827  BP: 122/78  Pulse: 84  Temp: 98.2 F (36.8 C)   Vitals:   01/11/17 0827  Weight: 161 lb 9.6 oz (73.3 kg)  Height: 5\' 6"  (1.676 m)   Body mass index is 26.08 kg/m. Ideal Body Weight: Weight in (lb) to have BMI = 25: 154.6  GEN: WDWN, NAD, Non-toxic, A & O x 3, mild overweight, looks well HEENT: Atraumatic, Normocephalic. Neck supple. No masses, No LAD.  Bilateral TM wnl, oropharynx normal.  PEERL,EOMI.   Ears and Nose: No external deformity. CV: RRR, No M/G/R. No JVD. No thrill. No extra heart sounds. PULM: CTA B, no wheezes, crackles, rhonchi. No retractions. No resp. distress. No accessory muscle use. ABD: S, NT, ND, +BS. No rebound. No HSM. EXTR: No c/c/e NEURO Normal gait.  PSYCH: Normally interactive. Conversant. Not depressed or anxious appearing.  Calm demeanor.    Assessment and Plan: Physical exam  Screening for hyperlipidemia - Plan: Lipid panel  Screening for diabetes mellitus - Plan: Hemoglobin A1c  History of vitamin D deficiency - Plan: Vitamin D (25 hydroxy)  Screening for thyroid disorder - Plan: TSH  Encounter for hepatitis C screening test for low risk patient - Plan: Hepatitis C antibody  Here today for a CPE Encouraged physical activity for CV and joint health  Will abstract labs and fax pertinent to Dr. Trudie Reed from her prior rheumatologist Will plan further follow- up pending labs.   Signed Lamar Blinks, MD  Received her labs  3/13- will rx vitamin D for her  Results for orders placed or performed in visit on 01/11/17  Lipid panel  Result Value Ref Range   Cholesterol 199 0 - 200 mg/dL   Triglycerides 94.0 0.0 - 149.0 mg/dL   HDL 53.70 >39.00 mg/dL   VLDL 18.8 0.0 - 40.0 mg/dL   LDL Cholesterol 127 (H) 0 - 99 mg/dL   Total CHOL/HDL Ratio 4    NonHDL  145.68   Hemoglobin A1c  Result Value Ref Range   Hgb A1c MFr Bld 5.8 4.6 - 6.5 %  Hepatitis C antibody  Result Value Ref Range   HCV Ab NEGATIVE NEGATIVE  Vitamin D (25 hydroxy)  Result Value Ref Range   VITD 22.00 (L) 30.00 - 100.00 ng/mL  TSH  Result Value Ref Range   TSH 3.06 0.35 - 4.50 uIU/mL   Your cholesterol is overall good, and your thyroid  level is normal, hepatitis C screening is negative as expected.  Your vitamin D is pretty low- I am going to rx a once WEEKLY supplement for you to take for 12 weeks.  This will help to replenish your vitamin D stores.  Following that you can go back on your usual OTC daily vitamin D supplement.   Your A1c (average blood sugar over the previous 3 months) is in the pre-diabetes range.  This means you may be at higher risk of developing diabetes in the future.  Continue to follow a healthy diet and get regular exercise, and we can repeat your A1c in one year.   Let me know if any questions!

## 2017-01-12 ENCOUNTER — Encounter: Payer: Self-pay | Admitting: Family Medicine

## 2017-01-12 LAB — TSH: TSH: 3.06 u[IU]/mL (ref 0.35–4.50)

## 2017-01-12 LAB — VITAMIN D 25 HYDROXY (VIT D DEFICIENCY, FRACTURES): VITD: 22 ng/mL — ABNORMAL LOW (ref 30.00–100.00)

## 2017-01-12 MED ORDER — VITAMIN D (ERGOCALCIFEROL) 1.25 MG (50000 UNIT) PO CAPS: 50000.0000 [IU] | ORAL_CAPSULE | ORAL | 0 refills | Status: DC

## 2017-01-12 NOTE — Addendum Note (Signed)
Addended by: Lamar Blinks C on: 01/12/2017 01:46 PM   Modules accepted: Orders

## 2017-01-15 ENCOUNTER — Encounter: Payer: Self-pay | Admitting: Family Medicine

## 2017-01-15 NOTE — Progress Notes (Signed)
ANA Direct : Positive on 10/24/2015

## 2017-01-15 NOTE — Progress Notes (Signed)
C-Reactive Protein, Quant   1.9  Chloride, Serum 99 Carbon dioxide, Total 25 Calcium, Serum 9.6 Protein,Total Serum  6.7 Albumin, Serum 4.6

## 2017-02-10 ENCOUNTER — Other Ambulatory Visit: Payer: Self-pay | Admitting: Internal Medicine

## 2017-03-10 DIAGNOSIS — Z01419 Encounter for gynecological examination (general) (routine) without abnormal findings: Secondary | ICD-10-CM | POA: Diagnosis not present

## 2017-03-17 ENCOUNTER — Telehealth: Payer: Self-pay | Admitting: Family Medicine

## 2017-03-17 NOTE — Telephone Encounter (Signed)
Received notes from [employer].  It appears that a pap was done this month but I do not have the result available

## 2017-03-19 ENCOUNTER — Encounter: Payer: 59 | Admitting: Family Medicine

## 2017-04-01 ENCOUNTER — Encounter: Payer: Self-pay | Admitting: Internal Medicine

## 2017-04-01 ENCOUNTER — Ambulatory Visit (INDEPENDENT_AMBULATORY_CARE_PROVIDER_SITE_OTHER): Payer: 59 | Admitting: Internal Medicine

## 2017-04-01 VITALS — BP 126/72 | HR 70 | Ht 65.75 in | Wt 160.4 lb

## 2017-04-01 DIAGNOSIS — G8929 Other chronic pain: Secondary | ICD-10-CM

## 2017-04-01 DIAGNOSIS — R1013 Epigastric pain: Secondary | ICD-10-CM | POA: Diagnosis not present

## 2017-04-01 DIAGNOSIS — K580 Irritable bowel syndrome with diarrhea: Secondary | ICD-10-CM | POA: Diagnosis not present

## 2017-04-01 DIAGNOSIS — K219 Gastro-esophageal reflux disease without esophagitis: Secondary | ICD-10-CM

## 2017-04-01 DIAGNOSIS — M792 Neuralgia and neuritis, unspecified: Secondary | ICD-10-CM | POA: Diagnosis not present

## 2017-04-01 MED ORDER — PANTOPRAZOLE SODIUM 40 MG PO TBEC: DELAYED_RELEASE_TABLET | ORAL | 0 refills | Status: DC

## 2017-04-01 NOTE — Patient Instructions (Signed)
We have sent the following medications to your pharmacy for you to pick up at your convenience: Protonix 40 mg twice daily x 2-4 weeks. (if your burning pain does not improve after this time period, please call us. We may need to schedule an endoscopy).  Take Pepcid every night when needed.  Follow up in 9 months, sooner if needed.  If you are age 56 or older, your body mass index should be between 23-30. Your Body mass index is 26.09 kg/m. If this is out of the aforementioned range listed, please consider follow up with your Primary Care Provider.  If you are age 48 or younger, your body mass index should be between 19-25. Your Body mass index is 26.09 kg/m. If this is out of the aformentioned range listed, please consider follow up with your Primary Care Provider.

## 2017-04-02 NOTE — Progress Notes (Signed)
Subjective:    Patient ID: Katrina Ford, female    DOB: 08-28-1961, 56 y.o.   MRN: [id number]  HPI Katrina Ford a 56 year old female with a history of GERD, IBS who is here for follow-up. She also has a history of Sjogren's disease, kidney stones, and fibromyalgia.  She reports that today she is feeling somewhat poorly because she's had some burning left arm pain. She states she has a history of cervical disc disease and a "pinched nerve". From a GI perspective she has been feeling better. She continues pantoprazole 40 mg which she says helps a lot. She is having issues with burning epigastric pain which seems to be worse over the last few weeks. She also has noticed a bit more belching. She denies trouble swallowing including dysphagia and odynophagia. Nausea overall has improved. Bowel movements have been much better she was treated previously with rifaximin. She is now having fairly normal bowel movements about once per day though she can occasionally skip days without a bowel movement.  She does have a new visit with Dr. Lenna Gilford with rheumatology. She is switching care from Dr. Francoise Ceo.  She is taking Plaquenil, methotrexate, Mobic, folic acid, and vitamin D along with the previously mentioned pantoprazole  No fevers chills or chest pain. No shortness of breath  Review of Systems As per history of present illness, otherwise negative  Current Medications, Allergies, Past Medical History, Past Surgical History, Family History and Social History were reviewed in [employer] record.     Objective:   Physical Exam BP 126/72   Pulse 70   Ht 5' 5.75" (1.67 m)   Wt 160 lb 6.4 oz (72.8 kg)   BMI 26.09 kg/m  Constitutional: Well-developed and well-nourished. No distress. HEENT: Normocephalic and atraumatic. Oropharynx is clear and moist. Conjunctivae are normal.  No scleral icterus. Neck: Neck supple. Trachea midline. Cardiovascular: Normal rate, regular rhythm and  intact distal pulses. No M/R/G Pulmonary/chest: Effort normal and breath sounds normal. No wheezing, rales or rhonchi. Abdominal: Soft, nontender, nondistended. Bowel sounds active throughout. There are no masses palpable. No hepatosplenomegaly. Extremities: no clubbing, cyanosis, or edema Neurological: Alert and oriented to person place and time. Skin: Skin is warm and dry. Psychiatric: Normal mood and affect. Behavior is normal.  CBC    Component Value Date/Time   WBC 5.2 01/06/2017   WBC 6.8 08/19/2015 0945   RBC 4.95 08/19/2015 0945   HGB 14.3 01/06/2017   HCT 42 01/06/2017   PLT 285 01/06/2017   MCV 90.6 08/19/2015 0945   MCH 31.1 05/31/2013 2358   MCHC 33.5 08/19/2015 0945   RDW 13.1 08/19/2015 0945   LYMPHSABS 1.8 08/19/2015 0945   MONOABS 0.4 08/19/2015 0945   EOSABS 0.1 08/19/2015 0945   BASOSABS 0.0 08/19/2015 0945   CMP     Component Value Date/Time   NA 141 01/06/2017   K 3.9 01/06/2017   CL 105 08/19/2015 0945   CO2 29 08/19/2015 0945   GLUCOSE 109 (H) 08/19/2015 0945   BUN 15 01/06/2017   CREATININE 0.6 01/06/2017   CREATININE 0.72 08/19/2015 0945   CALCIUM 9.8 08/19/2015 0945   PROT 7.4 08/19/2015 0945   ALBUMIN 4.5 08/19/2015 0945   AST 16 01/06/2017   ALT 18 01/06/2017   ALKPHOS 62 01/06/2017   BILITOT 0.4 08/19/2015 0945   GFRNONAA 72 (L) 11/04/2013 1420   GFRAA 84 (L) 11/04/2013 1420      Assessment & Plan:  56 year old female with a history  of GERD, IBS who is here for follow-up. She also has a history of Sjogren's disease, kidney stones, and fibromyalgia.   1. GERD with epigastric pain -- definitively improved with pantoprazole though slightly worse over the last couple weeks. Increase pantoprazole to 40 mg twice a day before meals 2-4 weeks. Once pain improves return to once daily dosing. Can use famotidine 20 mg daily at bedtime when necessary.  2. IBS-D -- ask our sponsor rifaximin. Bowel habits have normalized. May need repeat course of  rifaximin in the future should this problem again become an issue.  3. Left arm numbness -- possibly secondary to radicular symptom from history of cervical disc disease. I asked that she notify her primary care regarding this symptom if it persists or worsens. She voices understanding  25 minutes spent with the patient today. Greater than 50% was spent in counseling and coordination of care with the patient

## 2017-04-03 ENCOUNTER — Other Ambulatory Visit: Payer: Self-pay | Admitting: Internal Medicine

## 2017-04-08 DIAGNOSIS — M150 Primary generalized (osteo)arthritis: Secondary | ICD-10-CM | POA: Diagnosis not present

## 2017-04-08 DIAGNOSIS — M3500 Sicca syndrome, unspecified: Secondary | ICD-10-CM | POA: Diagnosis not present

## 2017-04-08 DIAGNOSIS — R5383 Other fatigue: Secondary | ICD-10-CM | POA: Diagnosis not present

## 2017-04-08 DIAGNOSIS — M064 Inflammatory polyarthropathy: Secondary | ICD-10-CM | POA: Diagnosis not present

## 2017-04-16 DIAGNOSIS — M25511 Pain in right shoulder: Secondary | ICD-10-CM | POA: Diagnosis not present

## 2017-04-19 DIAGNOSIS — M25511 Pain in right shoulder: Secondary | ICD-10-CM | POA: Diagnosis not present

## 2017-04-19 DIAGNOSIS — M4722 Other spondylosis with radiculopathy, cervical region: Secondary | ICD-10-CM | POA: Diagnosis not present

## 2017-04-27 ENCOUNTER — Other Ambulatory Visit: Payer: Self-pay | Admitting: Internal Medicine

## 2017-05-06 DIAGNOSIS — M7541 Impingement syndrome of right shoulder: Secondary | ICD-10-CM | POA: Diagnosis not present

## 2017-05-06 DIAGNOSIS — M3500 Sicca syndrome, unspecified: Secondary | ICD-10-CM | POA: Diagnosis not present

## 2017-06-17 ENCOUNTER — Encounter: Payer: Self-pay | Admitting: Family Medicine

## 2017-06-17 ENCOUNTER — Ambulatory Visit (INDEPENDENT_AMBULATORY_CARE_PROVIDER_SITE_OTHER): Payer: 59 | Admitting: Family Medicine

## 2017-06-17 VITALS — BP 102/70 | HR 96 | Temp 98.2°F | Ht 65.75 in | Wt 159.2 lb

## 2017-06-17 DIAGNOSIS — B9789 Other viral agents as the cause of diseases classified elsewhere: Secondary | ICD-10-CM

## 2017-06-17 DIAGNOSIS — H6983 Other specified disorders of Eustachian tube, bilateral: Secondary | ICD-10-CM

## 2017-06-17 DIAGNOSIS — J069 Acute upper respiratory infection, unspecified: Secondary | ICD-10-CM | POA: Diagnosis not present

## 2017-06-17 MED ORDER — METHYLPREDNISOLONE 4 MG PO TBPK: ORAL_TABLET | ORAL | 0 refills | Status: DC

## 2017-06-17 MED ORDER — BENZONATATE 100 MG PO CAPS: 100.0000 mg | ORAL_CAPSULE | Freq: Three times a day (TID) | ORAL | 0 refills | Status: DC | PRN

## 2017-06-17 NOTE — Progress Notes (Signed)
Chief Complaint  Patient presents with  . Cough    Patient is here today C/O nonproductive cough that started on 8.12.18. States that she feels that at night she feels that it is harder to breathe and it hurts through to her back.   Alford Highland here for URI complaints.  Duration: 4 days  Associated symptoms: sinus congestion, rhinorrhea, ear pain and nonproductive cough Denies: sinus pain, itchy watery eyes, ear drainage, sore throat and fevers/rigors Treatment to date: Zyrtec, Flonase Sick contacts: Yes- husband  ROS:  Const: Denies fevers HEENT: As noted in HPI Lungs: No current SOB  Past Medical History:  Diagnosis Date  . Chronic headache   . Depression   . Elevated hemoglobin A1c    pre diabetic  . Family history of malignant neoplasm of gastrointestinal tract   . Frozen shoulder syndrome   . Gastritis   . GERD (gastroesophageal reflux disease)   . Hiatal hernia   . IBS (irritable bowel syndrome)   . Kidney stones   . Sciatic leg pain    May 2018  . Sjogren's disease ([place])    Family History  Problem Relation Age of Onset  . Diabetes Mother   . Heart disease Mother   . Diverticulitis Mother   . Hyperlipidemia Mother   . Hyperlipidemia Father   . Cancer Father        skin cancer  . Fibromyalgia Cousin   . Fibromyalgia Cousin   . Fibromyalgia Cousin   . Lupus Cousin   . Lupus Cousin   . Colon cancer Maternal Uncle 2       Passed away in 20-Feb-2009  . Heart disease Paternal Grandmother        Siblings also  . Hyperlipidemia Brother   . Hyperlipidemia Sister   . Breast cancer Paternal Aunt     BP 102/70 (BP Location: Left Arm, Patient Position: Sitting, Cuff Size: Normal)   Pulse 96   Temp 98.2 F (36.8 C) (Oral)   Ht 5' 5.75" (1.67 m)   Wt 159 lb 3.2 oz (72.2 kg)   SpO2 98%   BMI 25.89 kg/m  General: Awake, alert, appears stated age HEENT: AT, [place], ears patent b/l and TM's retracted b/l, more so on L, nares patent w/o discharge, turbinate enlarged on  L, pharynx pink and without exudates, MMM Neck: No masses or asymmetry Heart: RRR, no murmurs, no bruits Lungs: CTAB, no accessory muscle use Psych: Age appropriate judgment and insight, normal mood and affect  Viral URI with cough - Plan: benzonatate (TESSALON) 100 MG capsule  ETD (Eustachian tube dysfunction), bilateral - Plan: methylPREDNISolone (MEDROL DOSEPAK) 4 MG TBPK tablet  Orders as above. Continue to push fluids, practice good hand hygiene, cover mouth when coughing. F/u prn. If starting to experience fevers, shaking, or shortness of breath, seek immediate care. Pt voiced understanding and agreement to the plan.  [place], DO 06/17/17 4:51 PM

## 2017-06-17 NOTE — Patient Instructions (Signed)
Continue to push fluids, practice good hand hygiene, and cover your mouth if you cough.  If you start having fevers, shaking or worsening shortness of breath, seek immediate care.  Let us know if you need anything.

## 2017-06-23 DIAGNOSIS — M797 Fibromyalgia: Secondary | ICD-10-CM | POA: Diagnosis not present

## 2017-06-23 DIAGNOSIS — M3500 Sicca syndrome, unspecified: Secondary | ICD-10-CM | POA: Diagnosis not present

## 2017-06-23 DIAGNOSIS — M2550 Pain in unspecified joint: Secondary | ICD-10-CM | POA: Diagnosis not present

## 2017-07-22 DIAGNOSIS — Z808 Family history of malignant neoplasm of other organs or systems: Secondary | ICD-10-CM | POA: Diagnosis not present

## 2017-07-22 DIAGNOSIS — L723 Sebaceous cyst: Secondary | ICD-10-CM | POA: Diagnosis not present

## 2017-07-22 DIAGNOSIS — D225 Melanocytic nevi of trunk: Secondary | ICD-10-CM | POA: Diagnosis not present

## 2017-08-17 ENCOUNTER — Encounter: Payer: Self-pay | Admitting: Family Medicine

## 2017-08-18 MED ORDER — GABAPENTIN 600 MG PO TABS: 600.0000 mg | ORAL_TABLET | Freq: Every day | ORAL | 6 refills | Status: DC

## 2017-08-18 NOTE — Addendum Note (Signed)
Addended by: Lamar Blinks C on: 08/18/2017 08:47 PM   Modules accepted: Orders

## 2017-09-19 ENCOUNTER — Other Ambulatory Visit: Payer: Self-pay | Admitting: Internal Medicine

## 2017-10-19 DIAGNOSIS — N6011 Diffuse cystic mastopathy of right breast: Secondary | ICD-10-CM | POA: Diagnosis not present

## 2017-12-02 DIAGNOSIS — M3500 Sicca syndrome, unspecified: Secondary | ICD-10-CM | POA: Diagnosis not present

## 2017-12-02 DIAGNOSIS — M2550 Pain in unspecified joint: Secondary | ICD-10-CM | POA: Diagnosis not present

## 2017-12-02 DIAGNOSIS — M797 Fibromyalgia: Secondary | ICD-10-CM | POA: Diagnosis not present

## 2017-12-28 ENCOUNTER — Encounter: Payer: Self-pay | Admitting: Family

## 2017-12-28 ENCOUNTER — Ambulatory Visit (INDEPENDENT_AMBULATORY_CARE_PROVIDER_SITE_OTHER): Payer: 59 | Admitting: Family

## 2017-12-28 VITALS — BP 127/66 | HR 89 | Temp 98.9°F | Resp 16 | Ht 65.7 in | Wt 160.0 lb

## 2017-12-28 DIAGNOSIS — B349 Viral infection, unspecified: Secondary | ICD-10-CM | POA: Diagnosis not present

## 2017-12-28 DIAGNOSIS — R6889 Other general symptoms and signs: Secondary | ICD-10-CM | POA: Diagnosis not present

## 2017-12-28 LAB — POCT INFLUENZA A/B
INFLUENZA B, POC: NEGATIVE
Influenza A, POC: NEGATIVE

## 2017-12-28 NOTE — Progress Notes (Signed)
Subjective:    Patient ID: Katrina Ford, female    DOB: 1961/04/03, 57 y.o.   MRN: [id number]  HPI  Pt is a 57 yr old female who presents with c/o cough which began about 1 week ago. Was mild.  This weekend she began to feel tired/weak, congested.  This AM she developed very sore throat."  Water seemed to help some but then it started again. Reports + laryngitis yesterday but better today. Has sinus pressure, ears full full/stopped up. Eyes watering, glands feel swollen.    Had diarrhea "all weekend." one loose stool today.  Had 3 loose stools today.   Review of Systems See HPI  Past Medical History:  Diagnosis Date  . Chronic headache   . Depression   . Elevated hemoglobin A1c    pre diabetic  . Family history of malignant neoplasm of gastrointestinal tract   . Frozen shoulder syndrome   . Gastritis   . GERD (gastroesophageal reflux disease)   . Hiatal hernia   . IBS (irritable bowel syndrome)   . Kidney stones   . Sciatic leg pain    May 2018  . Sjogren's disease ([place])      Social History   Socioeconomic History  . Marital status: Married    Spouse name: Not on file  . Number of children: 2  . Years of education: Not on file  . Highest education level: Not on file  Social Needs  . Financial resource strain: Not on file  . Food insecurity - worry: Not on file  . Food insecurity - inability: Not on file  . Transportation needs - medical: Not on file  . Transportation needs - non-medical: Not on file  Occupational History  . Not on file  Tobacco Use  . Smoking status: Never Smoker  . Smokeless tobacco: Never Used  Substance and Sexual Activity  . Alcohol use: No  . Drug use: No  . Sexual activity: Yes    Birth control/protection: None  Other Topics Concern  . Not on file  Social History Narrative  . Not on file    Past Surgical History:  Procedure Laterality Date  . CHOLECYSTECTOMY  2000  . ESOPHAGEAL MANOMETRY  11/28/2012   Procedure: ESOPHAGEAL  MANOMETRY (EM);  Surgeon: Sable Feil, MD;  Location: [facility];  Service: Endoscopy;  Laterality: N/A;  . FOOT SURGERY Bilateral 03/2013  . LITHOTRIPSY  [id number]  . TONSILLECTOMY      Family History  Problem Relation Age of Onset  . Diabetes Mother   . Heart disease Mother   . Diverticulitis Mother   . Hyperlipidemia Mother   . Hyperlipidemia Father   . Cancer Father        skin cancer  . Fibromyalgia Cousin   . Fibromyalgia Cousin   . Fibromyalgia Cousin   . Lupus Cousin   . Lupus Cousin   . Colon cancer Maternal Uncle 77       Passed away in 17-Feb-2009  . Heart disease Paternal Grandmother        Siblings also  . Hyperlipidemia Brother   . Hyperlipidemia Sister   . Breast cancer Paternal Aunt     No Known Allergies  Current Outpatient Medications on File Prior to Visit  Medication Sig Dispense Refill  . benzonatate (TESSALON) 100 MG capsule Take 1 capsule (100 mg total) by mouth 3 (three) times daily as needed. 30 capsule 0  . celecoxib (CELEBREX) 200 MG capsule Take 1 capsule by  mouth daily.    . Cholecalciferol (VITAMIN D3) 5000 units CAPS Take 1 capsule by mouth daily.    . diclofenac sodium (VOLTAREN) 1 % GEL Apply 1 application topically daily as needed.    . folic acid (FOLVITE) 1 MG tablet TK 1 T PO QD  6  . gabapentin (NEURONTIN) 600 MG tablet Take 1 tablet (600 mg total) by mouth daily. 30 tablet 6  . hydroxychloroquine (PLAQUENIL) 200 MG tablet 400 mg daily.     . methotrexate (RHEUMATREX) 2.5 MG tablet TK 4 TS PO 1 TIME A WK  6  . methylPREDNISolone (MEDROL DOSEPAK) 4 MG TBPK tablet Follow instructions on package. 21 tablet 0  . pantoprazole (PROTONIX) 40 MG tablet Take 1 tablet (40 mg total) daily by mouth. MUST HAVE OFFICE VISIT FOR FURTHER REFILLS 30 tablet 1   No current facility-administered medications on file prior to visit.     BP 127/66 (BP Location: Right Arm, Patient Position: Sitting, Cuff Size: Small)   Pulse 89   Temp 98.9 F (37.2 C)  (Oral)   Resp 16   Ht 5' 5.7" (1.669 m)   Wt 160 lb (72.6 kg)   SpO2 98%   BMI 26.06 kg/m       Objective:   Physical Exam  Constitutional: She appears well-developed and well-nourished.  HENT:  Head: Normocephalic and atraumatic.  Right Ear: Tympanic membrane and ear canal normal.  Left Ear: Tympanic membrane and ear canal normal.  Mouth/Throat: No oropharyngeal exudate, posterior oropharyngeal edema or posterior oropharyngeal erythema.  Eyes: Conjunctivae are normal.  Cardiovascular: Normal rate, regular rhythm and normal heart sounds.  No murmur heard. Pulmonary/Chest: Effort normal and breath sounds normal. No respiratory distress. She has no wheezes.  Lymphadenopathy:    She has no cervical adenopathy.  Psychiatric: She has a normal mood and affect. Her behavior is normal. Judgment and thought content normal.          Assessment & Plan:  Viral illness- Flu swab is negative. Advised pt as follows:    Continue hydration, rest.  You may use tylenol or motrin as needed for pain/fever. Mucinex, flonase as needed for congestion.  Call if new/worsening symptoms or if not improved in 3 days.

## 2017-12-28 NOTE — Patient Instructions (Signed)
Continue hydration, rest.  You may use tylenol or motrin as needed for pain/fever. Mucinex, flonase as needed for congestion.  Call if new/worsening symptoms or if not improved in 3 days.

## 2018-01-10 NOTE — Progress Notes (Addendum)
[place] [address] [place], [place] [address] [phone]  Date:  01/13/2018   Name:  Katrina Ford   DOB:  05/22/1961   MRN:  [id number]  PCP:  Darreld Mclean, MD    Chief Complaint: No chief complaint on file.   History of Present Illness:  Katrina Ford is a 57 y.o. very pleasant female patient who presents with the following:  Here today for annual CPE Seen a few weeks ago with a viral illness History of depression, OSA, pre-diabetes, sjogren's, polyarthralgia, vit D def  I last saw her a year ago for her CPE:  She is now going to [place] and sees Dr. Trudie Reed- they are continuing her current meds at this time.  She started methotrexate and this does seem to be helping her overall.  She is also still on plaquenil  She sees Dr. Ronita Hipps for her OBG care.  She is UTD on her pap and mammogram She is fasting today for labs We will check her cholesterol, hep C, A1c,vitamin D, TSH today She had a recent CMP and CBC per Dr. Trudie Reed and has a copy of these with her today Family history of DM, her dad also has HTN She is a never smoker, very rare alcohol Admits she does not get enough exercise  Pap: may of 18 Mammo: 12/18 Colon: 6/14, she was given a 10 year follow-up.   Flu: not done yet this year- however we think she likely had the flu in February when she was in but tested negative.  Tdap: 2012 Labs: due- she is fasting today  She skipped her methotrexate for a couple of weeks since she has been ill- she plans to take this week, her dose is on saturdays.   She has noted difficulty sleeping. She is using unisom most nights.  She is not kept up by pain- she has a hard time getting to sleep, and also then will wake up frequently This has been the case for some time, but worse the last 10 months or so.  Her mood has been worse recently, she thinks due to not sleeping.  She denies any SI  She  was dx with OSA, but tried sleeping with a machine and could not tolerate due to air blowing in her eyes She is not able to tolerate melatonin She avoids caffeine in the evening  She feels like her mood is worse due to not sleeping well.   She is watching her glucose, is using a keto diet She is trying to help her husband lose weight as well- they have recently joined the [facility] and plan to start exercising more   Wt Readings from Last 3 Encounters:  01/13/18 157 lb (71.2 kg)  12/28/17 160 lb (72.6 kg)  06/17/17 159 lb 3.2 oz (72.2 kg)     Lab Results  Component Value Date   HGBA1C 5.8 01/11/2017    Patient Active Problem List   Diagnosis Date Noted  . Urine incontinence 10/03/2015  . Polyarthralgia 08/19/2015  . OSA (obstructive sleep apnea) 08/19/2015  . Physical exam 08/14/2014  . Menopausal vaginal dryness 08/14/2014  . Depression 01/05/2014  . Abdominal pain, RLQ (right lower quadrant) 10/05/2011  . Abdominal pain 10/01/2011  . Constipation, slow transit 10/01/2011  . Abnormal liver enzymes 10/01/2011  . IRRITABLE BOWEL SYNDROME 07/18/2010  . GERD 12/01/2007  . HIATAL HERNIA 12/01/2007  . HEADACHE, CHRONIC 12/01/2007  .  GASTRITIS 07/27/2000    Past Medical History:  Diagnosis Date  . Chronic headache   . Depression   . Elevated hemoglobin A1c    pre diabetic  . Family history of malignant neoplasm of gastrointestinal tract   . Frozen shoulder syndrome   . Gastritis   . GERD (gastroesophageal reflux disease)   . Hiatal hernia   . IBS (irritable bowel syndrome)   . Kidney stones   . Sciatic leg pain    May 2018  . Sjogren's disease [facility])     Past Surgical History:  Procedure Laterality Date  . CHOLECYSTECTOMY  2000  . ESOPHAGEAL MANOMETRY  11/28/2012   Procedure: ESOPHAGEAL MANOMETRY (EM);  Surgeon: Sable Feil, MD;  Location: [facility];  Service: Endoscopy;  Laterality: N/A;  . FOOT SURGERY Bilateral 03/2013  . LITHOTRIPSY  [id number]  .  TONSILLECTOMY      Social History   Tobacco Use  . Smoking status: Never Smoker  . Smokeless tobacco: Never Used  Substance Use Topics  . Alcohol use: No  . Drug use: No    Family History  Problem Relation Age of Onset  . Diabetes Mother   . Heart disease Mother   . Diverticulitis Mother   . Hyperlipidemia Mother   . Hyperlipidemia Father   . Cancer Father        skin cancer  . Fibromyalgia Cousin   . Fibromyalgia Cousin   . Fibromyalgia Cousin   . Lupus Cousin   . Lupus Cousin   . Colon cancer Maternal Uncle 23       Passed away in 02/06/2009  . Heart disease Paternal Grandmother        Siblings also  . Hyperlipidemia Brother   . Hyperlipidemia Sister   . Breast cancer Paternal Aunt     No Known Allergies  Medication list has been reviewed and updated.  Current Outpatient Medications on File Prior to Visit  Medication Sig Dispense Refill  . benzonatate (TESSALON) 100 MG capsule Take 1 capsule (100 mg total) by mouth 3 (three) times daily as needed. 30 capsule 0  . celecoxib (CELEBREX) 200 MG capsule Take 1 capsule by mouth daily.    . Cholecalciferol (VITAMIN D3) 5000 units CAPS Take 1 capsule by mouth daily.    . diclofenac sodium (VOLTAREN) 1 % GEL Apply 1 application topically daily as needed.    . folic acid (FOLVITE) 1 MG tablet TK 1 T PO QD  6  . gabapentin (NEURONTIN) 600 MG tablet Take 1 tablet (600 mg total) by mouth daily. 30 tablet 6  . hydroxychloroquine (PLAQUENIL) 200 MG tablet 400 mg daily.     . methotrexate (RHEUMATREX) 2.5 MG tablet TK 4 TS PO 1 TIME A WK  6  . pantoprazole (PROTONIX) 40 MG tablet Take 1 tablet (40 mg total) daily by mouth. MUST HAVE OFFICE VISIT FOR FURTHER REFILLS 30 tablet 1   No current facility-administered medications on file prior to visit.     Review of Systems:  As per HPI- otherwise negative.   Physical Examination: Vitals:   01/13/18 0837  BP: 112/78  Pulse: 85  Temp: 98.2 F (36.8 C)  SpO2: 97%   Vitals:    01/13/18 0837  Weight: 157 lb (71.2 kg)  Height: 5' 5.75" (1.67 m)   Body mass index is 25.53 kg/m. Ideal Body Weight: Weight in (lb) to have BMI = 25: 153.4  GEN: WDWN, NAD, Non-toxic, A & O x 3, looks well  HEENT: Atraumatic, Normocephalic. Neck supple. No masses, No LAD.  Bilateral TM wnl, oropharynx normal.  PEERL,EOMI.   Ears and Nose: No external deformity. CV: RRR, No M/G/R. No JVD. No thrill. No extra heart sounds. PULM: CTA B, no wheezes, crackles, rhonchi. No retractions. No resp. distress. No accessory muscle use. ABD: S, NT, ND, +BS. No rebound. No HSM. EXTR: No c/c/e NEURO Normal gait.  PSYCH: Normally interactive. Conversant. Not depressed or anxious appearing.  Calm demeanor.    Assessment and Plan: Physical exam  Screening for hyperlipidemia - Plan: Lipid panel  Screening for diabetes mellitus - Plan: Comprehensive metabolic panel, Hemoglobin A1c  Vitamin D deficiency - Plan: Vitamin D (25 hydroxy)  Sjogren's syndrome, with unspecified organ involvement ([place]) - Plan: CBC  Primary insomnia - Plan: traZODone (DESYREL) 50 MG tablet  Here today for a CPE Labs pending as above She has GYN care Declines flu shot as so late in the season, encouraged her to have next year She has suffered from insomnia which she feels is contributing to depression- rx for trazodone. She will let me know how this works for her   Signed Lamar Blinks, MD  Received her labs  Results for orders placed or performed in visit on 01/13/18  CBC  Result Value Ref Range   WBC 5.0 4.0 - 10.5 K/uL   RBC 4.48 3.87 - 5.11 Mil/uL   Platelets 267.0 150.0 - 400.0 K/uL   Hemoglobin 14.4 12.0 - 15.0 g/dL   HCT 42.1 36.0 - 46.0 %   MCV 94.0 78.0 - 100.0 fl   MCHC 34.1 30.0 - 36.0 g/dL   RDW 12.8 11.5 - 15.5 %  Comprehensive metabolic panel  Result Value Ref Range   Sodium 141 135 - 145 mEq/L   Potassium 4.0 3.5 - 5.1 mEq/L   Chloride 103 96 - 112 mEq/L   CO2 32 19 - 32 mEq/L   Glucose,  Bld 97 70 - 99 mg/dL   BUN 13 6 - 23 mg/dL   Creatinine, Ser 0.85 0.40 - 1.20 mg/dL   Total Bilirubin 0.5 0.2 - 1.2 mg/dL   Alkaline Phosphatase 59 39 - 117 U/L   AST 14 0 - 37 U/L   ALT 12 0 - 35 U/L   Total Protein 6.9 6.0 - 8.3 g/dL   Albumin 4.7 3.5 - 5.2 g/dL   Calcium 9.7 8.4 - 10.5 mg/dL   GFR 73.32 >60.00 mL/min  Hemoglobin A1c  Result Value Ref Range   Hgb A1c MFr Bld 5.8 4.6 - 6.5 %  Lipid panel  Result Value Ref Range   Cholesterol 178 0 - 200 mg/dL   Triglycerides 101.0 0.0 - 149.0 mg/dL   HDL 56.40 >39.00 mg/dL   VLDL 20.2 0.0 - 40.0 mg/dL   LDL Cholesterol 102 (H) 0 - 99 mg/dL   Total CHOL/HDL Ratio 3    NonHDL 121.74   Vitamin D (25 hydroxy)  Result Value Ref Range   VITD 42.20 30.00 - 100.00 ng/mL   Message to pt-  Blood count is normal Metabolic profile normal Hemoglobin a1c (average blood sugar) is just barely in the pre-diabetes range, stable from last year Cholesterol is very good Vitamin D is fine Overall labs look great!   Please let me know how the trazodone works out for you

## 2018-01-13 ENCOUNTER — Encounter: Payer: Self-pay | Admitting: Family Medicine

## 2018-01-13 ENCOUNTER — Ambulatory Visit (INDEPENDENT_AMBULATORY_CARE_PROVIDER_SITE_OTHER): Payer: 59 | Admitting: Family Medicine

## 2018-01-13 VITALS — BP 112/78 | HR 85 | Temp 98.2°F | Ht 65.75 in | Wt 157.0 lb

## 2018-01-13 DIAGNOSIS — Z13220 Encounter for screening for lipoid disorders: Secondary | ICD-10-CM

## 2018-01-13 DIAGNOSIS — E559 Vitamin D deficiency, unspecified: Secondary | ICD-10-CM | POA: Diagnosis not present

## 2018-01-13 DIAGNOSIS — F5101 Primary insomnia: Secondary | ICD-10-CM | POA: Diagnosis not present

## 2018-01-13 DIAGNOSIS — Z131 Encounter for screening for diabetes mellitus: Secondary | ICD-10-CM

## 2018-01-13 DIAGNOSIS — M3500 Sicca syndrome, unspecified: Secondary | ICD-10-CM | POA: Diagnosis not present

## 2018-01-13 DIAGNOSIS — Z0000 Encounter for general adult medical examination without abnormal findings: Secondary | ICD-10-CM

## 2018-01-13 LAB — LIPID PANEL
CHOL/HDL RATIO: 3
Cholesterol: 178 mg/dL (ref 0–200)
HDL: 56.4 mg/dL (ref >39.00)
LDL CALC: 102 mg/dL — AB (ref 0–99)
NonHDL: 121.74
TRIGLYCERIDES: 101 mg/dL (ref 0.0–149.0)
VLDL: 20.2 mg/dL (ref 0.0–40.0)

## 2018-01-13 LAB — VITAMIN D 25 HYDROXY (VIT D DEFICIENCY, FRACTURES): VITD: 42.2 ng/mL (ref 30.00–100.00)

## 2018-01-13 LAB — COMPREHENSIVE METABOLIC PANEL
ALBUMIN: 4.7 g/dL (ref 3.5–5.2)
ALK PHOS: 59 U/L (ref 39–117)
ALT: 12 U/L (ref 0–35)
AST: 14 U/L (ref 0–37)
BUN: 13 mg/dL (ref 6–23)
CHLORIDE: 103 meq/L (ref 96–112)
CO2: 32 mEq/L (ref 19–32)
Calcium: 9.7 mg/dL (ref 8.4–10.5)
Creatinine, Ser: 0.85 mg/dL (ref 0.40–1.20)
GFR: 73.32 mL/min (ref >60.00)
Glucose, Bld: 97 mg/dL (ref 70–99)
POTASSIUM: 4 meq/L (ref 3.5–5.1)
Sodium: 141 mEq/L (ref 135–145)
TOTAL PROTEIN: 6.9 g/dL (ref 6.0–8.3)
Total Bilirubin: 0.5 mg/dL (ref 0.2–1.2)

## 2018-01-13 LAB — HEMOGLOBIN A1C: Hgb A1c MFr Bld: 5.8 % (ref 4.6–6.5)

## 2018-01-13 LAB — CBC
HEMATOCRIT: 42.1 % (ref 36.0–46.0)
HEMOGLOBIN: 14.4 g/dL (ref 12.0–15.0)
MCHC: 34.1 g/dL (ref 30.0–36.0)
MCV: 94 fl (ref 78.0–100.0)
Platelets: 267 10*3/uL (ref 150.0–400.0)
RBC: 4.48 Mil/uL (ref 3.87–5.11)
RDW: 12.8 % (ref 11.5–15.5)
WBC: 5 10*3/uL (ref 4.0–10.5)

## 2018-01-13 MED ORDER — TRAZODONE HCL 50 MG PO TABS: 25.0000 mg | ORAL_TABLET | Freq: Every evening | ORAL | 3 refills | Status: DC | PRN

## 2018-01-13 NOTE — Patient Instructions (Addendum)
Great to see you today!  I will be in touch with your labs asap Do be sure to get a flu shot this fall Best of luck with your exercise plans! For insomnia, please try trazodone- 1/2 tablet at bedtime.  You can increase to a whole tablet if needed.  Please let me know how this works for you. If your mood does not improve with better rest please alert me    Health Maintenance for Postmenopausal Women Menopause is a normal process in which your reproductive ability comes to an end. This process happens gradually over a span of months to years, usually between the ages of 40 and 26. Menopause is complete when you have missed 12 consecutive menstrual periods. It is important to talk with your health care provider about some of the most common conditions that affect postmenopausal women, such as heart disease, cancer, and bone loss (osteoporosis). Adopting a healthy lifestyle and getting preventive care can help to promote your health and wellness. Those actions can also lower your chances of developing some of these common conditions. What should I know about menopause? During menopause, you may experience a number of symptoms, such as:  Moderate-to-severe hot flashes.  Night sweats.  Decrease in sex drive.  Mood swings.  Headaches.  Tiredness.  Irritability.  Memory problems.  Insomnia.  Choosing to treat or not to treat menopausal changes is an individual decision that you make with your health care provider. What should I know about hormone replacement therapy and supplements? Hormone therapy products are effective for treating symptoms that are associated with menopause, such as hot flashes and night sweats. Hormone replacement carries certain risks, especially as you become older. If you are thinking about using estrogen or estrogen with progestin treatments, discuss the benefits and risks with your health care provider. What should I know about heart disease and stroke? Heart  disease, heart attack, and stroke become more likely as you age. This may be due, in part, to the hormonal changes that your body experiences during menopause. These can affect how your body processes dietary fats, triglycerides, and cholesterol. Heart attack and stroke are both medical emergencies. There are many things that you can do to help prevent heart disease and stroke:  Have your blood pressure checked at least every 1-2 years. High blood pressure causes heart disease and increases the risk of stroke.  If you are 44-108 years old, ask your health care provider if you should take aspirin to prevent a heart attack or a stroke.  Do not use any tobacco products, including cigarettes, chewing tobacco, or electronic cigarettes. If you need help quitting, ask your health care provider.  It is important to eat a healthy diet and maintain a healthy weight. ? Be sure to include plenty of vegetables, fruits, low-fat dairy products, and lean protein. ? Avoid eating foods that are high in solid fats, added sugars, or salt (sodium).  Get regular exercise. This is one of the most important things that you can do for your health. ? Try to exercise for at least 150 minutes each week. The type of exercise that you do should increase your heart rate and make you sweat. This is known as moderate-intensity exercise. ? Try to do strengthening exercises at least twice each week. Do these in addition to the moderate-intensity exercise.  Know your numbers.Ask your health care provider to check your cholesterol and your blood glucose. Continue to have your blood tested as directed by your health care  provider.  What should I know about cancer screening? There are several types of cancer. Take the following steps to reduce your risk and to catch any cancer development as early as possible. Breast Cancer  Practice breast self-awareness. ? This means understanding how your breasts normally appear and feel. ? It  also means doing regular breast self-exams. Let your health care provider know about any changes, no matter how small.  If you are 75 or older, have a clinician do a breast exam (clinical breast exam or CBE) every year. Depending on your age, family history, and medical history, it may be recommended that you also have a yearly breast X-ray (mammogram).  If you have a family history of breast cancer, talk with your health care provider about genetic screening.  If you are at high risk for breast cancer, talk with your health care provider about having an MRI and a mammogram every year.  Breast cancer (BRCA) gene test is recommended for women who have family members with BRCA-related cancers. Results of the assessment will determine the need for genetic counseling and BRCA1 and for BRCA2 testing. BRCA-related cancers include these types: ? Breast. This occurs in males or females. ? Ovarian. ? Tubal. This may also be called fallopian tube cancer. ? Cancer of the abdominal or pelvic lining (peritoneal cancer). ? Prostate. ? Pancreatic.  Cervical, Uterine, and Ovarian Cancer Your health care provider may recommend that you be screened regularly for cancer of the pelvic organs. These include your ovaries, uterus, and vagina. This screening involves a pelvic exam, which includes checking for microscopic changes to the surface of your cervix (Pap test).  For women ages 21-65, health care providers may recommend a pelvic exam and a Pap test every three years. For women ages 56-65, they may recommend the Pap test and pelvic exam, combined with testing for human papilloma virus (HPV), every five years. Some types of HPV increase your risk of cervical cancer. Testing for HPV may also be done on women of any age who have unclear Pap test results.  Other health care providers may not recommend any screening for nonpregnant women who are considered low risk for pelvic cancer and have no symptoms. Ask your  health care provider if a screening pelvic exam is right for you.  If you have had past treatment for cervical cancer or a condition that could lead to cancer, you need Pap tests and screening for cancer for at least 20 years after your treatment. If Pap tests have been discontinued for you, your risk factors (such as having a new sexual partner) need to be reassessed to determine if you should start having screenings again. Some women have medical problems that increase the chance of getting cervical cancer. In these cases, your health care provider may recommend that you have screening and Pap tests more often.  If you have a family history of uterine cancer or ovarian cancer, talk with your health care provider about genetic screening.  If you have vaginal bleeding after reaching menopause, tell your health care provider.  There are currently no reliable tests available to screen for ovarian cancer.  Lung Cancer Lung cancer screening is recommended for adults 103-2 years old who are at high risk for lung cancer because of a history of smoking. A yearly low-dose CT scan of the lungs is recommended if you:  Currently smoke.  Have a history of at least 30 pack-years of smoking and you currently smoke or have quit within  the past 15 years. A pack-year is smoking an average of one pack of cigarettes per day for one year.  Yearly screening should:  Continue until it has been 15 years since you quit.  Stop if you develop a health problem that would prevent you from having lung cancer treatment.  Colorectal Cancer  This type of cancer can be detected and can often be prevented.  Routine colorectal cancer screening usually begins at age 72 and continues through age 36.  If you have risk factors for colon cancer, your health care provider may recommend that you be screened at an earlier age.  If you have a family history of colorectal cancer, talk with your health care provider about genetic  screening.  Your health care provider may also recommend using home test kits to check for hidden blood in your stool.  A small camera at the end of a tube can be used to examine your colon directly (sigmoidoscopy or colonoscopy). This is done to check for the earliest forms of colorectal cancer.  Direct examination of the colon should be repeated every 5-10 years until age 26. However, if early forms of precancerous polyps or small growths are found or if you have a family history or genetic risk for colorectal cancer, you may need to be screened more often.  Skin Cancer  Check your skin from head to toe regularly.  Monitor any moles. Be sure to tell your health care provider: ? About any new moles or changes in moles, especially if there is a change in a mole's shape or color. ? If you have a mole that is larger than the size of a pencil eraser.  If any of your family members has a history of skin cancer, especially at a young age, talk with your health care provider about genetic screening.  Always use sunscreen. Apply sunscreen liberally and repeatedly throughout the day.  Whenever you are outside, protect yourself by wearing long sleeves, pants, a wide-brimmed hat, and sunglasses.  What should I know about osteoporosis? Osteoporosis is a condition in which bone destruction happens more quickly than new bone creation. After menopause, you may be at an increased risk for osteoporosis. To help prevent osteoporosis or the bone fractures that can happen because of osteoporosis, the following is recommended:  If you are 38-65 years old, get at least 1,000 mg of calcium and at least 600 mg of vitamin D per day.  If you are older than age 22 but younger than age 76, get at least 1,200 mg of calcium and at least 600 mg of vitamin D per day.  If you are older than age 48, get at least 1,200 mg of calcium and at least 800 mg of vitamin D per day.  Smoking and excessive alcohol intake  increase the risk of osteoporosis. Eat foods that are rich in calcium and vitamin D, and do weight-bearing exercises several times each week as directed by your health care provider. What should I know about how menopause affects my mental health? Depression may occur at any age, but it is more common as you become older. Common symptoms of depression include:  Low or sad mood.  Changes in sleep patterns.  Changes in appetite or eating patterns.  Feeling an overall lack of motivation or enjoyment of activities that you previously enjoyed.  Frequent crying spells.  Talk with your health care provider if you think that you are experiencing depression. What should I know about immunizations? It  is important that you get and maintain your immunizations. These include:  Tetanus, diphtheria, and pertussis (Tdap) booster vaccine.  Influenza every year before the flu season begins.  Pneumonia vaccine.  Shingles vaccine.  Your health care provider may also recommend other immunizations. This information is not intended to replace advice given to you by your health care provider. Make sure you discuss any questions you have with your health care provider. Document Released: 12/11/2005 Document Revised: 05/08/2016 Document Reviewed: 07/23/2015 Elsevier Interactive Patient Education  2018 [employer].

## 2018-01-14 DIAGNOSIS — M3501 Sicca syndrome with keratoconjunctivitis: Secondary | ICD-10-CM | POA: Diagnosis not present

## 2018-01-14 DIAGNOSIS — H04123 Dry eye syndrome of bilateral lacrimal glands: Secondary | ICD-10-CM | POA: Diagnosis not present

## 2018-01-18 ENCOUNTER — Telehealth: Payer: Self-pay | Admitting: Internal Medicine

## 2018-01-18 MED ORDER — PANTOPRAZOLE SODIUM 40 MG PO TBEC: 40.0000 mg | DELAYED_RELEASE_TABLET | Freq: Every day | ORAL | 0 refills | Status: DC

## 2018-01-18 NOTE — Telephone Encounter (Signed)
Rx sent 

## 2018-03-11 DIAGNOSIS — Z01419 Encounter for gynecological examination (general) (routine) without abnormal findings: Secondary | ICD-10-CM | POA: Diagnosis not present

## 2018-03-13 ENCOUNTER — Other Ambulatory Visit: Payer: Self-pay | Admitting: Internal Medicine

## 2018-03-24 DIAGNOSIS — M064 Inflammatory polyarthropathy: Secondary | ICD-10-CM | POA: Diagnosis not present

## 2018-03-24 DIAGNOSIS — M3500 Sicca syndrome, unspecified: Secondary | ICD-10-CM | POA: Diagnosis not present

## 2018-03-24 DIAGNOSIS — M150 Primary generalized (osteo)arthritis: Secondary | ICD-10-CM | POA: Diagnosis not present

## 2018-04-06 ENCOUNTER — Encounter: Payer: Self-pay | Admitting: Internal Medicine

## 2018-04-06 ENCOUNTER — Ambulatory Visit: Payer: 59 | Admitting: Internal Medicine

## 2018-04-06 VITALS — BP 112/62 | HR 82 | Ht 65.5 in | Wt 162.2 lb

## 2018-04-06 DIAGNOSIS — R1013 Epigastric pain: Secondary | ICD-10-CM

## 2018-04-06 DIAGNOSIS — K580 Irritable bowel syndrome with diarrhea: Secondary | ICD-10-CM | POA: Diagnosis not present

## 2018-04-06 DIAGNOSIS — K219 Gastro-esophageal reflux disease without esophagitis: Secondary | ICD-10-CM | POA: Diagnosis not present

## 2018-04-06 MED ORDER — PANTOPRAZOLE SODIUM 40 MG PO TBEC: 40.0000 mg | DELAYED_RELEASE_TABLET | Freq: Every day | ORAL | 3 refills | Status: DC

## 2018-04-06 NOTE — Patient Instructions (Signed)
Please continue your pantoprazole.  Follow up with Dr Hilarie Fredrickson in 1 year.  If you are age 57 or older, your body mass index should be between 23-30. Your Body mass index is 26.58 kg/m. If this is out of the aforementioned range listed, please consider follow up with your Primary Care Provider.  If you are age 84 or younger, your body mass index should be between 19-25. Your Body mass index is 26.58 kg/m. If this is out of the aformentioned range listed, please consider follow up with your Primary Care Provider.

## 2018-04-06 NOTE — Progress Notes (Signed)
   Subjective:    Patient ID: Katrina Ford, female    DOB: 1961/01/21, 57 y.o.   MRN: [id number]  HPI Katrina Ford is a 58 year old female with a history of GERD, IBS who is here for follow-up.  She also has a history of Sjogren's disease on methotrexate and Plaquenil, kidney stones and fibromyalgia.  She is here alone today and was last seen on 04/01/2017.  She reports that she has been doing well from a reflux and epigastric discomfort perspective.  She has continude pantoprazole 40 mg once daily.  She is made dietary change including following a ketogenic diet which has resulted in weight loss but also improvement in her epigastric pain.  She decreased and eliminated carbonated sodas and this also improved her epigastric discomfort.  She is not having epigastric pain now nor heartburn, dysphagia or odynophagia.  Bowel habits have been mostly regular though she will still have bouts of loose stools which can last for several days.  When loose stools are predominant it is primarily postprandial without blood or melena.  She is currently in a more normal and regular phase with her bowel movements.  Previously she was treated with rifaximin with resolution of loose stools.  She continues on methotrexate Celebrex Plaquenil and folate on the direction of Dr. Lenna Gilford for her Sjogren's syndrome.   Review of Systems As per HPI, otherwise negative  Current Medications, Allergies, Past Medical History, Past Surgical History, Family History and Social History were reviewed in [employer] record.     Objective:   Physical Exam BP 112/62   Pulse 82   Ht 5' 5.5" (1.664 m)   Wt 162 lb 3.2 oz (73.6 kg)   SpO2 96%   BMI 26.58 kg/m  Constitutional: Well-developed and well-nourished. No distress. HEENT: Normocephalic and atraumatic.   No scleral icterus. Neck: Neck supple. Trachea midline. Cardiovascular: Normal rate, regular rhythm and intact distal pulses.  Pulmonary/chest:  Effort normal and breath sounds normal. No wheezing, rales or rhonchi. Abdominal: Soft, nontender, nondistended. Bowel sounds active throughout. There are no masses palpable. No hepatosplenomegaly. Extremities: no clubbing, cyanosis, or edema Neurological: Alert and oriented to person place and time. Skin: Skin is warm and dry. Psychiatric: Normal mood and affect. Behavior is normal.      Assessment & Plan:   57 year old female with a history of GERD, IBS who is here for follow-up.   1.  GERD with epigastric pain/dyspepsia --improved with dietary modification, avoiding carbonated sodas and also continue pantoprazole 40 mg daily.  We will continue this therapy for now.  2.  IBS D --intermittently an issue though not currently.  I let her know that we could retreat with rifaximin if this becomes more problematic/frequent.  She will let me know if this is the case.  3.  CRC screening normal colonoscopy in 2012, repeat in 2022 for screening.  Annual follow-up, sooner if needed 15 minutes spent with the patient today. Greater than 50% was spent in counseling and coordination of care with the patient

## 2018-04-08 ENCOUNTER — Encounter: Payer: Self-pay | Admitting: Internal Medicine

## 2018-05-10 ENCOUNTER — Encounter: Payer: Self-pay | Admitting: Family Medicine

## 2018-05-10 MED ORDER — CYCLOBENZAPRINE HCL 10 MG PO TABS: ORAL_TABLET | ORAL | 0 refills | Status: DC

## 2018-06-14 ENCOUNTER — Encounter: Payer: Self-pay | Admitting: Family Medicine

## 2018-06-22 DIAGNOSIS — M3500 Sicca syndrome, unspecified: Secondary | ICD-10-CM | POA: Diagnosis not present

## 2018-06-22 DIAGNOSIS — M2550 Pain in unspecified joint: Secondary | ICD-10-CM | POA: Diagnosis not present

## 2018-06-22 DIAGNOSIS — M064 Inflammatory polyarthropathy: Secondary | ICD-10-CM | POA: Diagnosis not present

## 2018-06-23 ENCOUNTER — Ambulatory Visit (INDEPENDENT_AMBULATORY_CARE_PROVIDER_SITE_OTHER): Payer: 59 | Admitting: Medical

## 2018-06-23 ENCOUNTER — Encounter: Payer: Self-pay | Admitting: Medical

## 2018-06-23 VITALS — BP 125/61 | HR 70 | Temp 98.0°F | Resp 16 | Ht 65.75 in | Wt 163.4 lb

## 2018-06-23 DIAGNOSIS — R05 Cough: Secondary | ICD-10-CM

## 2018-06-23 DIAGNOSIS — R059 Cough, unspecified: Secondary | ICD-10-CM

## 2018-06-23 DIAGNOSIS — J309 Allergic rhinitis, unspecified: Secondary | ICD-10-CM

## 2018-06-23 DIAGNOSIS — J0120 Acute ethmoidal sinusitis, unspecified: Secondary | ICD-10-CM

## 2018-06-23 MED ORDER — BENZONATATE 100 MG PO CAPS: 100.0000 mg | ORAL_CAPSULE | Freq: Three times a day (TID) | ORAL | 0 refills | Status: DC | PRN

## 2018-06-23 MED ORDER — AMOXICILLIN-POT CLAVULANATE 875-125 MG PO TABS: 1.0000 | ORAL_TABLET | Freq: Two times a day (BID) | ORAL | 0 refills | Status: DC

## 2018-06-23 MED ORDER — AZELASTINE HCL 0.1 % NA SOLN: 2.0000 | Freq: Two times a day (BID) | NASAL | 11 refills | Status: DC

## 2018-06-23 MED ORDER — LEVOCETIRIZINE DIHYDROCHLORIDE 5 MG PO TABS: 5.0000 mg | ORAL_TABLET | Freq: Every evening | ORAL | 3 refills | Status: DC

## 2018-06-23 NOTE — Patient Instructions (Addendum)
Your appear to have a sinus infection(with recent probable allergic component). I am prescribing augmentin antibiotic for the infection. To help with the nasal congestion I prescribed nasal spray astelin. For your associated cough, I prescribed cough medicine benzonatate.  In addition making xyzal antihistamine available.  Rest, hydrate, tylenol for fever.  Follow up in 7 days or as needed.

## 2018-06-23 NOTE — Progress Notes (Signed)
Subjective:    Patient ID: Katrina Ford, female    DOB: 04-May-1961, 57 y.o.   MRN: [id number]  HPI  Pt in with some nasal congestion, ethmoid sinus area pain and some maxillary sinus pain.   Pt states bilateral ear pain.  When she coughs has left side maxillary sinus pain with left ear pain.  When she coughs feels mucus drain on back of throat.  No fever, no chills or sweats. But she does feel fatigued.  States for 2 weeks had nagging occasional cough. Then last week symptoms got worse.   Last 2 nights ears have been hurting.  Pt is on methotrexate.  Review of Systems  Constitutional: Positive for fatigue. Negative for chills and fever.  HENT: Positive for congestion, ear pain and sinus pressure. Negative for sore throat.   Eyes: Negative for discharge and itching.  Respiratory: Negative for cough, choking and chest tightness.   Cardiovascular: Negative for chest pain and palpitations.  Gastrointestinal: Negative for abdominal pain.  Genitourinary: Negative for dysuria.  Musculoskeletal: Negative for back pain and myalgias.  Neurological: Negative for dizziness, speech difficulty, weakness and light-headedness.       Occasional with other symptoms mild dizzy.  Hematological: Negative for adenopathy. Does not bruise/bleed easily.  Psychiatric/Behavioral: Negative for behavioral problems and confusion.    Past Medical History:  Diagnosis Date  . Chronic headache   . Depression   . Elevated hemoglobin A1c    pre diabetic  . Family history of malignant neoplasm of gastrointestinal tract   . Frozen shoulder syndrome   . Gastritis   . GERD (gastroesophageal reflux disease)   . Hiatal hernia   . IBS (irritable bowel syndrome)   . Kidney stones   . Sciatic leg pain    May 2018  . Sjogren's disease ([place])      Social History   Socioeconomic History  . Marital status: Married    Spouse name: Not on file  . Number of children: 2  . Years of education: Not on file    . Highest education level: Not on file  Occupational History  . Not on file  Social Needs  . Financial resource strain: Not on file  . Food insecurity:    Worry: Not on file    Inability: Not on file  . Transportation needs:    Medical: Not on file    Non-medical: Not on file  Tobacco Use  . Smoking status: Never Smoker  . Smokeless tobacco: Never Used  Substance and Sexual Activity  . Alcohol use: No  . Drug use: No  . Sexual activity: Yes    Birth control/protection: None  Lifestyle  . Physical activity:    Days per week: Not on file    Minutes per session: Not on file  . Stress: Not on file  Relationships  . Social connections:    Talks on phone: Not on file    Gets together: Not on file    Attends religious service: Not on file    Active member of club or organization: Not on file    Attends meetings of clubs or organizations: Not on file    Relationship status: Not on file  . Intimate partner violence:    Fear of current or ex partner: Not on file    Emotionally abused: Not on file    Physically abused: Not on file    Forced sexual activity: Not on file  Other Topics Concern  . Not on  file  Social History Narrative  . Not on file    Past Surgical History:  Procedure Laterality Date  . CHOLECYSTECTOMY  2000  . ESOPHAGEAL MANOMETRY  11/28/2012   Procedure: ESOPHAGEAL MANOMETRY (EM);  Surgeon: Sable Feil, MD;  Location: [facility];  Service: Endoscopy;  Laterality: N/A;  . FOOT SURGERY Bilateral 03/2013  . LITHOTRIPSY  [id number]  . TONSILLECTOMY      Family History  Problem Relation Age of Onset  . Diabetes Mother   . Heart disease Mother   . Diverticulitis Mother   . Hyperlipidemia Mother   . Hyperlipidemia Father   . Cancer Father        skin cancer  . Fibromyalgia Cousin   . Fibromyalgia Cousin   . Fibromyalgia Cousin   . Lupus Cousin   . Lupus Cousin   . Colon cancer Maternal Uncle 54       Passed away in February 08, 2009  . Heart disease  Paternal Grandmother        Siblings also  . Hyperlipidemia Brother   . Hyperlipidemia Sister   . Breast cancer Paternal Aunt     No Known Allergies  Current Outpatient Medications on File Prior to Visit  Medication Sig Dispense Refill  . celecoxib (CELEBREX) 200 MG capsule Take 1 capsule by mouth daily.    . Cholecalciferol (VITAMIN D3) 3000 units TABS Take by mouth.    . cyclobenzaprine (FLEXERIL) 10 MG tablet Take 1/2 or 1 tablet twice daily as needed for muscle spasm 20 tablet 0  . Dextromethorphan-Guaifenesin (MUCINEX DM MAXIMUM STRENGTH PO) Take by mouth as needed.    Marland Kitchen estrogens, conjugated, (PREMARIN) 0.625 MG tablet Take 0.625 mg by mouth daily. Twice a week    . folic acid (FOLVITE) 1 MG tablet TK 1 T PO QD  6  . hydroxychloroquine (PLAQUENIL) 200 MG tablet 400 mg daily.     . methotrexate (RHEUMATREX) 2.5 MG tablet TK 6 TS PO 1 TIME A WK  6  . pantoprazole (PROTONIX) 40 MG tablet Take 1 tablet (40 mg total) by mouth daily. 90 tablet 3  . traZODone (DESYREL) 50 MG tablet Take 0.5-1 tablets (25-50 mg total) by mouth at bedtime as needed for sleep. 30 tablet 3   No current facility-administered medications on file prior to visit.     BP 125/61   Pulse 70   Temp 98 F (36.7 C) (Oral)   Resp 16   Ht 5' 5.75" (1.67 m)   Wt 163 lb 6.4 oz (74.1 kg)   SpO2 100%   BMI 26.57 kg/m       Objective:   Physical Exam   General  Mental Status - Alert. General Appearance - Well groomed. Not in acute distress.  Skin Rashes- No Rashes.  HEENT Head- Normal. Ear Auditory Canal - Left- Normal. Right - Normal.Tympanic Membrane- Left- Normal. Right- Normal. Eye Sclera/Conjunctiva- Left- Normal. Right- Normal. Nose & Sinuses Nasal Mucosa- Left-  Boggy and Congested. Right-  Boggy and  Congested.Bilateral maxillary and ethmoid tenderness but no frontal sinus pressure. Mouth & Throat Lips: Upper Lip- Normal: no dryness, cracking, pallor, cyanosis, or vesicular eruption. Lower  Lip-Normal: no dryness, cracking, pallor, cyanosis or vesicular eruption. Buccal Mucosa- Bilateral- No Aphthous ulcers. Oropharynx- No Discharge or Erythema. Tonsils: Characteristics- Bilateral- No Erythema or Congestion. Size/Enlargement- Bilateral- No enlargement. Discharge- bilateral-None.  Neck Neck- Supple. No Masses.   Chest and Lung Exam Auscultation: Breath Sounds:-Clear even and unlabored.  Cardiovascular Auscultation:Rythm- Regular, rate and  rhythm. Murmurs & Other Heart Sounds:Ausculatation of the heart reveal- No Murmurs.  Lymphatic Head & Neck General Head & Neck Lymphatics: Bilateral: Description- rt submandibular node tender.     Assessment & Plan:  Your appear to have a sinus infection(with recent probable allergic component). I am prescribing augmentin antibiotic for the infection. To help with the nasal congestion I prescribed nasal spray astelin. For your associated cough, I prescribed cough medicine benzonatate.  In addition making xyzal antihistamine available.  Rest, hydrate, tylenol for fever.  Follow up in 7 days or as needed.  Asked her to call rheumatologist and get advise on holding upcoming methotrexate injection.  Mackie Pai, PA-C

## 2018-06-27 ENCOUNTER — Encounter: Payer: Self-pay | Admitting: Family Medicine

## 2018-06-27 DIAGNOSIS — M3500 Sicca syndrome, unspecified: Secondary | ICD-10-CM

## 2018-06-27 HISTORY — DX: Sjogren syndrome, unspecified: M35.00

## 2018-08-02 DIAGNOSIS — N200 Calculus of kidney: Secondary | ICD-10-CM | POA: Diagnosis not present

## 2018-08-02 DIAGNOSIS — R8290 Unspecified abnormal findings in urine: Secondary | ICD-10-CM | POA: Diagnosis not present

## 2018-08-02 DIAGNOSIS — R112 Nausea with vomiting, unspecified: Secondary | ICD-10-CM | POA: Diagnosis not present

## 2018-08-04 DIAGNOSIS — D2261 Melanocytic nevi of right upper limb, including shoulder: Secondary | ICD-10-CM | POA: Diagnosis not present

## 2018-08-04 DIAGNOSIS — D225 Melanocytic nevi of trunk: Secondary | ICD-10-CM | POA: Diagnosis not present

## 2018-08-04 DIAGNOSIS — Z808 Family history of malignant neoplasm of other organs or systems: Secondary | ICD-10-CM | POA: Diagnosis not present

## 2018-08-15 DIAGNOSIS — N200 Calculus of kidney: Secondary | ICD-10-CM | POA: Diagnosis not present

## 2018-08-26 ENCOUNTER — Ambulatory Visit: Payer: 59 | Admitting: Family Medicine

## 2018-08-26 ENCOUNTER — Encounter: Payer: Self-pay | Admitting: Family Medicine

## 2018-08-26 VITALS — BP 122/72 | HR 83 | Temp 98.5°F | Ht 65.0 in | Wt 158.5 lb

## 2018-08-26 DIAGNOSIS — H029 Unspecified disorder of eyelid: Secondary | ICD-10-CM | POA: Diagnosis not present

## 2018-08-26 MED ORDER — ERYTHROMYCIN 5 MG/GM OP OINT: 1.0000 "application " | TOPICAL_OINTMENT | Freq: Every day | OPHTHALMIC | 0 refills | Status: DC

## 2018-08-26 NOTE — Patient Instructions (Signed)
OK to continue artificial tears.   Use warm compresses. Massage the area up (up and out).  Let us know if you need anything.

## 2018-08-26 NOTE — Progress Notes (Signed)
Chief Complaint  Patient presents with  . Eye Drainage    Katrina Ford is here for left eye irritation.  Duration: 4 days Chemical exposure? No  Recent URI? No  Contact lenses? No  History of allergies? Yes  Treatment to date: Artificial tears Fevers? No Vision changes? No, a little blurry from tearing  ROS:  Eyes: As noted above  Past Medical History:  Diagnosis Date  . Chronic headache   . Depression   . Elevated hemoglobin A1c    pre diabetic  . Family history of malignant neoplasm of gastrointestinal tract   . Frozen shoulder syndrome   . Gastritis   . GERD (gastroesophageal reflux disease)   . Hiatal hernia   . IBS (irritable bowel syndrome)   . Kidney stones   . Sciatic leg pain    May 2018  . Sjogren's disease ([place])   . Sjogren's syndrome ([place]) 06/27/2018   With inflammatory arthritis. [facility]umatology Dr. Trudie Reed      BP 122/72 (BP Location: Left Arm, Patient Position: Sitting, Cuff Size: Normal)   Pulse 83   Temp 98.5 F (36.9 C) (Oral)   Ht 5\' 5"  (1.651 m)   Wt 158 lb 8 oz (71.9 kg)   SpO2 97%   BMI 26.38 kg/m  Gen: Awake, alert, appears stated age Eyes: Lids- see below, Sclera white, PERRLA, EOMi Lungs: No accessory muscle use Psych: Age appropriate judgment and insight; mood and affect normal        Eyelid lesion - Plan: erythromycin ophthalmic ointment  There appears to be a hordeolum next to a portion of the lid that is inflamed.  Cont Art tears.  Warm compresses massaging upwards. Call eye dr Molli Knock if no better. Pt voiced understanding and agreement to the plan.  [place], DO 08/26/18 4:07 PM

## 2018-08-26 NOTE — Progress Notes (Signed)
Pre visit review using our clinic review tool, if applicable. No additional management support is needed unless otherwise documented below in the visit note. 

## 2018-09-02 DIAGNOSIS — L98499 Non-pressure chronic ulcer of skin of other sites with unspecified severity: Secondary | ICD-10-CM | POA: Diagnosis not present

## 2018-09-02 DIAGNOSIS — H029 Unspecified disorder of eyelid: Secondary | ICD-10-CM | POA: Diagnosis not present

## 2018-09-09 DIAGNOSIS — L980 Pyogenic granuloma: Secondary | ICD-10-CM | POA: Diagnosis not present

## 2018-09-23 DIAGNOSIS — N200 Calculus of kidney: Secondary | ICD-10-CM | POA: Diagnosis not present

## 2018-10-01 ENCOUNTER — Other Ambulatory Visit: Payer: Self-pay | Admitting: Family Medicine

## 2018-10-01 DIAGNOSIS — F5101 Primary insomnia: Secondary | ICD-10-CM

## 2018-10-03 DIAGNOSIS — M3500 Sicca syndrome, unspecified: Secondary | ICD-10-CM | POA: Diagnosis not present

## 2018-10-03 DIAGNOSIS — M064 Inflammatory polyarthropathy: Secondary | ICD-10-CM | POA: Diagnosis not present

## 2018-10-03 DIAGNOSIS — M150 Primary generalized (osteo)arthritis: Secondary | ICD-10-CM | POA: Diagnosis not present

## 2018-10-10 ENCOUNTER — Encounter: Payer: Self-pay | Admitting: Family Medicine

## 2018-10-10 DIAGNOSIS — M797 Fibromyalgia: Secondary | ICD-10-CM | POA: Insufficient documentation

## 2018-10-31 DIAGNOSIS — Z1231 Encounter for screening mammogram for malignant neoplasm of breast: Secondary | ICD-10-CM | POA: Diagnosis not present

## 2019-01-03 DIAGNOSIS — M150 Primary generalized (osteo)arthritis: Secondary | ICD-10-CM | POA: Diagnosis not present

## 2019-01-03 DIAGNOSIS — M064 Inflammatory polyarthropathy: Secondary | ICD-10-CM | POA: Diagnosis not present

## 2019-01-03 DIAGNOSIS — M3500 Sicca syndrome, unspecified: Secondary | ICD-10-CM | POA: Diagnosis not present

## 2019-01-14 NOTE — Progress Notes (Addendum)
[place] [facility] [address], [place], [place] [address] [phone]  Date:  01/16/2019   Name:  Katrina Ford   DOB:  [birth year]   MRN:  [id number]  PCP:  Darreld Mclean, MD    Chief Complaint: Annual Exam (sees gyn for pap)   History of Present Illness:  Katrina Ford is a 58 y.o. very pleasant female patient who presents with the following:  Here today for a CPE- I last saw her about one year ago  History of OSA, GERD, sjogren's syndrome, polyarthrlagia, pre-diabetes, depression, FBM, kidney stones Does not use CPAP due to worsening of her dry eyes  She was having kidney stones last fall, her urologist is with [facility] They encouraged her to follow calcium oxalate dietary precautions- they have her on potassium citrate BID, no stone since then   GYN care per Dr. Meyer Russel rheum/ Dr. Trudie Reed Her mother has DM   Pap: May 2019 Mammo: 12/19 Colon: 2014, 10 year follow-up  Flu: done in November  Shingrix? - will defer due to COVID Labs:  CBC, CMP done through Dr. 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 Fibromyalgia 10/10/2018  . Sjogren's syndrome ([place]) 06/27/2018  . Urine incontinence 10/03/2015  . Polyarthralgia 08/19/2015  . OSA (obstructive sleep apnea) 08/19/2015  . Physical exam 08/14/2014  . Menopausal vaginal dryness 08/14/2014  . Depression 01/05/2014  . Abdominal pain, RLQ (right lower quadrant) 10/05/2011  . Abdominal pain 10/01/2011  . Constipation, slow transit 10/01/2011  . Abnormal liver enzymes 10/01/2011  . IRRITABLE BOWEL SYNDROME 07/18/2010  . GERD 12/01/2007  . HIATAL HERNIA 12/01/2007  . HEADACHE, CHRONIC 12/01/2007  . GASTRITIS  07/27/2000    Past Medical History:  Diagnosis Date  . Chronic headache   . Depression   . Elevated hemoglobin A1c    pre diabetic  . Family history of malignant neoplasm of gastrointestinal tract   . Frozen shoulder syndrome   . Gastritis   . GERD (gastroesophageal reflux disease)   . Hiatal hernia   . IBS (irritable bowel syndrome)   . Kidney stones   . Sciatic leg pain    May 2018  . Sjogren's disease ([place])   . Sjogren's syndrome ([place]) 06/27/2018   With inflammatory arthritis. [facility] Rheumatology Dr. Trudie Reed     Past Surgical History:  Procedure Laterality Date  . CHOLECYSTECTOMY  2000  . ESOPHAGEAL MANOMETRY  11/28/2012   Procedure: ESOPHAGEAL MANOMETRY (EM);  Surgeon: Sable Feil, MD;  Location: [facility] ENDOSCOPY;  Service: Endoscopy;  Laterality: N/A;  . FOOT SURGERY Bilateral 03/2013  . LITHOTRIPSY  [id number]  . TONSILLECTOMY      Social History   Tobacco Use  . Smoking status: Never Smoker  . Smokeless tobacco: Never Used  Substance Use Topics  . Alcohol use: No  . Drug use: No    Family History  Problem Relation Age of Onset  . Diabetes Mother   . Heart disease Mother   . Diverticulitis Mother   . Hyperlipidemia Mother   . Hyperlipidemia Father   . Cancer Father        skin cancer  . Fibromyalgia Cousin   .  Fibromyalgia Cousin   . Fibromyalgia Cousin   . Lupus Cousin   . Lupus Cousin   . Colon cancer Maternal Uncle 30       Passed away in February 13, 2009  . Heart disease Paternal Grandmother        Siblings also  . Hyperlipidemia Brother   . Hyperlipidemia Sister   . Breast cancer Paternal Aunt     No Known Allergies  Medication list has been reviewed and updated.  Current Outpatient Medications on File Prior to Visit  Medication Sig Dispense Refill  . azelastine (ASTELIN) 0.1 % nasal spray Place 2 sprays into both nostrils 2 (two) times daily. Use in each nostril as directed 30 mL 11  . celecoxib (CELEBREX) 200 MG capsule Take 1 capsule by  mouth daily.    . Cholecalciferol (VITAMIN D3) 3000 units TABS Take by mouth.    . conjugated estrogens (PREMARIN) vaginal cream Place 1 Applicatorful vaginally daily.    . folic acid (FOLVITE) 1 MG tablet TK 1 T PO QD  6  . hydroxychloroquine (PLAQUENIL) 200 MG tablet 400 mg daily.     Marland Kitchen levocetirizine (XYZAL) 5 MG tablet Take 1 tablet (5 mg total) by mouth every evening. 30 tablet 3  . methotrexate (RHEUMATREX) 2.5 MG tablet TK 6 TS PO 1 TIME A WK  6  . pantoprazole (PROTONIX) 40 MG tablet Take 1 tablet (40 mg total) by mouth daily. 90 tablet 3  . potassium citrate (UROCIT-K) 10 MEQ (1080 MG) SR tablet Take 10 mEq by mouth 3 (three) times daily with meals.    . traZODone (DESYREL) 50 MG tablet TAKE 1/2 TO 1 TABLET BY  MOUTH AT BEDTIME AS NEEDED  FOR SLEEP 30 tablet 3  . UNABLE TO FIND Med Name: Rheumate Folate     No current facility-administered medications on file prior to visit.     Review of Systems:  As per HPI- otherwise negative. No CP or SOB No skin changes or concerns    Physical Examination: Vitals:   01/16/19 1258  BP: 128/80  Pulse: 68  Resp: 16  Temp: 97.8 F (36.6 C)  SpO2: 99%   Vitals:   01/16/19 1258  Weight: 159 lb (72.1 kg)  Height: 5\' 5"  (1.651 m)   Body mass index is 26.46 kg/m. Ideal Body Weight: Weight in (lb) to have BMI = 25: 149.9  GEN: WDWN, NAD, Non-toxic, A & O x 3, mild overweight, looks well  HEENT: Atraumatic, Normocephalic. Neck supple. No masses, No LAD.  Bilateral TM wnl, oropharynx normal.  PEERL,EOMI.   Ears and Nose: No external deformity. CV: RRR, No M/G/R. No JVD. No thrill. No extra heart sounds. PULM: CTA B, no wheezes, crackles, rhonchi. No retractions. No resp. distress. No accessory muscle use. ABD: S, NT, ND, +BS. No rebound. No HSM. EXTR: No c/c/e NEURO Normal gait.  PSYCH: Normally interactive. Conversant. Not depressed or anxious appearing.  Calm demeanor.    Assessment and Plan: Physical exam  Screening for  hyperlipidemia - Plan: Lipid panel  Screening for diabetes mellitus - Plan: Hemoglobin A1c  Sjogren's syndrome, with unspecified organ involvement ([place])  Medication monitoring encounter  Screening for thyroid disorder - Plan: TSH doing well Labs pending as above Plan for shingles vaccine later on Written health maint instructions given  Plan for a CPE in one year    Signed Lamar Blinks, MD  Received her labs, message to patient  Results for orders placed or performed in visit on  01/16/19  Hemoglobin A1c  Result Value Ref Range   Hgb A1c MFr Bld 5.8 4.6 - 6.5 %  Lipid panel  Result Value Ref Range   Cholesterol 218 (H) 0 - 200 mg/dL   Triglycerides 107.0 0.0 - 149.0 mg/dL   HDL 62.20 >39.00 mg/dL   VLDL 21.4 0.0 - 40.0 mg/dL   LDL Cholesterol 134 (H) 0 - 99 mg/dL   Total CHOL/HDL Ratio 4    NonHDL 155.60   TSH  Result Value Ref Range   TSH 2.11 0.35 - 4.50 uIU/mL

## 2019-01-16 ENCOUNTER — Other Ambulatory Visit: Payer: Self-pay

## 2019-01-16 ENCOUNTER — Ambulatory Visit (INDEPENDENT_AMBULATORY_CARE_PROVIDER_SITE_OTHER): Payer: 59 | Admitting: Family Medicine

## 2019-01-16 ENCOUNTER — Encounter: Payer: Self-pay | Admitting: Family Medicine

## 2019-01-16 VITALS — BP 128/80 | HR 68 | Temp 97.8°F | Resp 16 | Ht 65.0 in | Wt 159.0 lb

## 2019-01-16 DIAGNOSIS — Z1329 Encounter for screening for other suspected endocrine disorder: Secondary | ICD-10-CM

## 2019-01-16 DIAGNOSIS — Z5181 Encounter for therapeutic drug level monitoring: Secondary | ICD-10-CM

## 2019-01-16 DIAGNOSIS — Z0000 Encounter for general adult medical examination without abnormal findings: Secondary | ICD-10-CM | POA: Diagnosis not present

## 2019-01-16 DIAGNOSIS — Z131 Encounter for screening for diabetes mellitus: Secondary | ICD-10-CM

## 2019-01-16 DIAGNOSIS — M3500 Sicca syndrome, unspecified: Secondary | ICD-10-CM | POA: Diagnosis not present

## 2019-01-16 DIAGNOSIS — Z13220 Encounter for screening for lipoid disorders: Secondary | ICD-10-CM | POA: Diagnosis not present

## 2019-01-16 LAB — LIPID PANEL
Cholesterol: 218 mg/dL — ABNORMAL HIGH (ref 0–200)
HDL: 62.2 mg/dL (ref >39.00)
LDL Cholesterol: 134 mg/dL — ABNORMAL HIGH (ref 0–99)
NonHDL: 155.6
Total CHOL/HDL Ratio: 4
Triglycerides: 107 mg/dL (ref 0.0–149.0)
VLDL: 21.4 mg/dL (ref 0.0–40.0)

## 2019-01-16 LAB — HEMOGLOBIN A1C: Hgb A1c MFr Bld: 5.8 % (ref 4.6–6.5)

## 2019-01-16 LAB — TSH: TSH: 2.11 u[IU]/mL (ref 0.35–4.50)

## 2019-01-16 NOTE — Patient Instructions (Addendum)
Great to see you today- stay safe  I will be in touch with your labs asap  Health Maintenance, Female Adopting a healthy lifestyle and getting preventive care can go a long way to promote health and wellness. Talk with your health care provider about what schedule of regular examinations is right for you. This is a good chance for you to check in with your provider about disease prevention and staying healthy. In between checkups, there are plenty of things you can do on your own. Experts have done a lot of research about which lifestyle changes and preventive measures are most likely to keep you healthy. Ask your health care provider for more information. Weight and diet Eat a healthy diet  Be sure to include plenty of vegetables, fruits, low-fat dairy products, and lean protein.  Do not eat a lot of foods high in solid fats, added sugars, or salt.  Get regular exercise. This is one of the most important things you can do for your health. ? Most adults should exercise for at least 150 minutes each week. The exercise should increase your heart rate and make you sweat (moderate-intensity exercise). ? Most adults should also do strengthening exercises at least twice a week. This is in addition to the moderate-intensity exercise. Maintain a healthy weight  Body mass index (BMI) is a measurement that can be used to identify possible weight problems. It estimates body fat based on height and weight. Your health care provider can help determine your BMI and help you achieve or maintain a healthy weight.  For females 63 years of age and older: ? A BMI below 18.5 is considered underweight. ? A BMI of 18.5 to 24.9 is normal. ? A BMI of 25 to 29.9 is considered overweight. ? A BMI of 30 and above is considered obese. Watch levels of cholesterol and blood lipids  You should start having your blood tested for lipids and cholesterol at 58 years of age, then have this test every 5 years.  You may need  to have your cholesterol levels checked more often if: ? Your lipid or cholesterol levels are high. ? You are older than 58 years of age. ? You are at high risk for heart disease. Cancer screening Lung Cancer  Lung cancer screening is recommended for adults 63-34 years old who are at high risk for lung cancer because of a history of smoking.  A yearly low-dose CT scan of the lungs is recommended for people who: ? Currently smoke. ? Have quit within the past 15 years. ? Have at least a 30-pack-year history of smoking. A pack year is smoking an average of one pack of cigarettes a day for 1 year.  Yearly screening should continue until it has been 15 years since you quit.  Yearly screening should stop if you develop a health problem that would prevent you from having lung cancer treatment. Breast Cancer  Practice breast self-awareness. This means understanding how your breasts normally appear and feel.  It also means doing regular breast self-exams. Let your health care provider know about any changes, no matter how small.  If you are in your 20s or 30s, you should have a clinical breast exam (CBE) by a health care provider every 1-3 years as part of a regular health exam.  If you are 64 or older, have a CBE every year. Also consider having a breast X-ray (mammogram) every year.  If you have a family history of breast cancer, talk to  your health care provider about genetic screening.  If you are at high risk for breast cancer, talk to your health care provider about having an MRI and a mammogram every year.  Breast cancer gene (BRCA) assessment is recommended for women who have family members with BRCA-related cancers. BRCA-related cancers include: ? Breast. ? Ovarian. ? Tubal. ? Peritoneal cancers.  Results of the assessment will determine the need for genetic counseling and BRCA1 and BRCA2 testing. Cervical Cancer Your health care provider may recommend that you be screened  regularly for cancer of the pelvic organs (ovaries, uterus, and vagina). This screening involves a pelvic examination, including checking for microscopic changes to the surface of your cervix (Pap test). You may be encouraged to have this screening done every 3 years, beginning at age 21.  For women ages 30-65, health care providers may recommend pelvic exams and Pap testing every 3 years, or they may recommend the Pap and pelvic exam, combined with testing for human papilloma virus (HPV), every 5 years. Some types of HPV increase your risk of cervical cancer. Testing for HPV may also be done on women of any age with unclear Pap test results.  Other health care providers may not recommend any screening for nonpregnant women who are considered low risk for pelvic cancer and who do not have symptoms. Ask your health care provider if a screening pelvic exam is right for you.  If you have had past treatment for cervical cancer or a condition that could lead to cancer, you need Pap tests and screening for cancer for at least 20 years after your treatment. If Pap tests have been discontinued, your risk factors (such as having a new sexual partner) need to be reassessed to determine if screening should resume. Some women have medical problems that increase the chance of getting cervical cancer. In these cases, your health care provider may recommend more frequent screening and Pap tests. Colorectal Cancer  This type of cancer can be detected and often prevented.  Routine colorectal cancer screening usually begins at 58 years of age and continues through 58 years of age.  Your health care provider may recommend screening at an earlier age if you have risk factors for colon cancer.  Your health care provider may also recommend using home test kits to check for hidden blood in the stool.  A small camera at the end of a tube can be used to examine your colon directly (sigmoidoscopy or colonoscopy). This is  done to check for the earliest forms of colorectal cancer.  Routine screening usually begins at age 50.  Direct examination of the colon should be repeated every 5-10 years through 58 years of age. However, you may need to be screened more often if early forms of precancerous polyps or small growths are found. Skin Cancer  Check your skin from head to toe regularly.  Tell your health care provider about any new moles or changes in moles, especially if there is a change in a mole's shape or color.  Also tell your health care provider if you have a mole that is larger than the size of a pencil eraser.  Always use sunscreen. Apply sunscreen liberally and repeatedly throughout the day.  Protect yourself by wearing long sleeves, pants, a wide-brimmed hat, and sunglasses whenever you are outside. Heart disease, diabetes, and high blood pressure  High blood pressure causes heart disease and increases the risk of stroke. High blood pressure is more likely to develop in: ?   People who have blood pressure in the high end of the normal range (130-139/85-89 mm Hg). ? People who are overweight or obese. ? People who are African American.  If you are 18-39 years of age, have your blood pressure checked every 3-5 years. If you are 40 years of age or older, have your blood pressure checked every year. You should have your blood pressure measured twice-once when you are at a hospital or clinic, and once when you are not at a hospital or clinic. Record the average of the two measurements. To check your blood pressure when you are not at a hospital or clinic, you can use: ? An automated blood pressure machine at a pharmacy. ? A home blood pressure monitor.  If you are between 55 years and 79 years old, ask your health care provider if you should take aspirin to prevent strokes.  Have regular diabetes screenings. This involves taking a blood sample to check your fasting blood sugar level. ? If you are at a  normal weight and have a low risk for diabetes, have this test once every three years after 58 years of age. ? If you are overweight and have a high risk for diabetes, consider being tested at a younger age or more often. Preventing infection Hepatitis B  If you have a higher risk for hepatitis B, you should be screened for this virus. You are considered at high risk for hepatitis B if: ? You were born in a country where hepatitis B is common. Ask your health care provider which countries are considered high risk. ? Your parents were born in a high-risk country, and you have not been immunized against hepatitis B (hepatitis B vaccine). ? You have HIV or AIDS. ? You use needles to inject street drugs. ? You live with someone who has hepatitis B. ? You have had sex with someone who has hepatitis B. ? You get hemodialysis treatment. ? You take certain medicines for conditions, including cancer, organ transplantation, and autoimmune conditions. Hepatitis C  Blood testing is recommended for: ? Everyone born from 1945 through 1965. ? Anyone with known risk factors for hepatitis C. Sexually transmitted infections (STIs)  You should be screened for sexually transmitted infections (STIs) including gonorrhea and chlamydia if: ? You are sexually active and are younger than 58 years of age. ? You are older than 58 years of age and your health care provider tells you that you are at risk for this type of infection. ? Your sexual activity has changed since you were last screened and you are at an increased risk for chlamydia or gonorrhea. Ask your health care provider if you are at risk.  If you do not have HIV, but are at risk, it may be recommended that you take a prescription medicine daily to prevent HIV infection. This is called pre-exposure prophylaxis (PrEP). You are considered at risk if: ? You are sexually active and do not regularly use condoms or know the HIV status of your partner(s). ? You  take drugs by injection. ? You are sexually active with a partner who has HIV. Talk with your health care provider about whether you are at high risk of being infected with HIV. If you choose to begin PrEP, you should first be tested for HIV. You should then be tested every 3 months for as long as you are taking PrEP. Pregnancy  If you are premenopausal and you may become pregnant, ask your health care provider about   preconception counseling.  If you may become pregnant, take 400 to 800 micrograms (mcg) of folic acid every day.  If you want to prevent pregnancy, talk to your health care provider about birth control (contraception). Osteoporosis and menopause  Osteoporosis is a disease in which the bones lose minerals and strength with aging. This can result in serious bone fractures. Your risk for osteoporosis can be identified using a bone density scan.  If you are 65 years of age or older, or if you are at risk for osteoporosis and fractures, ask your health care provider if you should be screened.  Ask your health care provider whether you should take a calcium or vitamin D supplement to lower your risk for osteoporosis.  Menopause may have certain physical symptoms and risks.  Hormone replacement therapy may reduce some of these symptoms and risks. Talk to your health care provider about whether hormone replacement therapy is right for you. Follow these instructions at home:  Schedule regular health, dental, and eye exams.  Stay current with your immunizations.  Do not use any tobacco products including cigarettes, chewing tobacco, or electronic cigarettes.  If you are pregnant, do not drink alcohol.  If you are breastfeeding, limit how much and how often you drink alcohol.  Limit alcohol intake to no more than 1 drink per day for nonpregnant women. One drink equals 12 ounces of beer, 5 ounces of wine, or 1 ounces of hard liquor.  Do not use street drugs.  Do not share  needles.  Ask your health care provider for help if you need support or information about quitting drugs.  Tell your health care provider if you often feel depressed.  Tell your health care provider if you have ever been abused or do not feel safe at home. This information is not intended to replace advice given to you by your health care provider. Make sure you discuss any questions you have with your health care provider. Document Released: 05/04/2011 Document Revised: 03/26/2016 Document Reviewed: 07/23/2015 Elsevier Interactive Patient Education  2019 Elsevier Inc.  

## 2019-03-12 ENCOUNTER — Other Ambulatory Visit: Payer: Self-pay | Admitting: Internal Medicine

## 2019-03-28 DIAGNOSIS — M3501 Sicca syndrome with keratoconjunctivitis: Secondary | ICD-10-CM | POA: Diagnosis not present

## 2019-03-28 DIAGNOSIS — H04123 Dry eye syndrome of bilateral lacrimal glands: Secondary | ICD-10-CM | POA: Diagnosis not present

## 2019-03-28 DIAGNOSIS — H02055 Trichiasis without entropian left lower eyelid: Secondary | ICD-10-CM | POA: Diagnosis not present

## 2019-05-02 ENCOUNTER — Other Ambulatory Visit: Payer: Self-pay | Admitting: Internal Medicine

## 2019-05-15 ENCOUNTER — Encounter: Payer: Self-pay | Admitting: *Deleted

## 2019-05-16 ENCOUNTER — Encounter: Payer: Self-pay | Admitting: Internal Medicine

## 2019-05-16 ENCOUNTER — Ambulatory Visit (INDEPENDENT_AMBULATORY_CARE_PROVIDER_SITE_OTHER): Payer: 59 | Admitting: Internal Medicine

## 2019-05-16 VITALS — Ht 65.75 in | Wt 159.0 lb

## 2019-05-16 DIAGNOSIS — K219 Gastro-esophageal reflux disease without esophagitis: Secondary | ICD-10-CM

## 2019-05-16 DIAGNOSIS — R1013 Epigastric pain: Secondary | ICD-10-CM

## 2019-05-16 DIAGNOSIS — K580 Irritable bowel syndrome with diarrhea: Secondary | ICD-10-CM

## 2019-05-16 MED ORDER — PANTOPRAZOLE SODIUM 40 MG PO TBEC: 40.0000 mg | DELAYED_RELEASE_TABLET | Freq: Every day | ORAL | 3 refills | Status: DC

## 2019-05-16 MED ORDER — RIFAXIMIN 550 MG PO TABS: 550.0000 mg | ORAL_TABLET | Freq: Three times a day (TID) | ORAL | 0 refills | Status: AC

## 2019-05-16 NOTE — Progress Notes (Signed)
Subjective:    Patient ID: Katrina Ford, female    DOB: 07/11/1961, 58 y.o.   MRN: [id number]  This service was provided via telemedicine.  Doximity application with A/V communication The patient was located at home The provider was located in provider's GI office. The patient did consent to this telephone visit and is aware of possible charges through their insurance for this visit.   The persons participating in this telemedicine service were the patient and I. Time spent on call: 15 min   HPI Katrina Ford is a 58 year old female with a history of GERD, IBS with diarrhea predominance who is here for follow-up.  She also has history of Sjogren's disease, kidney stones and fibromyalgia.  She was last seen on 04/06/2018.  She reports that she has been doing well.  She is having more issues with her irritable bowel.  Diarrhea is still a bit of an issue worse after taking methotrexate.  Outside of the diarrhea she still having mushy stools which have no form and they are more frequent than she is used to.  No blood in her stool or melena.  No abdominal pain.  Pantoprazole is working well to control her heartburn though she will have some breakthrough very dependent on her diet.  [place] food always sets off her reflux.  No trouble swallowing.  She continues with Plaquenil and methotrexate under the direction of Dr. Lenna Gilford with rheumatology.  Review of Systems As per HPI, otherwise negative    Objective:   Physical Exam No physical exam, virtual visit  CBC    Component Value Date/Time   WBC 5.0 01/13/2018 0907   RBC 4.48 01/13/2018 0907   HGB 14.4 01/13/2018 0907   HCT 42.1 01/13/2018 0907   PLT 267.0 01/13/2018 0907   MCV 94.0 01/13/2018 0907   MCH 31.1 05/31/2013 2358   MCHC 34.1 01/13/2018 0907   RDW 12.8 01/13/2018 0907   LYMPHSABS 1.8 08/19/2015 0945   MONOABS 0.4 08/19/2015 0945   EOSABS 0.1 08/19/2015 0945   BASOSABS 0.0 08/19/2015 0945   CMP     Component Value  Date/Time   NA 141 01/13/2018 0907   NA 141 01/06/2017   K 4.0 01/13/2018 0907   CL 103 01/13/2018 0907   CO2 32 01/13/2018 0907   GLUCOSE 97 01/13/2018 0907   BUN 13 01/13/2018 0907   BUN 15 01/06/2017   CREATININE 0.85 01/13/2018 0907   CALCIUM 9.7 01/13/2018 0907   PROT 6.9 01/13/2018 0907   ALBUMIN 4.7 01/13/2018 0907   AST 14 01/13/2018 0907   ALT 12 01/13/2018 0907   ALKPHOS 59 01/13/2018 0907   BILITOT 0.5 01/13/2018 0907   GFRNONAA 72 (L) 11/04/2013 1420   GFRAA 84 (L) 11/04/2013 1420        Assessment & Plan:   58 year old female with a history of GERD, IBS with diarrhea predominance who is here for follow-up.   1.  GERD with history of epigastric pain/dyspepsia --symptoms currently well controlled and diet dependent.  She watches her diet fairly closely and is focusing now on a more calorie restricted diet rather than a ketogenic diet.  We will continue pantoprazole 40 mg daily --Pantoprazole 40 mg daily along with GERD diet   2.  IBS with diarrhea predominance --favorable response to Xifaxan in 2018.  More issues with IBS of late likely related to the gut micro-biome.  We will retreat --Rifaximin 550 mg 3 times daily x14 days  3.  CRC screening with  normal colonoscopy in 2012 --repeat screening in 2022  Annual follow-up, sooner if needed

## 2019-05-16 NOTE — Patient Instructions (Addendum)
Please continue pantoprazole 40 mg daily along with following GERD diet.   We have sent the following medications to your pharmacy for you to pick up at your convenience: Xifaxan 550 mg 3 times daily x14 days  You will be due for a recall colonoscopy in 2022. We will send you a reminder in the mail when it gets closer to that time.  Please follow up with Dr Hilarie Fredrickson in the office in 1 year.  If you are age 58 or older, your body mass index should be between 23-30. Your Body mass index is 25.86 kg/m. If this is out of the aforementioned range listed, please consider follow up with your Primary Care Provider.  If you are age 58 or younger, your body mass index should be between 19-25. Your Body mass index is 25.86 kg/m. If this is out of the aformentioned range listed, please consider follow up with your Primary Care Provider.

## 2019-05-16 NOTE — Addendum Note (Signed)
Addended by: Larina Bras on: 05/16/2019 02:16 PM   Modules accepted: Orders

## 2019-06-12 ENCOUNTER — Encounter: Payer: Self-pay | Admitting: Family Medicine

## 2019-06-12 ENCOUNTER — Other Ambulatory Visit: Payer: Self-pay

## 2019-06-13 ENCOUNTER — Encounter: Payer: Self-pay | Admitting: Internal Medicine

## 2019-06-13 ENCOUNTER — Ambulatory Visit: Payer: 59 | Admitting: Internal Medicine

## 2019-06-13 ENCOUNTER — Other Ambulatory Visit: Payer: Self-pay | Admitting: Family Medicine

## 2019-06-13 VITALS — BP 125/62 | HR 78 | Temp 97.3°F | Resp 16 | Ht 65.75 in | Wt 162.1 lb

## 2019-06-13 DIAGNOSIS — F5101 Primary insomnia: Secondary | ICD-10-CM

## 2019-06-13 DIAGNOSIS — M25562 Pain in left knee: Secondary | ICD-10-CM | POA: Diagnosis not present

## 2019-06-13 NOTE — Patient Instructions (Signed)
Take Celebrex as before  ICE twice a day if needed  Call if pain returns

## 2019-06-13 NOTE — Progress Notes (Signed)
Subjective:    Patient ID: Katrina Ford, female    DOB: [birth year], 58 y.o.   MRN: [id number]  DOS:  06/13/2019 Type of visit - description: Acute Symptoms started 06/09/2019 when she was awake by severe left knee pain, described as sharp, like she hit the knee somewhere. The area was TTP mostly anteriorly and she noticed some bruises. Pain increased with walking and radiates to the shin. Overall since then pain has decreased significantly and she is almost back to normal    Review of Systems No fever chills No redness No warmness Minimal swelling. No injury or recent increase in physical activity  Past Medical History:  Diagnosis Date  . Chronic headache   . Depression   . Elevated hemoglobin A1c    pre diabetic  . Family history of malignant neoplasm of gastrointestinal tract   . Frozen shoulder syndrome   . Gastritis   . GERD (gastroesophageal reflux disease)   . Hiatal hernia   . IBS (irritable bowel syndrome)   . Kidney stones   . Sciatic leg pain    May 2018  . Sjogren's disease ([place])   . Sjogren's syndrome ([place]) 06/27/2018   With inflammatory arthritis. [facility] Rheumatology Dr. Trudie Reed     Past Surgical History:  Procedure Laterality Date  . CHOLECYSTECTOMY  2000  . ESOPHAGEAL MANOMETRY  11/28/2012   Procedure: ESOPHAGEAL MANOMETRY (EM);  Surgeon: Sable Feil, MD;  Location: [facility];  Service: Endoscopy;  Laterality: N/A;  . FOOT SURGERY Bilateral 03/2013  . LITHOTRIPSY  [id number]  . TONSILLECTOMY      Social History   Socioeconomic History  . Marital status: Married    Spouse name: Not on file  . Number of children: 2  . Years of education: Not on file  . Highest education level: Not on file  Occupational History  . Not on file  Social Needs  . Financial resource strain: Not on file  . Food insecurity    Worry: Not on file    Inability: Not on file  . Transportation needs    Medical: Not on file    Non-medical: Not on file   Tobacco Use  . Smoking status: Never Smoker  . Smokeless tobacco: Never Used  Substance and Sexual Activity  . Alcohol use: No  . Drug use: No  . Sexual activity: Yes    Birth control/protection: None  Lifestyle  . Physical activity    Days per week: Not on file    Minutes per session: Not on file  . Stress: Not on file  Relationships  . Social Herbalist on phone: Not on file    Gets together: Not on file    Attends religious service: Not on file    Active member of club or organization: Not on file    Attends meetings of clubs or organizations: Not on file    Relationship status: Not on file  . Intimate partner violence    Fear of current or ex partner: Not on file    Emotionally abused: Not on file    Physically abused: Not on file    Forced sexual activity: Not on file  Other Topics Concern  . Not on file  Social History Narrative  . Not on file      Allergies as of 06/13/2019   No Known Allergies     Medication List       Accurate as of June 13, 2019 11:59  PM. If you have any questions, ask your nurse or doctor.        azelastine 0.1 % nasal spray Commonly known as: ASTELIN Place 2 sprays into both nostrils 2 (two) times daily. Use in each nostril as directed   celecoxib 200 MG capsule Commonly known as: CELEBREX Take 1 capsule by mouth daily.   folic acid 1 MG tablet Commonly known as: FOLVITE Take 2 mg by mouth daily.   hydroxychloroquine 200 MG tablet Commonly known as: PLAQUENIL 400 mg daily.   levocetirizine 5 MG tablet Commonly known as: XYZAL Take 1 tablet (5 mg total) by mouth every evening. What changed: additional instructions   methotrexate 2.5 MG tablet Commonly known as: RHEUMATREX TK 6 TS PO 1 TIME A WK   pantoprazole 40 MG tablet Commonly known as: PROTONIX Take 1 tablet (40 mg total) by mouth daily.   Potassium Citrate 15 MEQ (1620 MG) Tbcr Take 1 tablet by mouth 2 (two) times a day.   Restasis 0.05 %  ophthalmic emulsion Generic drug: cycloSPORINE Place 2 drops into both eyes daily.   traZODone 50 MG tablet Commonly known as: DESYREL TAKE 1/2 TO 1 TABLET BY  MOUTH AT BEDTIME AS NEEDED  FOR SLEEP   UNABLE TO FIND Med Name: Rheumate Folate   Vitamin D3 75 MCG (3000 UT) Tabs Take by mouth daily.           Objective:   Physical Exam BP 125/62 (BP Location: Left Arm, Patient Position: Sitting, Cuff Size: Small)   Pulse 78   Temp (!) 97.3 F (36.3 C) (Temporal)   Resp 16   Ht 5' 5.75" (1.67 m)   Wt 162 lb 2 oz (73.5 kg)   SpO2 99%   BMI 26.37 kg/m  General:   Well developed, NAD, BMI noted. HEENT:  Normocephalic . Face symmetric, atraumatic MSK: calves symmetric and not tender Knees: Symmetric, no effusion, range of motion normal. Minimal TTP at the left anterior lateral knee. Skin: Not pale. Not jaundice Neurologic:  alert & oriented X3.  Speech normal, gait appropriate for age and unassisted Psych--  Cognition and judgment appear intact.  Cooperative with normal attention span and concentration.  Behavior appropriate. No anxious or depressed appearing.      Assessment    58 year old female, PMH includes OSA, GERD, fibromyalgia, Sjogren's, presents with:  Left knee pain: As described above, improving spontaneously, no history of injury, on exam there is no effusion on the knee is a stable. Etiology unclear, recommend to continue Celebrex as before, eyes and call if symptoms resurface.  Alternatively, she could call her sports medicine doctor if these happen again.

## 2019-06-13 NOTE — Progress Notes (Signed)
Pre visit review using our clinic review tool, if applicable. No additional management support is needed unless otherwise documented below in the visit note. 

## 2019-06-14 NOTE — Telephone Encounter (Signed)
Patient is requesting year supply of medication. Please advise.

## 2019-06-14 NOTE — Telephone Encounter (Signed)
Ok done

## 2019-11-01 ENCOUNTER — Encounter: Payer: Self-pay | Admitting: Family Medicine

## 2019-11-02 ENCOUNTER — Ambulatory Visit (INDEPENDENT_AMBULATORY_CARE_PROVIDER_SITE_OTHER): Payer: 59 | Admitting: Medical

## 2019-11-02 ENCOUNTER — Other Ambulatory Visit: Payer: Self-pay

## 2019-11-02 ENCOUNTER — Encounter: Payer: Self-pay | Admitting: Medical

## 2019-11-02 ENCOUNTER — Telehealth: Payer: Self-pay | Admitting: *Deleted

## 2019-11-02 ENCOUNTER — Telehealth: Payer: Self-pay | Admitting: Family Medicine

## 2019-11-02 ENCOUNTER — Ambulatory Visit (HOSPITAL_BASED_OUTPATIENT_CLINIC_OR_DEPARTMENT_OTHER)
Admission: RE | Admit: 2019-11-02 | Discharge: 2019-11-02 | Disposition: A | Discharge status: Home / Self Care | Payer: 59 | Source: Ambulatory Visit | Attending: Medical | Admitting: Medical

## 2019-11-02 ENCOUNTER — Telehealth: Payer: Self-pay

## 2019-11-02 VITALS — BP 131/80 | HR 83 | Temp 98.4°F | Ht 66.75 in | Wt 160.0 lb

## 2019-11-02 DIAGNOSIS — M792 Neuralgia and neuritis, unspecified: Secondary | ICD-10-CM

## 2019-11-02 DIAGNOSIS — M542 Cervicalgia: Secondary | ICD-10-CM

## 2019-11-02 MED ORDER — PREDNISONE 10 MG PO TABS: ORAL_TABLET | ORAL | 0 refills | Status: DC

## 2019-11-02 MED ORDER — TRAMADOL HCL 50 MG PO TABS: ORAL_TABLET | ORAL | 0 refills | Status: DC

## 2019-11-02 NOTE — Patient Instructions (Signed)
For neck pain with radicular pain to both ext, I rx'd prednisone taper dose and tramadol. DC celebrex.  Will get xray of c-spine today.  If pain persists/not improving by follow up then recommend referral to specialist or try to order mri.  Follow up one week or as needed

## 2019-11-02 NOTE — Progress Notes (Signed)
   Subjective:    Patient ID: Katrina Ford, female    DOB: 1961-04-20, 58 y.o.   MRN: [id number]  HPI  Virtual Visit via Telephone Note  I connected with Katrina Ford on 11/02/19 at 11:20 AM EST by telephone and verified that I am speaking with the correct person using two identifiers.  Location: Patient: home Provider: office   I discussed the limitations, risks, security and privacy concerns of performing an evaluation and management service by telephone and the availability of in person appointments. I also discussed with the patient that there may be a patient responsible charge related to this service. The patient expressed understanding and agreed to proceed.   History of Present Illness:   Pt states recent neck pain with pain that is shooting toward her shoulder and down her arm. Pain mainly on left side. Some on rt side as well. Pain on left side is severe. When she turns her head symptoms worsen. Feels like some reduced range of motion of neck.   Pt has hx of pinched nerve in her neck.  Pt has tried heating pad, increase of celebrex and some hemp cream.  Pt states known degenerative changes on her neck.  States 3 years ago saw orthopedist. Xray done but no mri done by that ortho. 6-7 years ago had mri of c spine.  Tingling to left thenar imminense area.      Observations/Objective: General- no acute distress, pleasant, alert, oriented and normal speech.  Assessment and Plan: For neck pain with radicular pain to both ext, I rx'd prednisone taper dose and tramadol. DC celebrex.  Will get xray of c-spine today.  If pain persists/not improving by follow up then recommend referral to specialist or try to order mri.  Follow up one week or as needed  Mackie Pai, PA-C  Follow Up Instructions:    I discussed the assessment and treatment plan with the patient. The patient was provided an opportunity to ask questions and all were answered. The patient agreed  with the plan and demonstrated an understanding of the instructions.   The patient was advised to call back or seek an in-person evaluation if the symptoms worsen or if the condition fails to improve as anticipated.  I provided 30 minutes of non-face-to-face time during this encounter.   Mackie Pai, PA-C    Review of Systems  Constitutional: Negative for chills, fatigue and fever.  Respiratory: Negative for cough, chest tightness, shortness of breath and wheezing.   Cardiovascular: Negative for chest pain and palpitations.  Gastrointestinal: Negative for abdominal pain.  Musculoskeletal: Positive for neck pain.  Skin: Negative for rash.  Neurological: Negative for dizziness, numbness and headaches.       Radicular pain.  Hematological: Negative for adenopathy. Does not bruise/bleed easily.  Psychiatric/Behavioral: Negative for behavioral problems and confusion.       Objective:   Physical Exam        Assessment & Plan:

## 2019-11-02 NOTE — Telephone Encounter (Signed)
Patient calling with statement the pharmacy has not received the prescription from her visit today. TC to patient's pharmacy [facility], [place]. Gave verbal for Prednisone prescription sent in earlier today by provider.

## 2019-11-02 NOTE — Telephone Encounter (Signed)
Pt. Reports pharmacy states they did not receive Prednisone 10 mg prescription today. Seems to have printed. Resent through electronic system.

## 2019-11-10 ENCOUNTER — Encounter: Payer: Self-pay | Admitting: Family Medicine

## 2019-11-25 ENCOUNTER — Encounter: Payer: Self-pay | Admitting: Family Medicine

## 2020-05-11 IMAGING — DX DG CERVICAL SPINE COMPLETE 4+V
6 series · 6 of 6 positions shown · non-contrast
Comparison: None.

CLINICAL DATA: Neck pain radiating into the left upper extremity
for 1 month. No known injury.

EXAM:
CERVICAL SPINE - COMPLETE 4+ VIEW

[c-spine lat]
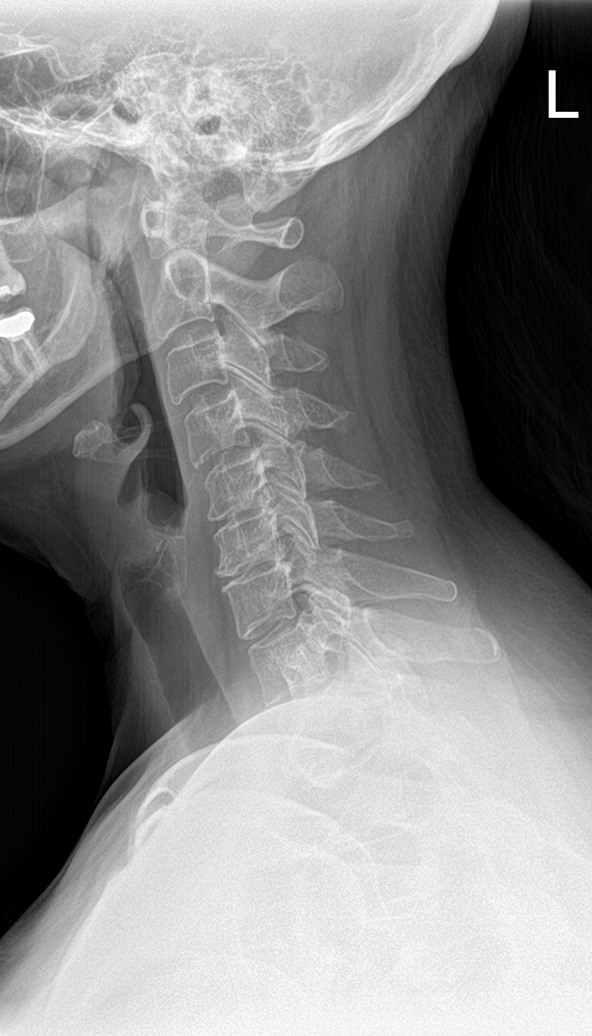

[c-spine obl (1 of 2)]
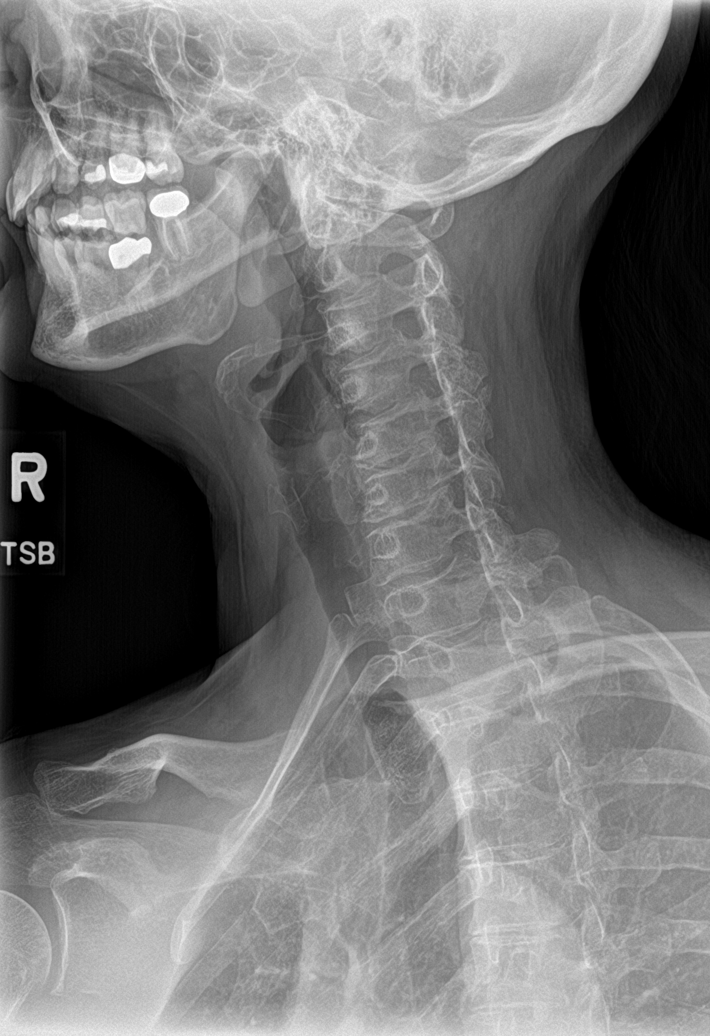

[c-spine obl (2 of 2)]
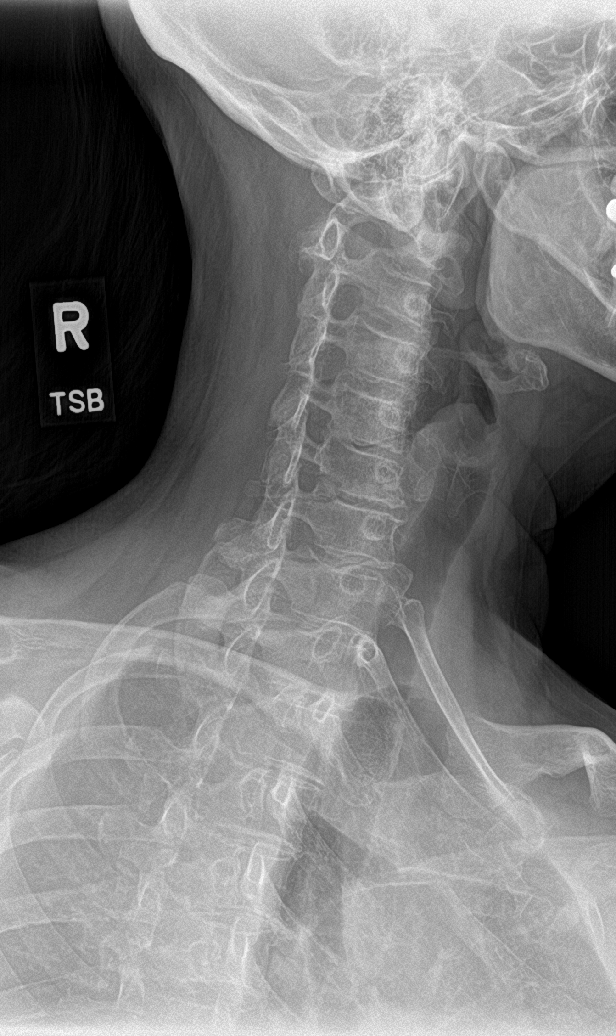

[c-spine ap]
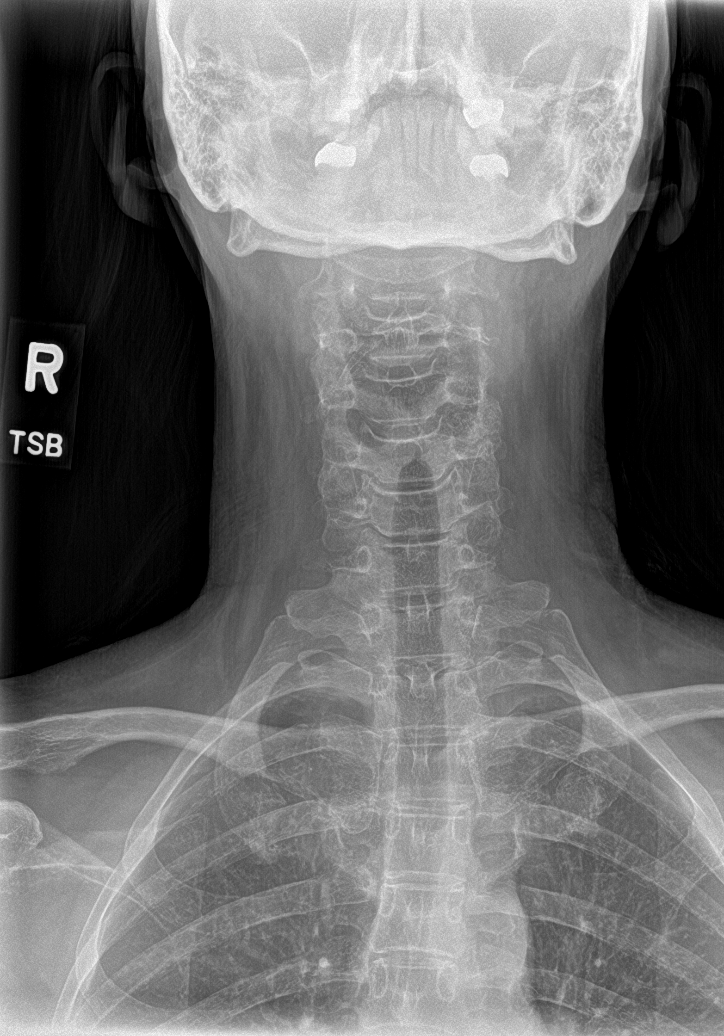

[c-spine open mouth]
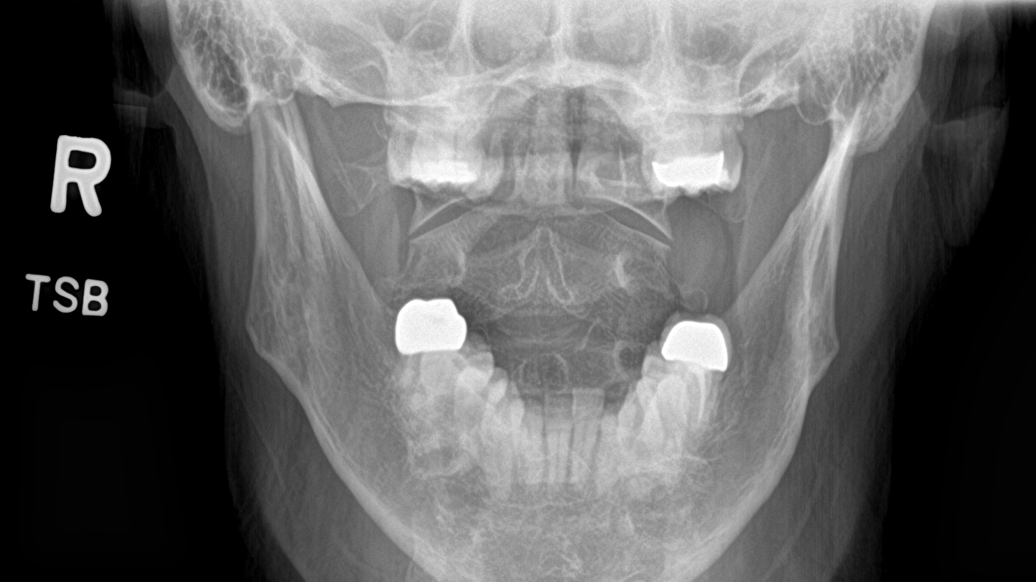

[[person_name]]
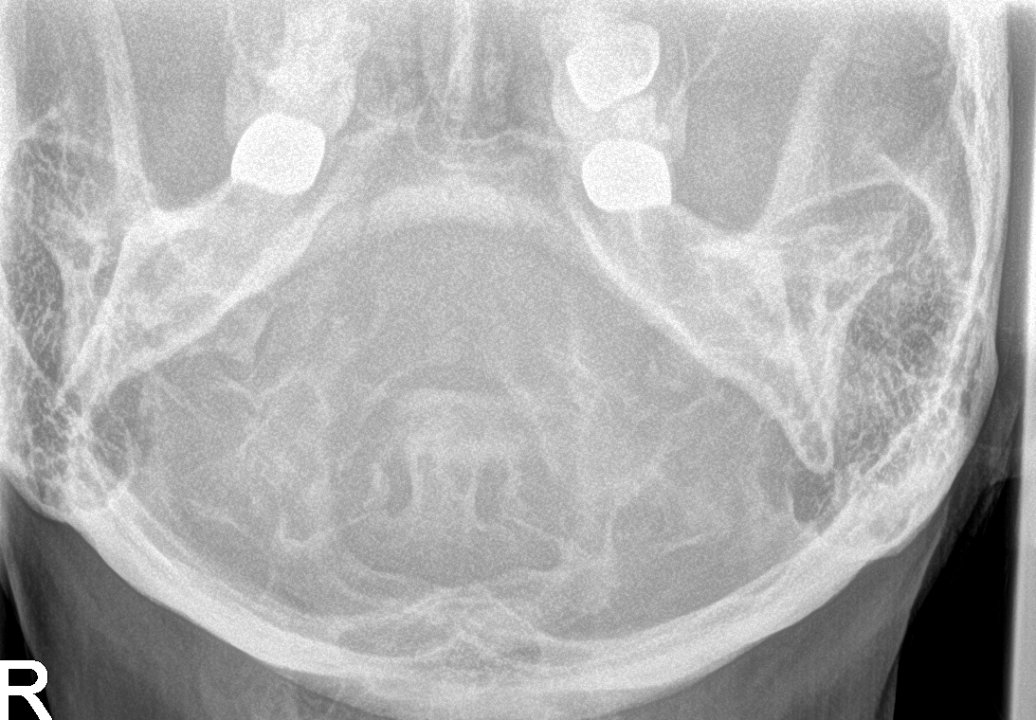

[6 of 6 positions shown; findings below may reference images not displayed]

FINDINGS: There is straightening of the normal cervical lordosis and trace
anterolisthesis C4 on C5. No fracture. Loss of disc space height is
seen at C5-6 and C6-7. Multilevel facet arthropathy is identified.
Prevertebral soft tissues appear normal. Lung apices are clear.
IMPRESSION: No acute abnormality.

Mild-to-moderate appearing degenerative disc disease C5-6 and C6-7.

Multilevel facet arthropathy.

## 2020-08-14 ENCOUNTER — Other Ambulatory Visit: Payer: Self-pay | Admitting: Internal Medicine

## 2020-09-18 ENCOUNTER — Encounter: Payer: Self-pay | Admitting: Family Medicine

## 2020-09-18 DIAGNOSIS — R7303 Prediabetes: Secondary | ICD-10-CM | POA: Insufficient documentation

## 2020-09-18 NOTE — Progress Notes (Addendum)
[place] [facility] [address], [place], [place] [address] [phone]  Date:  09/19/2020   Name:  Katrina Ford   DOB:  1960/12/20   MRN:  [id number]  PCP:  Darreld Mclean, MD    Chief Complaint: Annual Exam (flu shot)   History of Present Illness:  Katrina Ford is a 59 y.o. very pleasant female patient who presents with the following:  Here today for physical exam-last seen by myself March 2020 History of OSA, GERD, sjogren's syndrome, polyarthrlagia, pre-diabetes, depression, FBM, kidney stones Does not use CPAP due to worsening of her dry eyes  GYN care per Dr. Meyer Russel rheum/ Dr. Trudie Reed- last visit this week, and she had some labs.   Her eye sx are stable  Married to [employer].  She is working from home and this has been really good for her.  She is less stressed and is overall thriving.  She is not sure when she will have to get back to the office  Never a smoker, she is trying to get more exercise as time allows by walking.  She has lost about 10 lbs   COVID-19 series done, plans to get booster  Mammogram- this was done in January  Flu vaccine- give today  Pap is up-to-date Colon cancer screen up-to-date; due next year  Most recent labs on chart from March 2020  11/15- CBC normal, CMP normal per rheumatology.  Pt showed me labs on her phone for review  She notes that since her blood was drawn on Monday (today is Thursday) she has noted some pain and aching in her left arm.   No numbness or tingling, no weakness.  She did not notice any problem with the blood draw itself  Patient Active Problem List   Diagnosis Date Noted  . Prediabetes 09/18/2020  . Fibromyalgia 10/10/2018  . Sjogren's syndrome ([place]) 06/27/2018  . Urine incontinence 10/03/2015  . Polyarthralgia 08/19/2015  . OSA (obstructive sleep apnea) 08/19/2015  . Physical exam 08/14/2014  . Menopausal vaginal dryness 08/14/2014  .  Depression 01/05/2014  . Abdominal pain, RLQ (right lower quadrant) 10/05/2011  . Abdominal pain 10/01/2011  . Constipation, slow transit 10/01/2011  . Abnormal liver enzymes 10/01/2011  . IRRITABLE BOWEL SYNDROME 07/18/2010  . GERD 12/01/2007  . HIATAL HERNIA 12/01/2007  . HEADACHE, CHRONIC 12/01/2007  . GASTRITIS 07/27/2000    Past Medical History:  Diagnosis Date  . Chronic headache   . Depression   . Elevated hemoglobin A1c    pre diabetic  . Family history of malignant neoplasm of gastrointestinal tract   . Frozen shoulder syndrome   . Gastritis   . GERD (gastroesophageal reflux disease)   . Hiatal hernia   . IBS (irritable bowel syndrome)   . Kidney stones   . Sciatic leg pain    May 2018  . Sjogren's disease ([place])   . Sjogren's syndrome ([place]) 06/27/2018   With inflammatory arthritis. [facility] Rheumatology Dr. Trudie Reed     Past Surgical History:  Procedure Laterality Date  . CHOLECYSTECTOMY  2000  . ESOPHAGEAL MANOMETRY  11/28/2012   Procedure: ESOPHAGEAL MANOMETRY (EM);  Surgeon: Sable Feil, MD;  Location: [facility];  Service: Endoscopy;  Laterality: N/A;  . FOOT SURGERY Bilateral 03/2013  . LITHOTRIPSY  [id number]  . TONSILLECTOMY      Social History   Tobacco Use  . Smoking status: Never Smoker  . Smokeless tobacco:  Never Used  Vaping Use  . Vaping Use: Never used  Substance Use Topics  . Alcohol use: No  . Drug use: No    Family History  Problem Relation Age of Onset  . Diabetes Mother   . Heart disease Mother   . Diverticulitis Mother   . Hyperlipidemia Mother   . Hyperlipidemia Father   . Cancer Father        skin cancer  . Fibromyalgia Cousin   . Fibromyalgia Cousin   . Fibromyalgia Cousin   . Lupus Cousin   . Lupus Cousin   . Colon cancer Maternal Uncle 63       Passed away in 02-12-2009  . Heart disease Paternal Grandmother        Siblings also  . Hyperlipidemia Brother   . Hyperlipidemia Sister   . Breast cancer Paternal Aunt    . Diabetes Maternal Uncle   . Heart disease Maternal Uncle     No Known Allergies  Medication list has been reviewed and updated.  Current Outpatient Medications on File Prior to Visit  Medication Sig Dispense Refill  . Cholecalciferol (VITAMIN D3) 3000 units TABS Take by mouth daily.     . folic acid (FOLVITE) 1 MG tablet Take 2 mg by mouth daily.   6  . hydroxychloroquine (PLAQUENIL) 200 MG tablet 400 mg daily.     . methotrexate (RHEUMATREX) 2.5 MG tablet TK 6 TS PO 1 TIME A WK  6  . pantoprazole (PROTONIX) 40 MG tablet Take 1 tablet (40 mg total) by mouth daily. 90 tablet 3  . traZODone (DESYREL) 50 MG tablet TAKE 1/2 TO 1 TABLET BY  MOUTH AT BEDTIME AS NEEDED  FOR SLEEP 90 tablet 3   No current facility-administered medications on file prior to visit.    Review of Systems:  As per HPI- otherwise negative.   Physical Examination: Vitals:   09/19/20 1041  BP: 128/80  Pulse: 75  Resp: 16  SpO2: 99%   Vitals:   09/19/20 1041  Weight: 153 lb (69.4 kg)  Height: 5\' 6"  (1.676 m)   Body mass index is 24.69 kg/m. Ideal Body Weight: Weight in (lb) to have BMI = 25: 154.6  GEN: no acute distress.  Normal weight, looks well  HEENT: Atraumatic, Normocephalic.   Bilateral TM wnl, oropharynx normal.  PEERL,EOMI.   Ears and Nose: No external deformity. CV: RRR, No M/G/R. No JVD. No thrill. No extra heart sounds. PULM: CTA B, no wheezes, crackles, rhonchi. No retractions. No resp. distress. No accessory muscle use. ABD: S, NT, ND, +BS. No rebound. No HSM. EXTR: No c/c/e PSYCH: Normally interactive. Conversant.  No redness or swelling of left arm   Wt Readings from Last 3 Encounters:  09/19/20 153 lb (69.4 kg)  11/02/19 160 lb (72.6 kg)  06/13/19 162 lb 2 oz (73.5 kg)    Assessment and Plan: Physical exam  Screening for hyperlipidemia - Plan: Lipid panel  Medication monitoring encounter  Screening for thyroid disorder - Plan: TSH  Sjogren's syndrome, with  unspecified organ involvement ([place])  Prediabetes - Plan: Hemoglobin A1c  Screening for HIV (human immunodeficiency virus) - Plan: HIV Antibody (routine testing w rflx)  Fatigue, unspecified type - Plan: TSH, VITAMIN D 25 Hydroxy (Vit-D Deficiency, Fractures)  Screening for deficiency anemia  Primary insomnia - Plan: traZODone (DESYREL) 50 MG tablet  Insomnia, unspecified type  Gastroesophageal reflux disease, unspecified whether esophagitis present - Plan: pantoprazole (PROTONIX) 40 MG tablet  CPE today  meds refilled as above. Continue good work with diet and exercise Will plan further follow- up pending labs.  Discussed health maint and immunizations Offered to rx prednisone for her arm- for now she prefers to observe, will let me know if this does not go away  This visit occurred during the SARS-CoV-2 public health emergency.  Safety protocols were in place, including screening questions prior to the visit, additional usage of staff PPE, and extensive cleaning of exam room while observing appropriate contact time as indicated for disinfecting solutions.    Signed Lamar Blinks, MD   Received her labs as below, message to pt Results for orders placed or performed in visit on 09/19/20  Hemoglobin A1c  Result Value Ref Range   Hgb A1c MFr Bld 5.7 4.6 - 6.5 %  TSH  Result Value Ref Range   TSH 1.72 0.35 - 4.50 uIU/mL  VITAMIN D 25 Hydroxy (Vit-D Deficiency, Fractures)  Result Value Ref Range   VITD 54.87 30.00 - 100.00 ng/mL  Lipid panel  Result Value Ref Range   Cholesterol 169 0 - 200 mg/dL   Triglycerides 98.0 0 - 149 mg/dL   HDL 56.90 >39.00 mg/dL   VLDL 19.6 0.0 - 40.0 mg/dL   LDL Cholesterol 93 0 - 99 mg/dL   Total CHOL/HDL Ratio 3    NonHDL 112.59

## 2020-09-18 NOTE — Patient Instructions (Addendum)
It was great to see you again today, I will be in touch your labs soon as possible Flu vaccine today I do recommend a covid 19 booster for you  Please let me know if your left arm pain does not resolve in the next couple of days- Sooner if worse.     Health Maintenance, Female Adopting a healthy lifestyle and getting preventive care are important in promoting health and wellness. Ask your health care provider about:  The right schedule for you to have regular tests and exams.  Things you can do on your own to prevent diseases and keep yourself healthy. What should I know about diet, weight, and exercise? Eat a healthy diet   Eat a diet that includes plenty of vegetables, fruits, low-fat dairy products, and lean protein.  Do not eat a lot of foods that are high in solid fats, added sugars, or sodium. Maintain a healthy weight Body mass index (BMI) is used to identify weight problems. It estimates body fat based on height and weight. Your health care provider can help determine your BMI and help you achieve or maintain a healthy weight. Get regular exercise Get regular exercise. This is one of the most important things you can do for your health. Most adults should:  Exercise for at least 150 minutes each week. The exercise should increase your heart rate and make you sweat (moderate-intensity exercise).  Do strengthening exercises at least twice a week. This is in addition to the moderate-intensity exercise.  Spend less time sitting. Even light physical activity can be beneficial. Watch cholesterol and blood lipids Have your blood tested for lipids and cholesterol at 59 years of age, then have this test every 5 years. Have your cholesterol levels checked more often if:  Your lipid or cholesterol levels are high.  You are older than 59 years of age.  You are at high risk for heart disease. What should I know about cancer screening? Depending on your health history and family  history, you may need to have cancer screening at various ages. This may include screening for:  Breast cancer.  Cervical cancer.  Colorectal cancer.  Skin cancer.  Lung cancer. What should I know about heart disease, diabetes, and high blood pressure? Blood pressure and heart disease  High blood pressure causes heart disease and increases the risk of stroke. This is more likely to develop in people who have high blood pressure readings, are of African descent, or are overweight.  Have your blood pressure checked: ? Every 3-5 years if you are 53-47 years of age. ? Every year if you are 51 years old or older. Diabetes Have regular diabetes screenings. This checks your fasting blood sugar level. Have the screening done:  Once every three years after age 66 if you are at a normal weight and have a low risk for diabetes.  More often and at a younger age if you are overweight or have a high risk for diabetes. What should I know about preventing infection? Hepatitis B If you have a higher risk for hepatitis B, you should be screened for this virus. Talk with your health care provider to find out if you are at risk for hepatitis B infection. Hepatitis C Testing is recommended for:  Everyone born from [birth year] through 1965.  Anyone with known risk factors for hepatitis C. Sexually transmitted infections (STIs)  Get screened for STIs, including gonorrhea and chlamydia, if: ? You are sexually active and are younger than 59  years of age. ? You are older than 59 years of age and your health care provider tells you that you are at risk for this type of infection. ? Your sexual activity has changed since you were last screened, and you are at increased risk for chlamydia or gonorrhea. Ask your health care provider if you are at risk.  Ask your health care provider about whether you are at high risk for HIV. Your health care provider may recommend a prescription medicine to help prevent HIV  infection. If you choose to take medicine to prevent HIV, you should first get tested for HIV. You should then be tested every 3 months for as long as you are taking the medicine. Pregnancy  If you are about to stop having your period (premenopausal) and you may become pregnant, seek counseling before you get pregnant.  Take 400 to 800 micrograms (mcg) of folic acid every day if you become pregnant.  Ask for birth control (contraception) if you want to prevent pregnancy. Osteoporosis and menopause Osteoporosis is a disease in which the bones lose minerals and strength with aging. This can result in bone fractures. If you are 82 years old or older, or if you are at risk for osteoporosis and fractures, ask your health care provider if you should:  Be screened for bone loss.  Take a calcium or vitamin D supplement to lower your risk of fractures.  Be given hormone replacement therapy (HRT) to treat symptoms of menopause. Follow these instructions at home: Lifestyle  Do not use any products that contain nicotine or tobacco, such as cigarettes, e-cigarettes, and chewing tobacco. If you need help quitting, ask your health care provider.  Do not use street drugs.  Do not share needles.  Ask your health care provider for help if you need support or information about quitting drugs. Alcohol use  Do not drink alcohol if: ? Your health care provider tells you not to drink. ? You are pregnant, may be pregnant, or are planning to become pregnant.  If you drink alcohol: ? Limit how much you use to 0-1 drink a day. ? Limit intake if you are breastfeeding.  Be aware of how much alcohol is in your drink. In the [place] one drink equals one 12 oz bottle of beer (355 mL), one 5 oz glass of wine (148 mL), or one 1 oz glass of hard liquor (44 mL). General instructions  Schedule regular health, dental, and eye exams.  Stay current with your vaccines.  Tell your health care provider if: ? You  often feel depressed. ? You have ever been abused or do not feel safe at home. Summary  Adopting a healthy lifestyle and getting preventive care are important in promoting health and wellness.  Follow your health care provider's instructions about healthy diet, exercising, and getting tested or screened for diseases.  Follow your health care provider's instructions on monitoring your cholesterol and blood pressure. This information is not intended to replace advice given to you by your health care provider. Make sure you discuss any questions you have with your health care provider. Document Revised: 10/12/2018 Document Reviewed: 10/12/2018 Elsevier Patient Education  2020 [employer].

## 2020-09-19 ENCOUNTER — Other Ambulatory Visit: Payer: Self-pay

## 2020-09-19 ENCOUNTER — Encounter: Payer: Self-pay | Admitting: Family Medicine

## 2020-09-19 ENCOUNTER — Ambulatory Visit (INDEPENDENT_AMBULATORY_CARE_PROVIDER_SITE_OTHER): Payer: 59 | Admitting: Family Medicine

## 2020-09-19 VITALS — BP 128/80 | HR 75 | Resp 16 | Ht 66.0 in | Wt 153.0 lb

## 2020-09-19 DIAGNOSIS — Z0000 Encounter for general adult medical examination without abnormal findings: Secondary | ICD-10-CM

## 2020-09-19 DIAGNOSIS — F5101 Primary insomnia: Secondary | ICD-10-CM

## 2020-09-19 DIAGNOSIS — R5383 Other fatigue: Secondary | ICD-10-CM | POA: Diagnosis not present

## 2020-09-19 DIAGNOSIS — Z5181 Encounter for therapeutic drug level monitoring: Secondary | ICD-10-CM

## 2020-09-19 DIAGNOSIS — M3500 Sicca syndrome, unspecified: Secondary | ICD-10-CM

## 2020-09-19 DIAGNOSIS — Z23 Encounter for immunization: Secondary | ICD-10-CM

## 2020-09-19 DIAGNOSIS — G4700 Insomnia, unspecified: Secondary | ICD-10-CM

## 2020-09-19 DIAGNOSIS — K219 Gastro-esophageal reflux disease without esophagitis: Secondary | ICD-10-CM

## 2020-09-19 DIAGNOSIS — Z13220 Encounter for screening for lipoid disorders: Secondary | ICD-10-CM | POA: Diagnosis not present

## 2020-09-19 DIAGNOSIS — Z114 Encounter for screening for human immunodeficiency virus [HIV]: Secondary | ICD-10-CM

## 2020-09-19 DIAGNOSIS — R7303 Prediabetes: Secondary | ICD-10-CM

## 2020-09-19 DIAGNOSIS — Z1329 Encounter for screening for other suspected endocrine disorder: Secondary | ICD-10-CM | POA: Diagnosis not present

## 2020-09-19 DIAGNOSIS — Z130 Encounter for screening for diseases of the blood and blood-forming organs and certain disorders involving the immune mechanism: Secondary | ICD-10-CM

## 2020-09-19 LAB — LIPID PANEL
Cholesterol: 169 mg/dL (ref 0–200)
HDL: 56.9 mg/dL (ref >39.00)
LDL Cholesterol: 93 mg/dL (ref 0–99)
NonHDL: 112.59
Total CHOL/HDL Ratio: 3
Triglycerides: 98 mg/dL (ref 0.0–149.0)
VLDL: 19.6 mg/dL (ref 0.0–40.0)

## 2020-09-19 LAB — VITAMIN D 25 HYDROXY (VIT D DEFICIENCY, FRACTURES): VITD: 54.87 ng/mL (ref 30.00–100.00)

## 2020-09-19 LAB — HEMOGLOBIN A1C: Hgb A1c MFr Bld: 5.7 % (ref 4.6–6.5)

## 2020-09-19 LAB — TSH: TSH: 1.72 u[IU]/mL (ref 0.35–4.50)

## 2020-09-19 MED ORDER — TRAZODONE HCL 50 MG PO TABS: 25.0000 mg | ORAL_TABLET | Freq: Every evening | ORAL | 3 refills | Status: DC | PRN

## 2020-09-19 MED ORDER — PANTOPRAZOLE SODIUM 40 MG PO TBEC: 40.0000 mg | DELAYED_RELEASE_TABLET | Freq: Every day | ORAL | 3 refills | Status: DC

## 2020-09-19 NOTE — Addendum Note (Signed)
Addended by: Wynonia Musty A on: 09/19/2020 01:11 PM   Modules accepted: Orders

## 2020-09-20 LAB — HIV ANTIBODY (ROUTINE TESTING W REFLEX): HIV 1&2 Ab, 4th Generation: NONREACTIVE

## 2020-10-14 ENCOUNTER — Encounter: Payer: Self-pay | Admitting: Family Medicine

## 2020-10-14 DIAGNOSIS — J209 Acute bronchitis, unspecified: Secondary | ICD-10-CM

## 2020-10-14 MED ORDER — DOXYCYCLINE HYCLATE 100 MG PO CAPS: 100.0000 mg | ORAL_CAPSULE | Freq: Two times a day (BID) | ORAL | 0 refills | Status: DC

## 2021-03-31 ENCOUNTER — Encounter: Payer: Self-pay | Admitting: Family Medicine

## 2021-04-03 ENCOUNTER — Ambulatory Visit: Payer: 59 | Admitting: Family Medicine

## 2021-05-29 NOTE — Progress Notes (Signed)
[facility] [address], [place], [place] [address] [phone]  Date:  05/30/2021   Name:  Katrina Ford   DOB:  1961/07/12   MRN:  [id number]  PCP:  Darreld Mclean, MD    Chief Complaint: right elbow pain (It has turned into the whole arm going on a month. )   History of Present Illness:  Katrina Ford is a 60 y.o. very pleasant female patient who presents with the following:  History of OSA, GERD, sjogren's syndrome, polyarthrlagia, pre-diabetes, depression, FBM, kidney stones Patient seen today with concern of elbow pain into her hand  Last visit with myself was in November for physical exam At that time she was working from home which was a good situation for her.  She was feeling less stressed, exercising more and has lost 10 pounds  Taking celebrex once a day - she started taking this twice a day a couple of weeks ago but it did not help so she reduced to once a day  Rheumatologist is Dr. Trudie Reed  Shingles vaccine COVID-19 booster Pap smear may be due; she sees [facility]  Some labs on chart from November  She notes pain from her right elbow or about one month The pain has traveled into her hand.  It was hurting more in her 4th and 5th fingers, but now the long and index fingers are also involved and the pain is running up her arm as well   She has pain if she lifts or if she is typing She has tried tylenol, voltaren gel- these help a bit  Never had this type of pain in the past  NKI  No neck pain noted   She is left handed   She admits that she is feeling a bit down today, her uncle recently passed away and the funeral is this weekend Patient Active Problem List   Diagnosis Date Noted   Prediabetes 09/18/2020   Fibromyalgia 10/10/2018   Sjogren's syndrome ([place]) 06/27/2018   Urine incontinence 10/03/2015   Polyarthralgia 08/19/2015   OSA (obstructive sleep apnea)  08/19/2015   Physical exam 08/14/2014   Menopausal vaginal dryness 08/14/2014   Depression 01/05/2014   Abdominal pain, RLQ (right lower quadrant) 10/05/2011   Abdominal pain 10/01/2011   Constipation, slow transit 10/01/2011   Abnormal liver enzymes 10/01/2011   IRRITABLE BOWEL SYNDROME 07/18/2010   GERD 12/01/2007   HIATAL HERNIA 12/01/2007   HEADACHE, CHRONIC 12/01/2007   GASTRITIS 07/27/2000    Past Medical History:  Diagnosis Date   Chronic headache    Depression    Elevated hemoglobin A1c    pre diabetic   Family history of malignant neoplasm of gastrointestinal tract    Frozen shoulder syndrome    Gastritis    GERD (gastroesophageal reflux disease)    Hiatal hernia    IBS (irritable bowel syndrome)    Kidney stones    Sciatic leg pain    May 2018   Sjogren's disease ([place])    Sjogren's syndrome ([place]) 06/27/2018   With inflammatory arthritis. [facility] Rheumatology Dr. Trudie Reed     Past Surgical History:  Procedure Laterality Date   CHOLECYSTECTOMY  2000   ESOPHAGEAL MANOMETRY  11/28/2012   Procedure: ESOPHAGEAL MANOMETRY (EM);  Surgeon: Sable Feil, MD;  Location: [facility];  Service: Endoscopy;  Laterality: N/A;   FOOT SURGERY Bilateral 03/2013   LITHOTRIPSY  [id number]  TONSILLECTOMY      Social History   Tobacco Use   Smoking status: Never   Smokeless tobacco: Never  Vaping Use   Vaping Use: Never used  Substance Use Topics   Alcohol use: No   Drug use: No    Family History  Problem Relation Age of Onset   Diabetes Mother    Heart disease Mother    Diverticulitis Mother    Hyperlipidemia Mother    Hyperlipidemia Father    Cancer Father        skin cancer   Fibromyalgia Cousin    Fibromyalgia Cousin    Fibromyalgia Cousin    Lupus Cousin    Lupus Cousin    Colon cancer Maternal Uncle 37       Passed away in 02-12-2009   Heart disease Paternal Grandmother        Siblings also   Hyperlipidemia Brother    Hyperlipidemia Sister     Breast cancer Paternal Aunt    Diabetes Maternal Uncle    Heart disease Maternal Uncle     No Known Allergies  Medication list has been reviewed and updated.  Current Outpatient Medications on File Prior to Visit  Medication Sig Dispense Refill   Cholecalciferol (VITAMIN D3) 3000 units TABS Take by mouth daily.      folic acid (FOLVITE) 1 MG tablet Take 2 mg by mouth daily.   6   hydroxychloroquine (PLAQUENIL) 200 MG tablet 400 mg daily.      methotrexate (RHEUMATREX) 2.5 MG tablet TK 6 TS PO 1 TIME A WK  6   pantoprazole (PROTONIX) 40 MG tablet Take 1 tablet (40 mg total) by mouth daily. 90 tablet 3   traZODone (DESYREL) 50 MG tablet Take 0.5-1 tablets (25-50 mg total) by mouth at bedtime as needed. for sleep 90 tablet 3   No current facility-administered medications on file prior to visit.    Review of Systems:  As per HPI- otherwise negative.   Physical Examination: Vitals:   05/30/21 1306  BP: 138/60  Pulse: 96  Temp: 98 F (36.7 C)  SpO2: 97%   Vitals:   05/30/21 1306  Weight: 155 lb 12.8 oz (70.7 kg)  Height: '5\' 5"'$  (1.651 m)   Body mass index is 25.93 kg/m. Ideal Body Weight: Weight in (lb) to have BMI = 25: 149.9  GEN: no acute distress.  Normal weight, looks well HEENT: Atraumatic, Normocephalic.  Ears and Nose: No external deformity. CV: RRR, No M/G/R. No JVD. No thrill. No extra heart sounds. PULM: CTA B, no wheezes, crackles, rhonchi. No retractions. No resp. distress. No accessory muscle use. EXTR: No c/c/e PSYCH: Normally interactive. Conversant.   Right elbow exam: No redness, no heat or swelling Normal range of motion, but she has some tenderness with full extension of the elbow. Pain with resisted pronation of the right elbow She is quite tender over the lateral epicondyle  Slightly reduced grip on the right due to pain  Normal cervical range of motion, no tenderness in her neck.  She does have some spasm and tenderness of the right trapezius  muscle Assessment and Plan: Lateral epicondylitis of right elbow - Plan: predniSONE (DELTASONE) 20 MG tablet, cyclobenzaprine (FLEXERIL) 10 MG tablet Patient seen today with likely lateral epicondylitis pain.  Prescribed prednisone and Flexeril.   Advised not to drive while taking Flexeril, take Flexeril or trazodone at bedtime, not both.  Avoid Celebrex while taking prednisone   She might also try ice and/or  a tennis elbow strap   She does have orthopedics care, she sees Dr. Rip Harbour at [facility].  I asked her to call and see if she can have appointment for next week  She will let me know if any other concerns This visit occurred during the SARS-CoV-2 public health emergency.  Safety protocols were in place, including screening questions prior to the visit, additional usage of staff PPE, and extensive cleaning of exam room while observing appropriate contact time as indicated for disinfecting solutions.   Signed Lamar Blinks, MD

## 2021-05-30 ENCOUNTER — Ambulatory Visit: Payer: 59 | Admitting: Family Medicine

## 2021-05-30 ENCOUNTER — Other Ambulatory Visit: Payer: Self-pay

## 2021-05-30 VITALS — BP 138/60 | HR 96 | Temp 98.0°F | Ht 65.0 in | Wt 155.8 lb

## 2021-05-30 DIAGNOSIS — M7711 Lateral epicondylitis, right elbow: Secondary | ICD-10-CM | POA: Diagnosis not present

## 2021-05-30 MED ORDER — PREDNISONE 20 MG PO TABS: ORAL_TABLET | ORAL | 0 refills | Status: DC

## 2021-05-30 MED ORDER — CYCLOBENZAPRINE HCL 10 MG PO TABS: 10.0000 mg | ORAL_TABLET | Freq: Two times a day (BID) | ORAL | 0 refills | Status: DC | PRN

## 2021-05-30 NOTE — Patient Instructions (Addendum)
We are going to treat you for your elbow pain with steroids and flexeril- use as directed, you might also try using some ice.   Don't drive while taking flexeril- it can make you sleepy Avoid celebrex while you are on the prednisone- the combo may bother your stomach  Please give [facility] a call and see if they can get you in next week A tennis elbow strap may be helpful  Let me know if I can help in any way!

## 2021-10-07 ENCOUNTER — Encounter: Payer: Self-pay | Admitting: Internal Medicine

## 2021-11-13 ENCOUNTER — Other Ambulatory Visit: Payer: Self-pay | Admitting: Family Medicine

## 2021-11-13 ENCOUNTER — Telehealth (INDEPENDENT_AMBULATORY_CARE_PROVIDER_SITE_OTHER): Payer: 59 | Admitting: Family Medicine

## 2021-11-13 ENCOUNTER — Encounter: Payer: Self-pay | Admitting: Family Medicine

## 2021-11-13 VITALS — Temp 100.1°F

## 2021-11-13 DIAGNOSIS — U071 COVID-19: Secondary | ICD-10-CM | POA: Diagnosis not present

## 2021-11-13 MED ORDER — BENZONATATE 100 MG PO CAPS: ORAL_CAPSULE | ORAL | 0 refills | Status: DC

## 2021-11-13 MED ORDER — NIRMATRELVIR/RITONAVIR (PAXLOVID)TABLET: 3.0000 | ORAL_TABLET | Freq: Two times a day (BID) | ORAL | 0 refills | Status: AC

## 2021-11-13 MED ORDER — ALBUTEROL SULFATE HFA 108 (90 BASE) MCG/ACT IN AERS: 2.0000 | INHALATION_SPRAY | Freq: Four times a day (QID) | RESPIRATORY_TRACT | 0 refills | Status: DC | PRN

## 2021-11-13 NOTE — Progress Notes (Signed)
Virtual Visit via Telephone Note  I connected with Katrina Ford on 11/13/21 at 11:40 AM EST by telephone and verified that I am speaking with the correct person using two identifiers.   I discussed the limitations of performing an evaluation and management service by telephone and requested permission for a phone visit. The patient expressed understanding and agreed to proceed.  Location patient:  [place] Location provider: work or home office Participants present for the call: patient, provider Patient did not have a visit with me in the prior 7 days to address this/these issue(s).   History of Present Illness:  Acute telemedicine visit for Covid19: -Onset: 3 days ago, tested positive for covid yesterday -Symptoms include: nasal congestion, pnd, cough, fever 100.1 currently, body aches, diarrhea -Denies: CP, SOB, vomiting -able to drink fluids -Pertinent past medical history: see below -Pertinent medication allergies: No Known Allergies -COVID-19 vaccine status: Immunization History  Administered Date(s) Administered   Influenza Inj Mdck Quad Pf 09/13/2018   Influenza,inj,Quad PF,6+ Mos 09/19/2020   Influenza-Unspecified 08/14/2019   PFIZER(Purple Top)SARS-COV-2 Vaccination 01/12/2020, 02/02/2020   Pfizer Covid-19 Vaccine Bivalent Booster 20yr & up 09/02/2021   Tdap 08/15/2011  -eGFR was 97 on 10/30/21 per patient on labs with her rheumatologist    Past Medical History:  Diagnosis Date   Chronic headache    Depression    Elevated hemoglobin A1c    pre diabetic   Family history of malignant neoplasm of gastrointestinal tract    Frozen shoulder syndrome    Gastritis    GERD (gastroesophageal reflux disease)    Hiatal hernia    IBS (irritable bowel syndrome)    Kidney stones    Sciatic leg pain    May 2018   [place]'s [place]    [place] 06/27/2018   [facility]    Current Outpatient  Medications on File Prior to Visit  Medication Sig Dispense Refill   celecoxib (CELEBREX) 200 MG capsule Take 200 mg by mouth daily.     Cholecalciferol (VITAMIN D3) 3000 units TABS Take by mouth daily.      Cyanocobalamin (B-12 PO) Take 5,000 Units by mouth daily.     cyclobenzaprine (FLEXERIL) 10 MG tablet Take 1 tablet (10 mg total) by mouth 2 (two) times daily as needed for muscle spasms. 30 tablet 0   folic acid (FOLVITE) 1 MG tablet Take 2 mg by mouth daily.   6   hydroxychloroquine (PLAQUENIL) 200 MG tablet 400 mg daily.      methotrexate (RHEUMATREX) 2.5 MG tablet TK 6 TS PO 1 TIME A WK  6   pantoprazole (PROTONIX) 40 MG tablet Take 1 tablet (40 mg total) by mouth daily. 90 tablet 3   traZODone (DESYREL) 50 MG tablet Take 0.5-1 tablets (25-50 mg total) by mouth at bedtime as needed. for sleep 90 tablet 3   No current facility-administered medications on file prior to visit.  Reports only take trazadone as needed and not taking - will hold during paxlovid tx  Observations/Objective: Patient sounds cheerful and well on the phone. I do not appreciate any SOB. Speech and thought processing are grossly intact. Patient reported vitals:  Assessment and Plan:  No diagnosis found.   Discussed treatment options and risk of drug interactions, ideal treatment window, potential complications, isolation and precautions for COVID-19.  Discussed possibility of rebound with antivirals and the need to reisolate if it should occur for 5 days. Checked for/reviewed any labs done  in the last 90 days with GFR listed in HPI if available - pt pulled up labs from rheumatologist done last month. After lengthy discussion, the patient opted for treatment with Paxlovid due to being higher risk for complications of covid or severe disease and other factors. Discussed EUA status of this drug and the fact that there is preliminary limited knowledge of risks/interactions/side effects per EUA document vs possible  benefits and precautions. This information was shared with patient during the visit and also was provided in patient instructions. She rarely uses trazadone and will hold. She will check with her rheumatologist on her other meds. Also, advised that patient discuss risks/interactions and use with pharmacist/treatment team as well.  The patient did want a prescription for cough, Tessalon Rx sent.  Also alb inhaler to use q6 hr prn per her request. Other symptomatic care measures summarized in patient instructions.  Work/School slipped offered: declined Advised to seek prompt virtual visit or in person care if worsening, new symptoms arise, or if is not improving with treatment as expected per our conversation of expected course. Discussed options for follow up care. Did let this patient know that I do telemedicine on Tuesdays and Thursdays for [facility] and those are the days I am logged into the system. Advised to schedule follow up visit with PCP, [place] virtual visits or [facility] if any further questions or concerns to avoid delays in care.   I discussed the assessment and treatment plan with the patient. The patient was provided an opportunity to ask questions and all were answered. The patient agreed with the plan and demonstrated an understanding of the instructions.    Follow Up Instructions:  I did not refer this patient for an OV with me in the next 24 hours for this/these issue(s).  I discussed the assessment and treatment plan with the patient. The patient was provided an opportunity to ask questions and all were answered. The patient agreed with the plan and demonstrated an understanding of the instructions.   I spent 18 minutes on the date of this visit in the care of this patient. See summary of tasks completed to properly care for this patient in the detailed notes above which also included counseling of above, review of PMH, medications, allergies, evaluation of the patient and ordering  and/or  instructing patient on testing and care options.     Lucretia Kern, DO

## 2021-11-13 NOTE — Patient Instructions (Addendum)
HOME CARE TIPS:   -I sent the medication(s) we discussed to your pharmacy: Meds ordered this encounter  Medications   nirmatrelvir/ritonavir EUA (PAXLOVID) 20 x 150 MG & 10 x 100MG TABS    Sig: Take 3 tablets by mouth 2 (two) times daily for 5 days. (Take nirmatrelvir 150 mg two tablets twice daily for 5 days and ritonavir 100 mg one tablet twice daily for 5 days) Patient GFR is > 60 per patient report on recent labs    Dispense:  30 tablet    Refill:  0   benzonatate (TESSALON PERLES) 100 MG capsule    Sig: 1-2 capsules twice daily as needed for cough    Dispense:  30 capsule    Refill:  0   albuterol (PROAIR HFA) 108 (90 Base) MCG/ACT inhaler    Sig: Inhale 2 puffs into the lungs every 6 (six) hours as needed for wheezing or shortness of breath.    Dispense:  1 each    Refill:  0     -I sent in the [place] treatment or referral you requested per our discussion. Please see the information provided below and discuss further with the pharmacist/treatment team.  -If taking Paxlovid, please review all medications, supplement and over the counter drugs with your pharmacist and ask them to check for any interactions. Please make the following changes to your regular medications while taking Paxlovid: *don't take trazadone while on paxlovid *check with your rheumatologist regarding your immunomodulating drugs while sick and on paxlovid  -there is a chance of rebound illness after finishing your treatment. If you become sick again please isolate for an additional 5 days, plus 5 more days of masking.    -can use nasal saline a few times per day if you have nasal congestion  -stay hydrated, drink plenty of fluids and eat small healthy meals - avoid dairy  -follow up with your doctor in 2-3 days unless improving and feeling better  -stay home while sick, except to seek medical care. If you have COVID19, you will likely be contagious for 7-10 days. Flu or Influenza is likely contagious for  about 7 days. Other respiratory viral infections remain contagious for 5-10+ days depending on the virus and many other factors. Wear a good mask that fits snugly (such as N95 or KN95) if around others to reduce the risk of transmission.  It was nice to meet you today, and I really hope you are feeling better soon. I help [place] out with telemedicine visits on Tuesdays and Thursdays and am happy to help if you need a follow up virtual visit on those days. Otherwise, if you have any concerns or questions following this visit please schedule a follow up visit with your Primary Care doctor or seek care at a local urgent care clinic to avoid delays in care.    Seek in person care or schedule a follow up video visit promptly if your symptoms worsen, new concerns arise or you are not improving with treatment. Call 911 and/or seek emergency care if your symptoms are severe or life threatening.  FACT SHEET FOR PATIENTS, PARENTS, AND CAREGIVERS EMERGENCY USE AUTHORIZATION (EUA) OF PAXLOVID FOR CORONAVIRUS DISEASE 2019 (COVID-19) You are being given this Fact Sheet because your healthcare provider believes it is necessary to provide you with PAXLOVID for the treatment of mild-to-moderate coronavirus disease (COVID-19) caused by the SARS-CoV-2 virus. This Fact Sheet contains information to help you understand the risks and benefits of taking the PAXLOVID you  have received or may receive. The U.S. Food and Drug Administration (FDA) has issued an Emergency Use Authorization (EUA) to make PAXLOVID available during the COVID-19 pandemic (for more details about an EUA please see What is an Emergency Use Authorization? at the end of this document). PAXLOVID is not an FDA-approved medicine in the [place]. Read this Fact Sheet for information about PAXLOVID. Talk to your healthcare provider about your options or if you have any questions. It is your choice to take PAXLOVID.  What is  COVID-19? COVID-19 is caused by a virus called a coronavirus. You can get COVID-19 through close contact with another person who has the virus. COVID-19 illnesses have ranged from very mild-to-severe, including illness resulting in death. While information so far suggests that most COVID-19 illness is mild, serious illness can happen and may cause some of your other medical conditions to become worse. Older people and people of all ages with severe, long lasting (chronic) medical conditions like heart disease, lung disease, and diabetes, for example seem to be at higher risk of being hospitalized for COVID-19.  What is PAXLOVID? PAXLOVID is an investigational medicine used to treat mild-to-moderate COVID-19 in adults and children [43 years of age and older weighing at least 75 pounds (56 kg)] with positive results of direct SARS-CoV-2 viral testing, and who are at high risk for progression to severe COVID-19, including hospitalization or death. PAXLOVID is investigational because it is still being studied. There is limited information about the safety and effectiveness of using PAXLOVID to treat people with mild-to-moderate COVID-19.  The FDA has authorized the emergency use of PAXLOVID for the treatment of mild-tomoderate COVID-19 in adults and children [9 years of age and older weighing at least 59 pounds (68 kg)] with a positive test for the virus that causes COVID-19, and who are at high risk for progression to severe COVID-19, including hospitalization or death, under an EUA. 1 Revised: 17 January 2021   What should I tell my healthcare provider before I take PAXLOVID? Tell your healthcare provider if you: ? Have any allergies ? Have liver or kidney disease ? Are pregnant or plan to become pregnant ? Are breastfeeding a child ? Have any serious illnesses  Tell your healthcare provider about all the medicines you take, including prescription and over-the-counter medicines,  vitamins, and herbal supplements. Some medicines may interact with PAXLOVID and may cause serious side effects. Keep a list of your medicines to show your healthcare provider and pharmacist when you get a new medicine.  You can ask your healthcare provider or pharmacist for a list of medicines that interact with PAXLOVID. Do not start taking a new medicine without telling your healthcare provider. Your healthcare provider can tell you if it is safe to take PAXLOVID with other medicines.  Tell your healthcare provider if you are taking combined hormonal contraceptive. PAXLOVID may affect how your birth control pills work. Females who are able to become pregnant should use another effective alternative form of contraception or an additional barrier method of contraception. Talk to your healthcare provider if you have any questions about contraceptive methods that might be right for you.  How do I take PAXLOVID? ? PAXLOVID consists of 2 medicines: nirmatrelvir and ritonavir. o Take 2 pink tablets of nirmatrelvir with 1 white tablet of ritonavir by mouth 2 times each day (in the morning and in the evening) for 5 days. For each dose, take all 3 tablets at the same time. o If you have  kidney disease, talk to your healthcare provider. You may need a different dose. ? Swallow the tablets whole. Do not chew, break, or crush the tablets. ? Take PAXLOVID with or without food. ? Do not stop taking PAXLOVID without talking to your healthcare provider, even if you feel better. ? If you miss a dose of PAXLOVID within 8 hours of the time it is usually taken, take it as soon as you remember. If you miss a dose by more than 8 hours, skip the missed dose and take the next dose at your regular time. Do not take 2 doses of PAXLOVID at the same time. ? If you take too much PAXLOVID, call your healthcare provider or go to the nearest hospital emergency room right away. ? If you are taking a ritonavir- or  cobicistat-containing medicine to treat hepatitis C or Human Immunodeficiency Virus (HIV), you should continue to take your medicine as prescribed by your healthcare provider. 2 Revised: 17 January 2021    Talk to your healthcare provider if you do not feel better or if you feel worse after 5 days.  Who should generally not take PAXLOVID? Do not take PAXLOVID if: ? You are allergic to nirmatrelvir, ritonavir, or any of the ingredients in PAXLOVID. ? You are taking any of the following medicines: o Alfuzosin o Pethidine, propoxyphene o Ranolazine o Amiodarone, dronedarone, flecainide, propafenone, quinidine o Colchicine o Lurasidone, pimozide, clozapine o Dihydroergotamine, ergotamine, methylergonovine o Lovastatin, simvastatin o Sildenafil (Revatio) for pulmonary arterial hypertension (PAH) o Triazolam, oral midazolam o Apalutamide o Carbamazepine, phenobarbital, phenytoin o Rifampin o St. Johns Wort (hypericum perforatum) Taking PAXLOVID with these medicines may cause serious or life-threatening side effects or affect how PAXLOVID works.  These are not the only medicines that may cause serious side effects if taken with PAXLOVID. PAXLOVID may increase or decrease the levels of multiple other medicines. It is very important to tell your healthcare provider about all of the medicines you are taking because additional laboratory tests or changes in the dose of your other medicines may be necessary while you are taking PAXLOVID. Your healthcare provider may also tell you about specific symptoms to watch out for that may indicate that you need to stop or decrease the dose of some of your other medicines.  What are the important possible side effects of PAXLOVID? Possible side effects of PAXLOVID are: ? Allergic Reactions. Allergic reactions can happen in people taking PAXLOVID, even after only 1 dose. Stop taking PAXLOVID and call your healthcare provider right away if you get  any of the following symptoms of an allergic reaction: o hives o trouble swallowing or breathing o swelling of the mouth, lips, or face o throat tightness o hoarseness 3 Revised: 17 January 2021  o skin rash ? Liver Problems. Tell your healthcare provider right away if you have any of these signs and symptoms of liver problems: loss of appetite, yellowing of your skin and the whites of eyes (jaundice), dark-colored urine, pale colored stools and itchy skin, stomach area (abdominal) pain. ? Resistance to HIV Medicines. If you have untreated HIV infection, PAXLOVID may lead to some HIV medicines not working as well in the future. ? Other possible side effects include: o altered sense of taste o diarrhea o high blood pressure o muscle aches These are not all the possible side effects of PAXLOVID. Not many people have taken PAXLOVID. Serious and unexpected side effects may happen. PAXLOVID is still being studied, so it is possible that  all of the risks are not known at this time.  What other treatment choices are there? Veklury (remdesivir) is FDA-approved for the treatment of mild-to-moderate DDUKG-25 in certain adults and children. Talk with your doctor to see if Marijean Heath is appropriate for you. Like PAXLOVID, FDA may also allow for the emergency use of other medicines to treat people with COVID-19. Go to https://price.info/ for information on the emergency use of other medicines that are authorized by FDA to treat people with COVID-19. Your healthcare provider may talk with you about clinical trials for which you may be eligible. It is your choice to be treated or not to be treated with PAXLOVID. Should you decide not to receive it or for your child not to receive it, it will not change your standard medical care.  What if I am pregnant or breastfeeding? There is no experience  treating pregnant women or breastfeeding mothers with PAXLOVID. For a mother and unborn baby, the benefit of taking PAXLOVID may be greater than the risk from the treatment. If you are pregnant, discuss your options and specific situation with your healthcare provider. It is recommended that you use effective barrier contraception or do not have sexual activity while taking PAXLOVID. If you are breastfeeding, discuss your options and specific situation with your healthcare provider. 4 Revised: 17 January 2021   How do I report side effects with PAXLOVID? Contact your healthcare provider if you have any side effects that bother you or do not go away. Report side effects to FDA MedWatch at SmoothHits.hu or call 1-800-FDA1088 or you can report side effects to [employer]. at the contact information provided below. Website Fax number Telephone number www.pfizersafetyreporting.com ([phone] How should I store [place]? Store PAXLOVID tablets at room temperature, between 68?F to 77?F (20?C to 25?C). How can I learn more about COVID-19? ? Ask your healthcare provider. ? Visit https://jacobson-johnson.com/. ? Contact your local or state public health department. What is an Emergency Use Authorization (EUA)? The [place] FDA has made PAXLOVID available under an emergency access mechanism called an Emergency Use Authorization (EUA). The EUA is supported by a Education officer, museum and Human Service (HHS) declaration that circumstances exist to justify the emergency use of drugs and biological products during the COVID-19 pandemic. PAXLOVID for the treatment of mild-to-moderate COVID-19 in adults and children [35 years of age and older weighing at least 14 pounds (45 kg)] with positive results of direct SARS-CoV-2 viral testing, and who are at high risk for progression to severe COVID-19, including hospitalization or death, has not undergone the same type of review as  an FDA-approved product. In issuing an EUA under the XTGGY-69 public health emergency, the FDA has determined, among other things, that based on the total amount of scientific evidence available including data from adequate and well-controlled clinical trials, if available, it is reasonable to believe that the product may be effective for diagnosing, treating, or preventing COVID-19, or a serious or life-threatening disease or condition caused by COVID-19; that the known and potential benefits of the product, when used to diagnose, treat, or prevent such disease or condition, outweigh the known and potential risks of such product; and that there are no adequate, approved, and available alternatives. All of these criteria must be met to allow for the product to be used in the treatment of patients during the COVID-19 pandemic. The EUA for PAXLOVID is in effect for the duration of the COVID-19 declaration justifying emergency use of this product, unless terminated  or revoked (after which the products may no longer be used under the EUA). 5 Revised: 17 January 2021     Additional Information For general questions, visit the website or call the telephone number provided below. Website Telephone number www.COVID19oralRx.com [phone] (1-877-C19-PACK) You can also go to www.pfizermedinfo.com or call [phone] for more information. [id number] Revised: 17 January 2021

## 2021-11-25 ENCOUNTER — Ambulatory Visit: Payer: 59 | Admitting: Family Medicine

## 2021-11-25 ENCOUNTER — Encounter: Payer: Self-pay | Admitting: Family Medicine

## 2021-11-25 VITALS — BP 125/66 | HR 77 | Temp 98.0°F | Resp 16 | Ht 65.0 in | Wt 161.6 lb

## 2021-11-25 DIAGNOSIS — J329 Chronic sinusitis, unspecified: Secondary | ICD-10-CM | POA: Diagnosis not present

## 2021-11-25 DIAGNOSIS — J40 Bronchitis, not specified as acute or chronic: Secondary | ICD-10-CM | POA: Diagnosis not present

## 2021-11-25 MED ORDER — HYDROCOD POLI-CHLORPHE POLI ER 10-8 MG/5ML PO SUER: 5.0000 mL | Freq: Two times a day (BID) | ORAL | 0 refills | Status: AC | PRN

## 2021-11-25 MED ORDER — PREDNISONE 20 MG PO TABS: 40.0000 mg | ORAL_TABLET | Freq: Every day | ORAL | 0 refills | Status: AC

## 2021-11-25 MED ORDER — AMOXICILLIN-POT CLAVULANATE 875-125 MG PO TABS: 1.0000 | ORAL_TABLET | Freq: Two times a day (BID) | ORAL | 0 refills | Status: DC

## 2021-11-25 NOTE — Patient Instructions (Signed)
Start antibiotics, prednisone, and night time cough syrup.   Continue supportive measures including rest, hydration, humidifier use, steam showers, warm compresses to sinuses, warm liquids with lemon and honey, and over-the-counter cough, cold, and analgesics as needed.   Please contact office for follow-up if symptoms do not improve or worsen. Seek emergency care if symptoms become severe.

## 2021-11-25 NOTE — Progress Notes (Signed)
Acute Office Visit  Subjective:    Patient ID: Katrina Ford, female    DOB: [birth year], 61 y.o.   MRN: [id number]  Chief Complaint  Patient presents with   ongoing cough    Had covid 11/12/21   pain in chest and back   Neck Pain   itching on both arms    HPI Patient is in today for post-COVID URI symptoms.   Patient was positive for COVID on January limits.  Reports symptoms include cough, headache, body aches, diarrhea, malaise, fatigue.  She took Paxlovid, Tessalon, albuterol inhaler.  States she started to feel better for a few days but then symptoms began to return.  She is now experiencing nonproductive cough that keeps her up at night, dyspnea, back and chest soreness from coughing, fatigue, left ear pressure, sore throat, nasal congestion, occasional generalized itching.  She took another COVID test on Friday and yesterday which both came back negative.  She denies any recent fevers, headaches, nausea, vomiting, wheezing, chest pain.     Past Medical History:  Diagnosis Date   Chronic headache    Depression    Elevated hemoglobin A1c    pre diabetic   Family history of malignant neoplasm of gastrointestinal tract    Frozen shoulder syndrome    Gastritis    GERD (gastroesophageal reflux disease)    Hiatal hernia    IBS (irritable bowel syndrome)    Kidney stones    Sciatic leg pain    May 2018   Sjogren's disease [facility])    Sjogren's syndrome ([place]) 06/27/2018   With inflammatory arthritis. [facility] Rheumatology Dr. Trudie Reed     Past Surgical History:  Procedure Laterality Date   CHOLECYSTECTOMY  2000   ESOPHAGEAL MANOMETRY  11/28/2012   Procedure: ESOPHAGEAL MANOMETRY (EM);  Surgeon: Sable Feil, MD;  Location: [facility] ENDOSCOPY;  Service: Endoscopy;  Laterality: N/A;   FOOT SURGERY Bilateral 03/2013   LITHOTRIPSY  2003&2012   TONSILLECTOMY      Family History  Problem Relation Age of Onset   Diabetes Mother    Heart disease Mother    Diverticulitis  Mother    Hyperlipidemia Mother    Hyperlipidemia Father    Cancer Father        skin cancer   Fibromyalgia Cousin    Fibromyalgia Cousin    Fibromyalgia Cousin    Lupus Cousin    Lupus Cousin    Colon cancer Maternal Uncle 37       Passed away in 2009/02/09   Heart disease Paternal Grandmother        Siblings also   Hyperlipidemia Brother    Hyperlipidemia Sister    Breast cancer Paternal Aunt    Diabetes Maternal Uncle    Heart disease Maternal Uncle     Social History   Socioeconomic History   Marital status: Married    Spouse name: Not on file   Number of children: 2   Years of education: Not on file   Highest education level: Not on file  Occupational History   Not on file  Tobacco Use   Smoking status: Never   Smokeless tobacco: Never  Vaping Use   Vaping Use: Never used  Substance and Sexual Activity   Alcohol use: No   Drug use: No   Sexual activity: Yes    Birth control/protection: None  Other Topics Concern   Not on file  Social History Narrative   Not on file   Social Determinants  of Health   Financial Resource Strain: Not on file  Food Insecurity: Not on file  Transportation Needs: Not on file  Physical Activity: Not on file  Stress: Not on file  Social Connections: Not on file  Intimate Partner Violence: Not on file    Outpatient Medications Prior to Visit  Medication Sig Dispense Refill   albuterol (VENTOLIN HFA) 108 (90 Base) MCG/ACT inhaler INHALE 2 PUFFS INTO THE LUNGS EVERY 6 HOURS AS NEEDED FOR WHEEZING OR SHORTNESS OF BREATH 20.1 g 0   benzonatate (TESSALON PERLES) 100 MG capsule 1-2 capsules twice daily as needed for cough 30 capsule 0   celecoxib (CELEBREX) 200 MG capsule Take 200 mg by mouth daily.     Cholecalciferol (VITAMIN D3) 3000 units TABS Take by mouth daily.      Cyanocobalamin (B-12 PO) Take 5,000 Units by mouth daily.     cyclobenzaprine (FLEXERIL) 10 MG tablet Take 1 tablet (10 mg total) by mouth 2 (two) times daily as  needed for muscle spasms. 30 tablet 0   folic acid (FOLVITE) 1 MG tablet Take 2 mg by mouth daily.   6   hydroxychloroquine (PLAQUENIL) 200 MG tablet 400 mg daily.      methotrexate (RHEUMATREX) 2.5 MG tablet TK 6 TS PO 1 TIME A WK  6   pantoprazole (PROTONIX) 40 MG tablet Take 1 tablet (40 mg total) by mouth daily. 90 tablet 3   traZODone (DESYREL) 50 MG tablet Take 0.5-1 tablets (25-50 mg total) by mouth at bedtime as needed. for sleep 90 tablet 3   No facility-administered medications prior to visit.    No Known Allergies  Review of Systems All review of systems negative except what is listed in the HPI     Objective:    Physical Exam Vitals reviewed.  Constitutional:      Appearance: Normal appearance.  HENT:     Head: Normocephalic and atraumatic.     Right Ear: A middle ear effusion is present.     Left Ear: A middle ear effusion is present.     Nose: Congestion present.     Mouth/Throat:     Mouth: Mucous membranes are moist.     Pharynx: Oropharynx is clear. Posterior oropharyngeal erythema present.  Cardiovascular:     Rate and Rhythm: Normal rate and regular rhythm.  Pulmonary:     Effort: Pulmonary effort is normal. No respiratory distress.     Breath sounds: Normal breath sounds. No wheezing, rhonchi or rales.  Skin:    General: Skin is warm and dry.  Neurological:     General: No focal deficit present.     Mental Status: She is alert and oriented to person, place, and time.  Psychiatric:        Mood and Affect: Mood normal.        Behavior: Behavior normal.        Thought Content: Thought content normal.        Judgment: Judgment normal.    BP 125/66 (BP Location: Left Arm, Cuff Size: Large)    Pulse 77    Temp 98 F (36.7 C) (Oral)    Resp 16    Ht 5\' 5"  (1.651 m)    Wt 161 lb 9.6 oz (73.3 kg)    SpO2 100%    BMI 26.89 kg/m  Wt Readings from Last 3 Encounters:  11/25/21 161 lb 9.6 oz (73.3 kg)  05/30/21 155 lb 12.8 oz (70.7 kg)  09/19/20 153  lb (69.4  kg)    Health Maintenance Due  Topic Date Due   Zoster Vaccines- Shingrix (1 of 2) Never done   PAP SMEAR-Modifier  03/02/2021   MAMMOGRAM  05/25/2021   TETANUS/TDAP  08/14/2021   COVID-19 Vaccine (4 - Booster for Pfizer series) 10/28/2021    There are no preventive care reminders to display for this patient.   Lab Results  Component Value Date   TSH 1.72 09/19/2020   Lab Results  Component Value Date   WBC 5.0 01/13/2018   HGB 14.4 01/13/2018   HCT 42.1 01/13/2018   MCV 94.0 01/13/2018   PLT 267.0 01/13/2018   Lab Results  Component Value Date   NA 141 01/13/2018   K 4.0 01/13/2018   CO2 32 01/13/2018   GLUCOSE 97 01/13/2018   BUN 13 01/13/2018   CREATININE 0.85 01/13/2018   BILITOT 0.5 01/13/2018   ALKPHOS 59 01/13/2018   AST 14 01/13/2018   ALT 12 01/13/2018   PROT 6.9 01/13/2018   ALBUMIN 4.7 01/13/2018   CALCIUM 9.7 01/13/2018   GFR 73.32 01/13/2018   Lab Results  Component Value Date   CHOL 169 09/19/2020   Lab Results  Component Value Date   HDL 56.90 09/19/2020   Lab Results  Component Value Date   LDLCALC 93 09/19/2020   Lab Results  Component Value Date   TRIG 98.0 09/19/2020   Lab Results  Component Value Date   CHOLHDL 3 09/19/2020   Lab Results  Component Value Date   HGBA1C 5.7 09/19/2020       Assessment & Plan:    1. Sinobronchitis Double sickening and duration >10 days so starting antibiotics. Adding a prednisone burst and cough syrup. Continue supportive measures including rest, hydration, humidifier use, steam showers, warm compresses to sinuses, warm liquids with lemon and honey, and over-the-counter cough, cold, and analgesics as needed.  Patient aware of signs/symptoms requiring further/urgent evaluation.   - amoxicillin-clavulanate (AUGMENTIN) 875-125 MG tablet; Take 1 tablet by mouth 2 (two) times daily for 10 days.  Dispense: 20 tablet; Refill: 0 - predniSONE (DELTASONE) 20 MG tablet; Take 2 tablets (40 mg total) by  mouth daily with breakfast for 5 days.  Dispense: 10 tablet; Refill: 0 - chlorpheniramine-HYDROcodone 10-8 MG/5ML; Take 5 mLs by mouth every 12 (twelve) hours as needed for up to 4 days for cough (cough, will cause drowsiness.).  Dispense: 40 mL; Refill: 0   Follow-up if symptoms worsen or fail to improve.   Terrilyn Saver, NP

## 2021-12-03 ENCOUNTER — Encounter: Payer: Self-pay | Admitting: Family Medicine

## 2021-12-03 ENCOUNTER — Ambulatory Visit (HOSPITAL_BASED_OUTPATIENT_CLINIC_OR_DEPARTMENT_OTHER)
Admission: RE | Admit: 2021-12-03 | Discharge: 2021-12-03 | Disposition: A | Discharge status: Home / Self Care | Payer: 59 | Source: Ambulatory Visit | Attending: Family Medicine | Admitting: Family Medicine

## 2021-12-03 ENCOUNTER — Other Ambulatory Visit: Payer: Self-pay

## 2021-12-03 ENCOUNTER — Ambulatory Visit: Payer: 59 | Admitting: Family Medicine

## 2021-12-03 VITALS — BP 137/49 | HR 86 | Temp 98.1°F | Ht 65.0 in | Wt 162.4 lb

## 2021-12-03 DIAGNOSIS — J40 Bronchitis, not specified as acute or chronic: Secondary | ICD-10-CM

## 2021-12-03 DIAGNOSIS — R0609 Other forms of dyspnea: Secondary | ICD-10-CM

## 2021-12-03 DIAGNOSIS — J329 Chronic sinusitis, unspecified: Secondary | ICD-10-CM

## 2021-12-03 DIAGNOSIS — R051 Acute cough: Secondary | ICD-10-CM | POA: Insufficient documentation

## 2021-12-03 MED ORDER — BENZONATATE 100 MG PO CAPS: ORAL_CAPSULE | ORAL | 0 refills | Status: DC

## 2021-12-03 MED ORDER — DOXYCYCLINE HYCLATE 100 MG PO TABS: 100.0000 mg | ORAL_TABLET | Freq: Two times a day (BID) | ORAL | 0 refills | Status: AC

## 2021-12-03 MED ORDER — HYDROCOD POLI-CHLORPHE POLI ER 10-8 MG/5ML PO SUER: 5.0000 mL | Freq: Two times a day (BID) | ORAL | 0 refills | Status: AC | PRN

## 2021-12-03 MED ORDER — FLUTICASONE PROPIONATE HFA 44 MCG/ACT IN AERO: 2.0000 | INHALATION_SPRAY | Freq: Two times a day (BID) | RESPIRATORY_TRACT | 12 refills | Status: DC

## 2021-12-03 NOTE — Patient Instructions (Signed)
Chest xray Changing antibiotics Cough medicine and inhaler sent in  Continue supportive measures including rest, hydration, humidifier use, steam showers, warm compresses to sinuses, warm liquids with lemon and honey, and over-the-counter cough, cold, and analgesics as needed.

## 2021-12-03 NOTE — Progress Notes (Signed)
Acute Office Visit  Subjective:    Patient ID: Katrina Ford, female    DOB: 06-May-1961, 61 y.o.   MRN: [id number]  Chief Complaint  Patient presents with   Cough    HPI Patient is in today for ongoing cough.   Patient was seen 11/25/21 for post-COVID symptoms leading to sinobronchitis. She was given Augmentin, Prednisone and cough syrup at that time. States she hasn't noticed much improvement other than cough being slightly less violent. She continues to have nonproductive cough (chest/back/abdomen now sore from coughing so much), difficulty sleeping, fatigue, body aches, frontal headaches. She doesn't feel like the antibiotics and steroids did anything. She has been using her albuterol inhaler 2-3 times a day for dyspnea on exertion and coughing fits. She denies any fevers, vomiting, diarrhea, GU symptoms. She has been doing all OTC recommendations without improving.     Past Medical History:  Diagnosis Date   Chronic headache    Depression    Elevated hemoglobin A1c    pre diabetic   Family history of malignant neoplasm of gastrointestinal tract    Frozen shoulder syndrome    Gastritis    GERD (gastroesophageal reflux disease)    Hiatal hernia    IBS (irritable bowel syndrome)    Kidney stones    Sciatic leg pain    May 2018   Sjogren's disease [facility])    Sjogren's syndrome ([place]) 06/27/2018   With inflammatory arthritis. [facility] Rheumatology Dr. Trudie Reed     Past Surgical History:  Procedure Laterality Date   CHOLECYSTECTOMY  2000   ESOPHAGEAL MANOMETRY  11/28/2012   Procedure: ESOPHAGEAL MANOMETRY (EM);  Surgeon: Sable Feil, MD;  Location: [facility];  Service: Endoscopy;  Laterality: N/A;   FOOT SURGERY Bilateral 03/2013   LITHOTRIPSY  2003&2012   TONSILLECTOMY      Family History  Problem Relation Age of Onset   Diabetes Mother    Heart disease Mother    Diverticulitis Mother    Hyperlipidemia Mother    Hyperlipidemia Father    Cancer Father         skin cancer   Fibromyalgia Cousin    Fibromyalgia Cousin    Fibromyalgia Cousin    Lupus Cousin    Lupus Cousin    Colon cancer Maternal Uncle 12       Passed away in 2009/02/06   Heart disease Paternal Grandmother        Siblings also   Hyperlipidemia Brother    Hyperlipidemia Sister    Breast cancer Paternal Aunt    Diabetes Maternal Uncle    Heart disease Maternal Uncle     Social History   Socioeconomic History   Marital status: Married    Spouse name: Not on file   Number of children: 2   Years of education: Not on file   Highest education level: Not on file  Occupational History   Not on file  Tobacco Use   Smoking status: Never   Smokeless tobacco: Never  Vaping Use   Vaping Use: Never used  Substance and Sexual Activity   Alcohol use: No   Drug use: No   Sexual activity: Yes    Birth control/protection: None  Other Topics Concern   Not on file  Social History Narrative   Not on file   Social Determinants of Health   Financial Resource Strain: Not on file  Food Insecurity: Not on file  Transportation Needs: Not on file  Physical Activity: Not on  file  Stress: Not on file  Social Connections: Not on file  Intimate Partner Violence: Not on file    Outpatient Medications Prior to Visit  Medication Sig Dispense Refill   albuterol (VENTOLIN HFA) 108 (90 Base) MCG/ACT inhaler INHALE 2 PUFFS INTO THE LUNGS EVERY 6 HOURS AS NEEDED FOR WHEEZING OR SHORTNESS OF BREATH 20.1 g 0   celecoxib (CELEBREX) 200 MG capsule Take 200 mg by mouth daily.     Cholecalciferol (VITAMIN D3) 3000 units TABS Take by mouth daily.      Cyanocobalamin (B-12 PO) Take 5,000 Units by mouth daily.     cyclobenzaprine (FLEXERIL) 10 MG tablet Take 1 tablet (10 mg total) by mouth 2 (two) times daily as needed for muscle spasms. 30 tablet 0   folic acid (FOLVITE) 1 MG tablet Take 2 mg by mouth daily.   6   hydroxychloroquine (PLAQUENIL) 200 MG tablet 400 mg daily.      methotrexate  (RHEUMATREX) 2.5 MG tablet TK 6 TS PO 1 TIME A WK  6   pantoprazole (PROTONIX) 40 MG tablet Take 1 tablet (40 mg total) by mouth daily. 90 tablet 3   traZODone (DESYREL) 50 MG tablet Take 0.5-1 tablets (25-50 mg total) by mouth at bedtime as needed. for sleep 90 tablet 3   amoxicillin-clavulanate (AUGMENTIN) 875-125 MG tablet Take 1 tablet by mouth 2 (two) times daily for 10 days. 20 tablet 0   benzonatate (TESSALON PERLES) 100 MG capsule 1-2 capsules twice daily as needed for cough 30 capsule 0   No facility-administered medications prior to visit.    No Known Allergies  Review of Systems All review of systems negative except what is listed in the HPI     Objective:    Physical Exam Vitals reviewed.  Constitutional:      Appearance: Normal appearance.  HENT:     Head: Normocephalic and atraumatic.  Cardiovascular:     Rate and Rhythm: Normal rate and regular rhythm.  Pulmonary:     Effort: Pulmonary effort is normal.     Comments: Diminished bases Skin:    General: Skin is warm and dry.  Neurological:     General: No focal deficit present.     Mental Status: She is alert and oriented to person, place, and time. Mental status is at baseline.  Psychiatric:        Mood and Affect: Mood normal.        Behavior: Behavior normal.        Thought Content: Thought content normal.        Judgment: Judgment normal.    BP (!) 137/49    Pulse 86    Temp 98.1 F (36.7 C)    Ht 5\' 5"  (1.651 m)    Wt 162 lb 6.4 oz (73.7 kg)    SpO2 99%    BMI 27.02 kg/m  Wt Readings from Last 3 Encounters:  12/03/21 162 lb 6.4 oz (73.7 kg)  11/25/21 161 lb 9.6 oz (73.3 kg)  05/30/21 155 lb 12.8 oz (70.7 kg)    Health Maintenance Due  Topic Date Due   Zoster Vaccines- Shingrix (1 of 2) Never done   PAP SMEAR-Modifier  03/02/2021   MAMMOGRAM  05/25/2021   TETANUS/TDAP  08/14/2021   COVID-19 Vaccine (4 - Booster for Pfizer series) 10/28/2021    There are no preventive care reminders to display  for this patient.   Lab Results  Component Value Date   TSH 1.72 09/19/2020  Lab Results  Component Value Date   WBC 5.0 01/13/2018   HGB 14.4 01/13/2018   HCT 42.1 01/13/2018   MCV 94.0 01/13/2018   PLT 267.0 01/13/2018   Lab Results  Component Value Date   NA 141 01/13/2018   K 4.0 01/13/2018   CO2 32 01/13/2018   GLUCOSE 97 01/13/2018   BUN 13 01/13/2018   CREATININE 0.85 01/13/2018   BILITOT 0.5 01/13/2018   ALKPHOS 59 01/13/2018   AST 14 01/13/2018   ALT 12 01/13/2018   PROT 6.9 01/13/2018   ALBUMIN 4.7 01/13/2018   CALCIUM 9.7 01/13/2018   GFR 73.32 01/13/2018   Lab Results  Component Value Date   CHOL 169 09/19/2020   Lab Results  Component Value Date   HDL 56.90 09/19/2020   Lab Results  Component Value Date   LDLCALC 93 09/19/2020   Lab Results  Component Value Date   TRIG 98.0 09/19/2020   Lab Results  Component Value Date   CHOLHDL 3 09/19/2020   Lab Results  Component Value Date   HGBA1C 5.7 09/19/2020       Assessment & Plan:   1. Acute cough 2. Dyspnea on exertion 3. Sinobronchitis Chest xray Changing antibiotics to doxy since she didn't respond to Augmentin Cough medicine and inhaler sent in  Continue supportive measures including rest, hydration, humidifier use, steam showers, warm compresses to sinuses, warm liquids with lemon and honey, and over-the-counter cough, cold, and analgesics as needed.   - fluticasone (FLOVENT HFA) 44 MCG/ACT inhaler; Inhale 2 puffs into the lungs in the morning and at bedtime.  Dispense: 1 each; Refill: 12 - benzonatate (TESSALON PERLES) 100 MG capsule; 1-2 capsules twice daily as needed for cough  Dispense: 30 capsule; Refill: 0 - chlorpheniramine-HYDROcodone 10-8 MG/5ML; Take 5 mLs by mouth every 12 (twelve) hours as needed for up to 5 days for cough (cough, will cause drowsiness.).  Dispense: 50 mL; Refill: 0 - doxycycline (VIBRA-TABS) 100 MG tablet; Take 1 tablet (100 mg total) by mouth 2 (two)  times daily for 7 days.  Dispense: 14 tablet; Refill: 0 - DG Chest 2 View; Future    Follow-up if symptoms worsen or fail to improve.    Terrilyn Saver, NP

## 2021-12-12 LAB — HM DEXA SCAN

## 2021-12-12 LAB — HM MAMMOGRAPHY

## 2022-06-04 NOTE — Patient Instructions (Addendum)
Good to see you again today - I will be in touch with your labs asap  Try taking 100 mg of trazodone for sleep and let me know how this works for you- if helpful I am glad to rx the 100 mg strength for you. If not helpful taking progesterone may be helpful- would be a good question for Dr Ronita Hipps I put in a referral for you to see Dr Hilarie Fredrickson for your colonoscopy Keep up the good work with exercise!  Adding some strength training can also be helpful 2nd dose of shingrix due in 2-6 months  Tetanus Shingirx #1 Prevnar 20/ pneumonia given today

## 2022-06-04 NOTE — Progress Notes (Addendum)
[place] [facility] [address], [place], [place] [address] [phone]  Date:  06/10/2022   Name:  Katrina Ford   DOB:  [birth year]   MRN:  [id number]  PCP:  Darreld Mclean, MD    Chief Complaint: Annual Exam (Concerns/ questions: 1. Insomnia is still an issue. 2. Update vaccines. /Pap/ Mamm: sees GYN)   History of Present Illness:  Katrina Ford is a 61 y.o. very pleasant female patient who presents with the following:  Pt seen today for a CPE Most recent visit with myself about a year ago for elbow pain .  She notes she may get random pains off and on, but her elbow is ok now   History of OSA, GERD, sjogren's syndrome, polyarthrlagia, pre-diabetes, depression, FBM, kidney stones Does not use CPAP due to worsening of her dry eyes  GYN care per Dr. Ronita Hipps; appt this month.  Mammo is done per [facility] and up-to-date per her report [facility] rheum/ Dr. 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  This is reasonable given her use of Plaquenil and methotrexate Shingrix- can start today  Pap is due- will see [place]  Mammogram- done in Feb per Dr Franchot Gallo- booster today  Most recent labs 2019 except lipids and vit D, 5.7, TSH 11/21  She has noted more difficulty sleeping- she is using trazodone 25 to 50 mg She notes she is getting to sleep ok but then will tend to wake up around 3-4 am and can't get back to sleep  She has noted waking up hot a lot during the night over the last several  months   Patient Active Problem List   Diagnosis Date Noted   Prediabetes 09/18/2020   Fibromyalgia 10/10/2018   Sjogren's syndrome ([place]) 06/27/2018   Urine incontinence 10/03/2015   Polyarthralgia 08/19/2015   OSA (obstructive sleep apnea) 08/19/2015   Physical exam 08/14/2014   Menopausal vaginal dryness 08/14/2014   Depression 01/05/2014   Abdominal pain, RLQ (right lower quadrant) 10/05/2011   Abdominal pain 10/01/2011   Constipation, slow transit 10/01/2011   Abnormal liver enzymes 10/01/2011   IRRITABLE BOWEL SYNDROME 07/18/2010   GERD 12/01/2007   HIATAL HERNIA 12/01/2007   HEADACHE, CHRONIC 12/01/2007   GASTRITIS 07/27/2000    Past Medical History:  Diagnosis Date   Chronic headache    Depression    Elevated hemoglobin A1c    pre diabetic   Family history of malignant neoplasm of gastrointestinal tract    Frozen shoulder syndrome    Gastritis    GERD (gastroesophageal reflux disease)    Hiatal hernia    IBS (irritable bowel syndrome)    Kidney stones    Sciatic leg pain    May 2018   Sjogren's disease ([place])    Sjogren's syndrome ([place]) 06/27/2018   With inflammatory arthritis. [facility] Rheumatology Dr. Trudie Reed     Past Surgical History:  Procedure Laterality Date   CHOLECYSTECTOMY  2000   ESOPHAGEAL MANOMETRY  11/28/2012   Procedure: ESOPHAGEAL MANOMETRY (EM);  Surgeon: Sable Feil, MD;  [facility]: [facility];  Service: Endoscopy;  Laterality: N/A;   FOOT SURGERY Bilateral 03/2013   LITHOTRIPSY  2003&2012   TONSILLECTOMY      Social History   Tobacco Use   Smoking status: Never   Smokeless tobacco: Never  Vaping Use   Vaping Use: Never used  Substance Use Topics   Alcohol use: No   Drug use: No    Family History  Problem Relation Age of Onset   Diabetes Mother    Heart disease Mother    Diverticulitis Mother    Hyperlipidemia Mother    Hyperlipidemia Father    Cancer Father        skin cancer   Fibromyalgia Cousin     Fibromyalgia Cousin    Fibromyalgia Cousin    Lupus Cousin    Lupus Cousin    Colon cancer Maternal Uncle 48       Passed away in Feb 03, 2009   Heart disease Paternal Grandmother        Siblings also   Hyperlipidemia Brother    Hyperlipidemia Sister    Breast cancer Paternal Aunt    Diabetes Maternal Uncle    Heart disease Maternal Uncle     No Known Allergies  Medication list has been reviewed and updated.  Current Outpatient Medications on File Prior to Visit  Medication Sig Dispense Refill   celecoxib (CELEBREX) 200 MG capsule Take 200 mg by mouth daily.     Cholecalciferol (VITAMIN D3) 3000 units TABS Take by mouth daily.      cyclobenzaprine (FLEXERIL) 10 MG tablet Take 1 tablet (10 mg total) by mouth 2 (two) times daily as needed for muscle spasms. 30 tablet 0   folic acid (FOLVITE) 1 MG tablet Take 2 mg by mouth daily.   6   hydroxychloroquine (PLAQUENIL) 200 MG tablet 400 mg daily.      methotrexate (RHEUMATREX) 2.5 MG tablet TK 6 TS PO 1 TIME A WK  6   pantoprazole (PROTONIX) 40 MG tablet Take 1 tablet (40 mg total) by mouth daily. 90 tablet 3   traZODone (DESYREL) 50 MG tablet Take 0.5-1 tablets (25-50 mg total) by mouth at bedtime as needed. for sleep 90 tablet 3   No current facility-administered medications on file prior to visit.    Review of Systems:  As per HPI- otherwise negative.   Physical Examination: Vitals:   06/10/22 0914  BP: 122/76  Pulse: 86  Resp: 18  Temp: 97.9 F (36.6 C)   Vitals:   06/10/22 0914  Weight: 152 lb 12.8 oz (69.3 kg)  Height: '5\' 5"'$  (1.651 m)   Body mass index is 25.43 kg/m. Ideal Body Weight: Weight in (lb) to have BMI = 25: 149.9  GEN: no acute distress. Normal weight, looks well  HEENT: Atraumatic, Normocephalic.  Ears and Nose: No external deformity. CV: RRR, No M/G/R. No JVD. No thrill. No extra heart sounds. PULM: CTA B, no wheezes, crackles, rhonchi. No retractions. No resp. distress. No accessory muscle use. ABD:  S, NT, ND, +BS. No rebound. No HSM. EXTR: No c/c/e PSYCH: Normally interactive. Conversant.    Assessment and Plan: Physical exam  Screening for hyperlipidemia - Plan: Lipid panel  Screening for thyroid disorder - Plan: TSH  Sjogren's syndrome, with unspecified organ involvement ([place])  Prediabetes - Plan: Comprehensive metabolic panel, Hemoglobin A1c  Screening for deficiency anemia -  Plan: CBC  Fatigue, unspecified type - Plan: TSH, VITAMIN D 25 Hydroxy (Vit-D Deficiency, Fractures)  Gastroesophageal reflux disease, unspecified whether esophagitis present - Plan: pantoprazole (PROTONIX) 40 MG tablet  Immunization due - Plan: Td vaccine greater than or equal to 7yo preservative free IM, Zoster Recombinant (Shingrix ), Pneumococcal conjugate vaccine 20-valent (Prevnar 20)  Screening for malignant neoplasm of colon - Plan: Ambulatory referral to Gastroenterology  Physical exam today- Will plan further follow- up pending labs. Update tetanus, give Prevnar 20, Shingrix No. 1 Patient notes she is a bit overdue for screening colon, placed referral to gastroenterology.  Refilled her pantoprazole She we will try taking trazodone 100 mg at bedtime for sleep, she will let me know how this works for her Will plan further follow- up pending labs.   Signed Lamar Blinks, MD Received labs as below, message to patient Results for orders placed or performed in visit on 06/10/22  CBC  Result Value Ref Range   WBC 6.3 4.0 - 10.5 K/uL   RBC 4.45 3.87 - 5.11 Mil/uL   Platelets 249.0 150.0 - 400.0 K/uL   Hemoglobin 14.1 12.0 - 15.0 g/dL   HCT 41.6 36.0 - 46.0 %   MCV 93.6 78.0 - 100.0 fl   MCHC 33.9 30.0 - 36.0 g/dL   RDW 13.4 11.5 - 15.5 %  Comprehensive metabolic panel  Result Value Ref Range   Sodium 142 135 - 145 mEq/L   Potassium 4.5 3.5 - 5.1 mEq/L   Chloride 103 96 - 112 mEq/L   CO2 32 19 - 32 mEq/L   Glucose, Bld 101 (H) 70 - 99 mg/dL   BUN 14 6 - 23 mg/dL   Creatinine,  Ser 0.72 0.40 - 1.20 mg/dL   Total Bilirubin 0.5 0.2 - 1.2 mg/dL   Alkaline Phosphatase 68 39 - 117 U/L   AST 14 0 - 37 U/L   ALT 12 0 - 35 U/L   Total Protein 6.9 6.0 - 8.3 g/dL   Albumin 4.8 3.5 - 5.2 g/dL   GFR 90.37 >60.00 mL/min   Calcium 9.5 8.4 - 10.5 mg/dL  Hemoglobin A1c  Result Value Ref Range   Hgb A1c MFr Bld 6.1 4.6 - 6.5 %  Lipid panel  Result Value Ref Range   Cholesterol 173 0 - 200 mg/dL   Triglycerides 80.0 0.0 - 149.0 mg/dL   HDL 57.90 >39.00 mg/dL   VLDL 16.0 0.0 - 40.0 mg/dL   LDL Cholesterol 99 0 - 99 mg/dL   Total CHOL/HDL Ratio 3    NonHDL 114.67   TSH  Result Value Ref Range   TSH 2.59 0.35 - 5.50 uIU/mL  VITAMIN D 25 Hydroxy (Vit-D Deficiency, Fractures)  Result Value Ref Range   VITD 38.68 30.00 - 100.00 ng/mL

## 2022-06-10 ENCOUNTER — Encounter: Payer: Self-pay | Admitting: Family Medicine

## 2022-06-10 ENCOUNTER — Ambulatory Visit (INDEPENDENT_AMBULATORY_CARE_PROVIDER_SITE_OTHER): Payer: 59 | Admitting: Family Medicine

## 2022-06-10 VITALS — BP 122/76 | HR 86 | Temp 97.9°F | Resp 18 | Ht 65.0 in | Wt 152.8 lb

## 2022-06-10 DIAGNOSIS — M3500 Sicca syndrome, unspecified: Secondary | ICD-10-CM | POA: Diagnosis not present

## 2022-06-10 DIAGNOSIS — K219 Gastro-esophageal reflux disease without esophagitis: Secondary | ICD-10-CM

## 2022-06-10 DIAGNOSIS — R5383 Other fatigue: Secondary | ICD-10-CM | POA: Diagnosis not present

## 2022-06-10 DIAGNOSIS — R7303 Prediabetes: Secondary | ICD-10-CM

## 2022-06-10 DIAGNOSIS — Z0000 Encounter for general adult medical examination without abnormal findings: Secondary | ICD-10-CM | POA: Diagnosis not present

## 2022-06-10 DIAGNOSIS — Z13220 Encounter for screening for lipoid disorders: Secondary | ICD-10-CM | POA: Diagnosis not present

## 2022-06-10 DIAGNOSIS — Z1211 Encounter for screening for malignant neoplasm of colon: Secondary | ICD-10-CM

## 2022-06-10 DIAGNOSIS — Z1329 Encounter for screening for other suspected endocrine disorder: Secondary | ICD-10-CM | POA: Diagnosis not present

## 2022-06-10 DIAGNOSIS — Z23 Encounter for immunization: Secondary | ICD-10-CM | POA: Diagnosis not present

## 2022-06-10 DIAGNOSIS — Z130 Encounter for screening for diseases of the blood and blood-forming organs and certain disorders involving the immune mechanism: Secondary | ICD-10-CM | POA: Diagnosis not present

## 2022-06-10 LAB — VITAMIN D 25 HYDROXY (VIT D DEFICIENCY, FRACTURES): VITD: 38.68 ng/mL (ref 30.00–100.00)

## 2022-06-10 LAB — LIPID PANEL
Cholesterol: 173 mg/dL (ref 0–200)
HDL: 57.9 mg/dL (ref >39.00)
LDL Cholesterol: 99 mg/dL (ref 0–99)
NonHDL: 114.67
Total CHOL/HDL Ratio: 3
Triglycerides: 80 mg/dL (ref 0.0–149.0)
VLDL: 16 mg/dL (ref 0.0–40.0)

## 2022-06-10 LAB — TSH: TSH: 2.59 u[IU]/mL (ref 0.35–5.50)

## 2022-06-10 LAB — CBC
HCT: 41.6 % (ref 36.0–46.0)
Hemoglobin: 14.1 g/dL (ref 12.0–15.0)
MCHC: 33.9 g/dL (ref 30.0–36.0)
MCV: 93.6 fl (ref 78.0–100.0)
Platelets: 249 10*3/uL (ref 150.0–400.0)
RBC: 4.45 Mil/uL (ref 3.87–5.11)
RDW: 13.4 % (ref 11.5–15.5)
WBC: 6.3 10*3/uL (ref 4.0–10.5)

## 2022-06-10 LAB — COMPREHENSIVE METABOLIC PANEL
ALT: 12 U/L (ref 0–35)
AST: 14 U/L (ref 0–37)
Albumin: 4.8 g/dL (ref 3.5–5.2)
Alkaline Phosphatase: 68 U/L (ref 39–117)
BUN: 14 mg/dL (ref 6–23)
CO2: 32 mEq/L (ref 19–32)
Calcium: 9.5 mg/dL (ref 8.4–10.5)
Chloride: 103 mEq/L (ref 96–112)
Creatinine, Ser: 0.72 mg/dL (ref 0.40–1.20)
GFR: 90.37 mL/min (ref >60.00)
Glucose, Bld: 101 mg/dL — ABNORMAL HIGH (ref 70–99)
Potassium: 4.5 mEq/L (ref 3.5–5.1)
Sodium: 142 mEq/L (ref 135–145)
Total Bilirubin: 0.5 mg/dL (ref 0.2–1.2)
Total Protein: 6.9 g/dL (ref 6.0–8.3)

## 2022-06-10 LAB — HEMOGLOBIN A1C: Hgb A1c MFr Bld: 6.1 % (ref 4.6–6.5)

## 2022-06-10 MED ORDER — PANTOPRAZOLE SODIUM 40 MG PO TBEC: 40.0000 mg | DELAYED_RELEASE_TABLET | Freq: Every day | ORAL | 3 refills | Status: DC

## 2022-06-12 IMAGING — CR DG CHEST 2V
2 series · 2 of 2 positions shown · non-contrast
Comparison: None.

CLINICAL DATA: Cough for 3 weeks, fatigue, recent COVID

EXAM:
CHEST - 2 VIEW

[w chest pa]
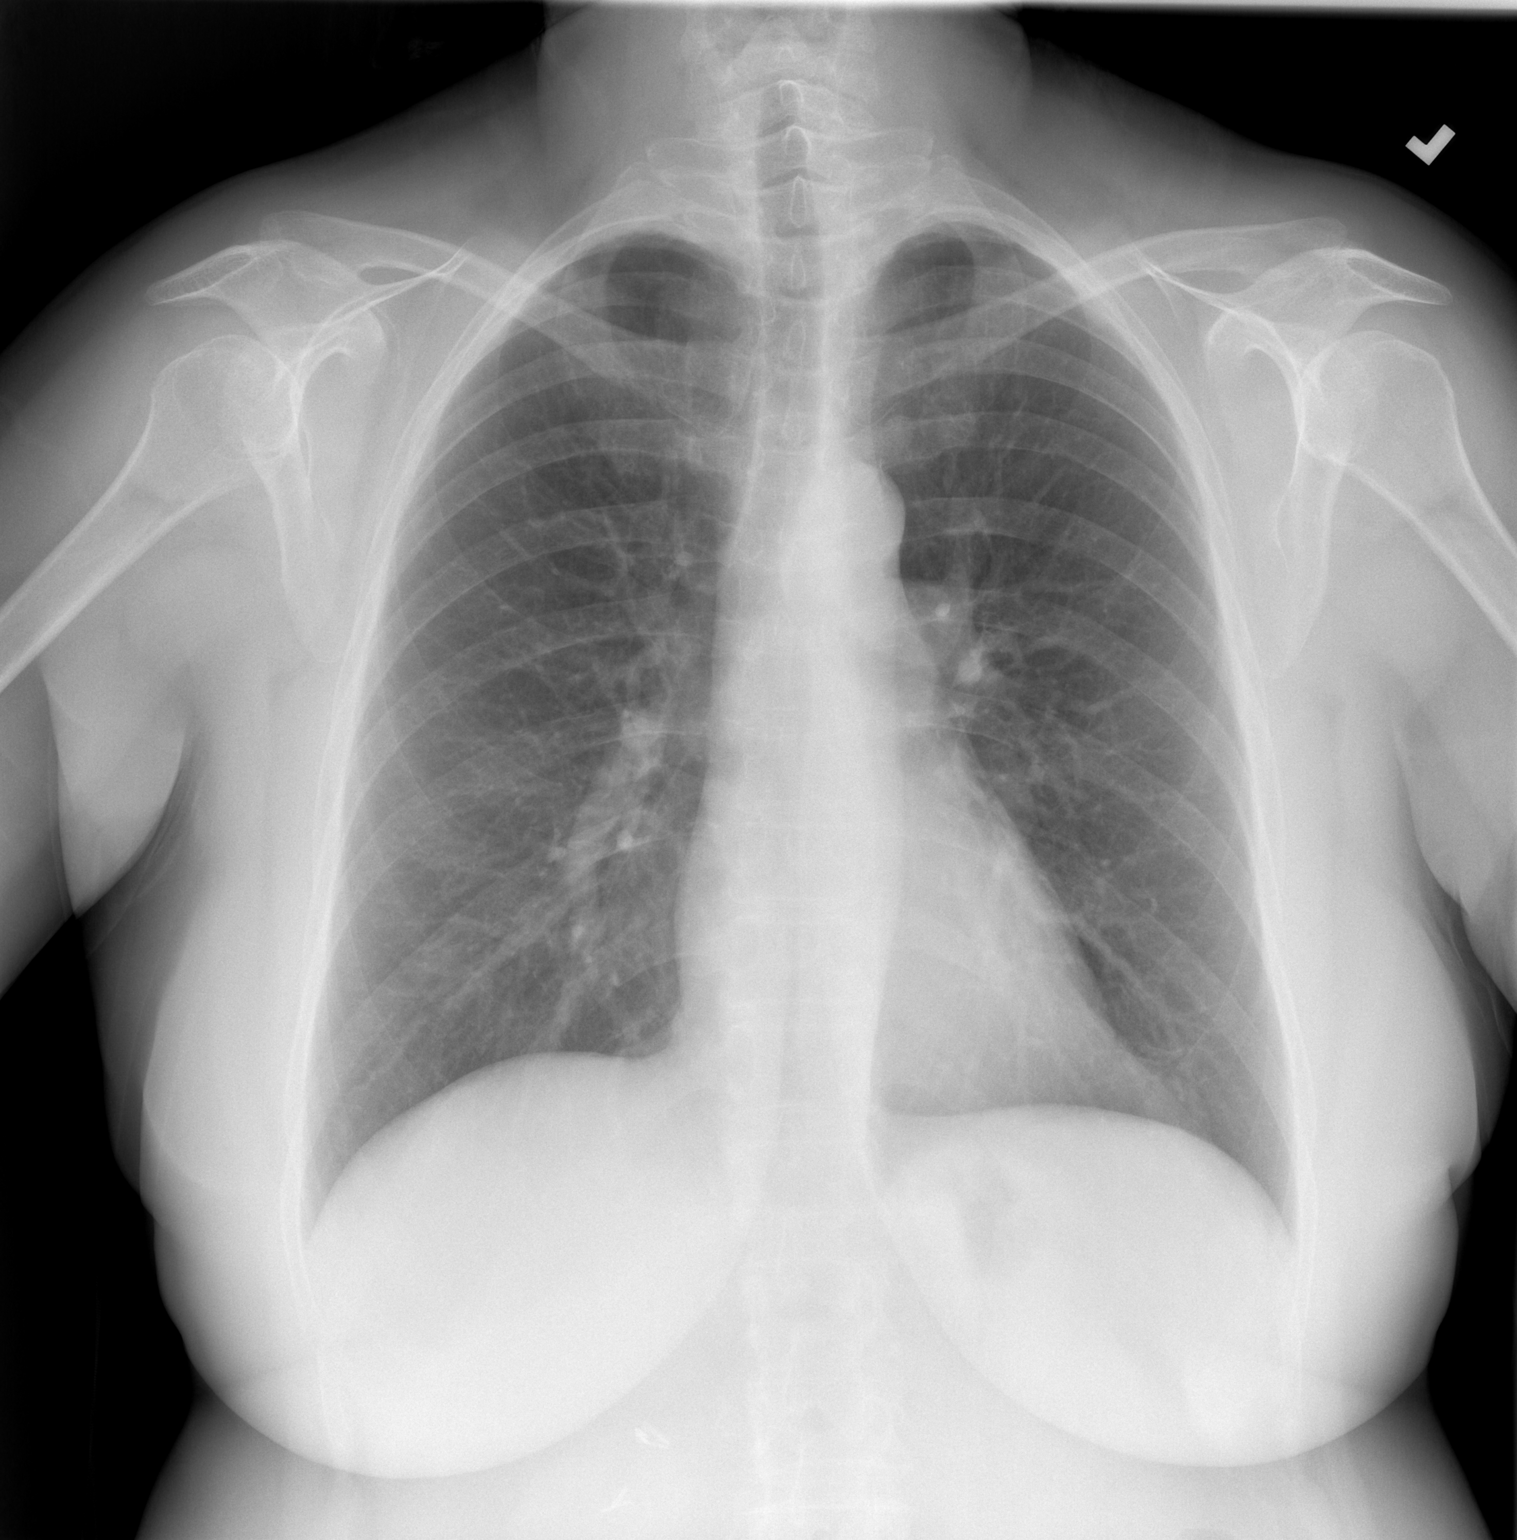

[w chest lat]
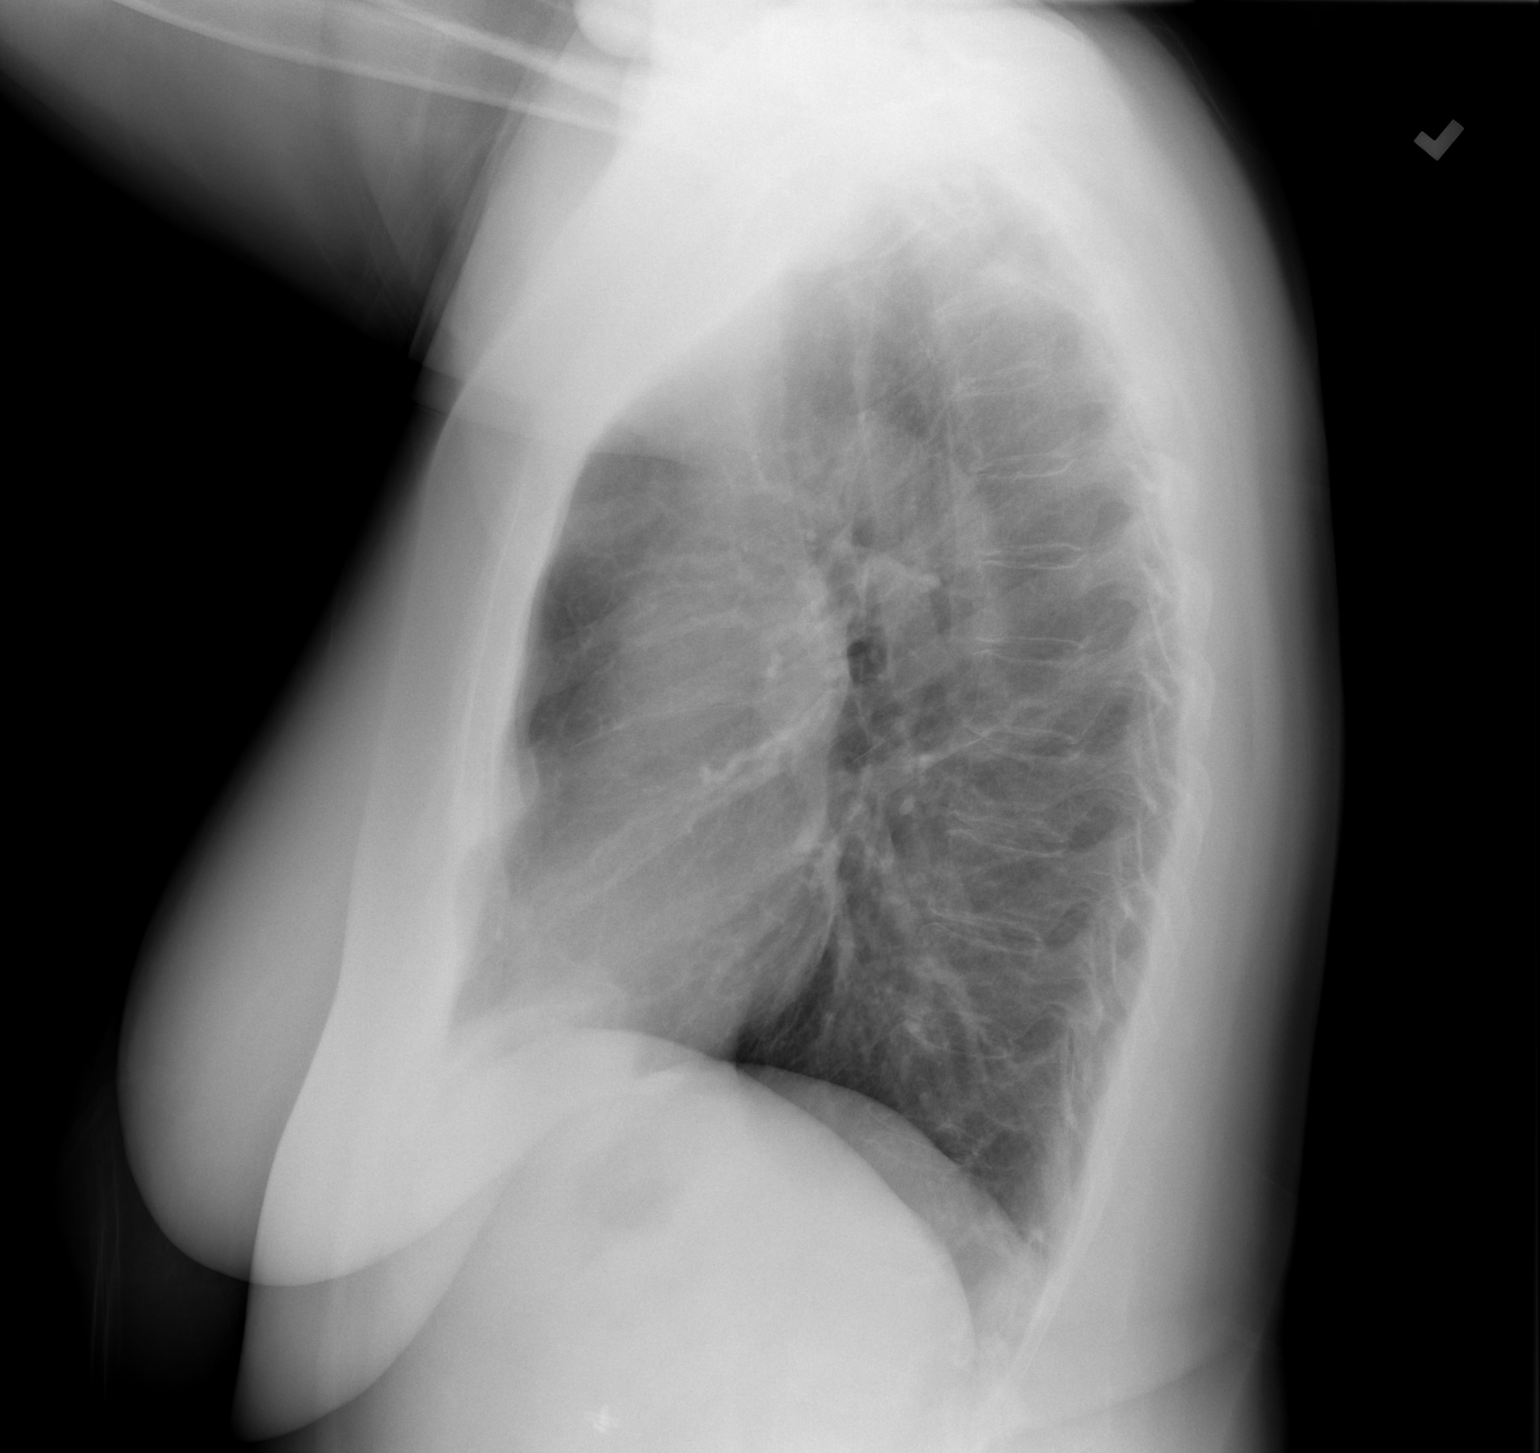

[2 of 2 positions shown; findings below may reference images not displayed]

FINDINGS: The heart size and mediastinal contours are within normal limits.
Both lungs are clear. The visualized skeletal structures are
unremarkable.
IMPRESSION: No active cardiopulmonary disease.

## 2022-07-10 LAB — RESULTS CONSOLE HPV: CHL HPV: NEGATIVE

## 2022-07-10 LAB — HM PAP SMEAR: HM Pap smear: NORMAL

## 2022-07-20 ENCOUNTER — Encounter: Payer: Self-pay | Admitting: Family Medicine

## 2022-07-20 DIAGNOSIS — M810 Age-related osteoporosis without current pathological fracture: Secondary | ICD-10-CM | POA: Insufficient documentation

## 2022-08-11 ENCOUNTER — Encounter: Payer: Self-pay | Admitting: Internal Medicine

## 2022-08-31 ENCOUNTER — Other Ambulatory Visit: Payer: Self-pay

## 2022-08-31 DIAGNOSIS — K219 Gastro-esophageal reflux disease without esophagitis: Secondary | ICD-10-CM

## 2022-08-31 MED ORDER — PANTOPRAZOLE SODIUM 40 MG PO TBEC: 40.0000 mg | DELAYED_RELEASE_TABLET | Freq: Every day | ORAL | 3 refills | Status: DC

## 2022-09-09 ENCOUNTER — Telehealth: Payer: Self-pay | Admitting: Family Medicine

## 2022-09-09 DIAGNOSIS — R7303 Prediabetes: Secondary | ICD-10-CM

## 2022-09-09 NOTE — Telephone Encounter (Signed)
Patient called to get scheduled for A1C. She thought it should have been around this time. Advised note in August was for 6 months. Please confirm when she should get her A1C rechecked and get her shingles shot and call patient to advise.

## 2022-09-10 NOTE — Telephone Encounter (Signed)
Looks like this is correct. Can you double check before I let the pt know.

## 2022-09-10 NOTE — Telephone Encounter (Signed)
She can do anytime between now and December/ January  Thank you!

## 2022-09-10 NOTE — Telephone Encounter (Signed)
See below

## 2022-09-11 ENCOUNTER — Telehealth: Payer: Self-pay | Admitting: Family Medicine

## 2022-09-11 NOTE — Telephone Encounter (Signed)
Patient called to follow up on status of call regarding vaccinations. Reviewed notes and was in the process of letting the patient know that we could get her scheduled for her A1C between Dec/Jan, but patient said she wanted to get scheduled for flu, shingles and covid vaccines. Advised patient we do not do the covid boosters here so it will need to be done at the pharmacy. Patient cut me off and said she wasn't told that when she called before. Advised the only thing mentioned when she called the first time was when she should get the A1C test and the shingles shot. Patient said then someone didn't pass on the entire message and she would just get them all done at the pharmacy. Patient was asked if she still wanted to schedule the A1C test and patient said she would send a message to provider because she wasn't told this and hung up the phone.

## 2022-09-12 ENCOUNTER — Encounter: Payer: Self-pay | Admitting: Family Medicine

## 2022-09-14 NOTE — Telephone Encounter (Signed)
Message that was documented in the chart per SP as of 09/11/22: (everything that she was told from front staff in the message is correct)   "Patient called to follow up on status of call regarding vaccinations. Reviewed notes and was in the process of letting the patient know that we could get her scheduled for her A1C between Dec/Jan, but patient said she wanted to get scheduled for flu, shingles and covid vaccines. Advised patient we do not do the covid boosters here so it will need to be done at the pharmacy. Patient cut me off and said she wasn't told that when she called before. Advised the only thing mentioned when she called the first time was when she should get the A1C test and the shingles shot. Patient said then someone didn't pass on the entire message and she would just get them all done at the pharmacy. Patient was asked if she still wanted to schedule the A1C test and patient said she would send a message to provider because she wasn't told this and hung up the phone."

## 2022-09-17 ENCOUNTER — Ambulatory Visit (AMBULATORY_SURGERY_CENTER): Payer: Self-pay | Admitting: *Deleted

## 2022-09-17 ENCOUNTER — Other Ambulatory Visit: Payer: Self-pay

## 2022-09-17 VITALS — Ht 65.0 in | Wt 159.0 lb

## 2022-09-17 DIAGNOSIS — Z1211 Encounter for screening for malignant neoplasm of colon: Secondary | ICD-10-CM

## 2022-09-17 MED ORDER — NA SULFATE-K SULFATE-MG SULF 17.5-3.13-1.6 GM/177ML PO SOLN: 1.0000 | Freq: Once | ORAL | 0 refills | Status: AC

## 2022-09-17 MED ORDER — ONDANSETRON HCL 4 MG PO TABS: 4.0000 mg | ORAL_TABLET | Freq: Three times a day (TID) | ORAL | 0 refills | Status: DC | PRN

## 2022-09-17 NOTE — Progress Notes (Signed)
Pre visit completed in person.  No egg or soy allergy known to patient  No issues known to pt with past sedation with any surgeries or procedures Patient denies ever being told they had issues or difficulty with intubation  No FH of Malignant Hyperthermia Pt is not on diet pills Pt is not on  home 02  Pt is not on blood thinners  Pt denies issues with constipation  No A fib or A flutter  Pt instructed to use [email or web address].com or GoodRx for a price reduction on prep   

## 2022-10-04 ENCOUNTER — Encounter: Payer: Self-pay | Admitting: Certified Registered Nurse Anesthetist

## 2022-10-05 ENCOUNTER — Encounter: Payer: Self-pay | Admitting: Internal Medicine

## 2022-10-08 ENCOUNTER — Encounter: Payer: Self-pay | Admitting: Internal Medicine

## 2022-10-08 ENCOUNTER — Ambulatory Visit (AMBULATORY_SURGERY_CENTER): Payer: 59 | Admitting: Internal Medicine

## 2022-10-08 VITALS — BP 111/54 | HR 66 | Temp 96.8°F | Resp 13 | Ht 65.0 in | Wt 160.0 lb

## 2022-10-08 DIAGNOSIS — Z1211 Encounter for screening for malignant neoplasm of colon: Secondary | ICD-10-CM

## 2022-10-08 MED ORDER — SODIUM CHLORIDE 0.9 % IV SOLN: 500.0000 mL | Freq: Once | INTRAVENOUS | Status: DC

## 2022-10-08 NOTE — Op Note (Signed)
[facility] Patient Name: Katrina Ford Procedure Date: 10/08/2022 10:22 AM MRN: [id number] Endoscopist: Jerene Bears , MD, [id number] Age: 61 Referring MD:  Date of Birth: [birth year] Gender: Female Account #: [id number] Procedure:                Colonoscopy Indications:              Screening for colorectal malignant neoplasm, Last                            colonoscopy: 2012 Medicines:                Monitored Anesthesia Care Procedure:                Pre-Anesthesia Assessment:                           - Prior to the procedure, a History and Physical                            was performed, and patient medications and                            allergies were reviewed. The patient's tolerance of                            previous anesthesia was also reviewed. The risks                            and benefits of the procedure and the sedation                            options and risks were discussed with the patient.                            All questions were answered, and informed consent                            was obtained. Prior Anticoagulants: The patient has                            taken no anticoagulant or antiplatelet agents. ASA                            Grade Assessment: II - A patient with mild systemic                            disease. After reviewing the risks and benefits,                            the patient was deemed in satisfactory condition to                            undergo the procedure.  After obtaining informed consent, the colonoscope                            was passed under direct vision. Throughout the                            procedure, the patient's blood pressure, pulse, and                            oxygen saturations were monitored continuously. The                            CF [id number] #[id number] was introduced through the anus                            and advanced to the cecum, identified  by                            appendiceal orifice and ileocecal valve. The                            colonoscopy was performed without difficulty. The                            patient tolerated the procedure well. The quality                            of the bowel preparation was excellent. The                            ileocecal valve, appendiceal orifice, and rectum                            were photographed. Scope In: 10:38:31 AM Scope Out: 10:50:54 AM Scope Withdrawal Time: 0 hours 9 minutes 23 seconds  Total Procedure Duration: 0 hours 12 minutes 23 seconds  Findings:                 The digital rectal exam was normal.                           The entire examined colon appeared normal on direct                            and retroflexion views. Complications:            No immediate complications. Estimated Blood Loss:     Estimated blood loss: none. Impression:               - The entire examined colon is normal on direct and                            retroflexion views.                           - No specimens collected. Recommendation:           -  Patient has a contact number available for                            emergencies. The signs and symptoms of potential                            delayed complications were discussed with the                            patient. Return to normal activities tomorrow.                            Written discharge instructions were provided to the                            patient.                           - Resume previous diet.                           - Continue present medications. In setting of new                            diagnosis of osteoporosis, okay to try and change                            GERD medication from pantoprazole 40 mg daily to                            famotidine 20 mg twice daily (roughly 12 hours                            apart). If this is not effective contact my office.                            - Repeat colonoscopy in 10 years for screening                            purposes. Jerene Bears, MD 10/08/2022 10:53:59 AM This report has been signed electronically.

## 2022-10-08 NOTE — Progress Notes (Signed)
VS by CW  Pt's states no medical or surgical changes since previsit or office visit.  

## 2022-10-08 NOTE — Progress Notes (Signed)
GASTROENTEROLOGY PROCEDURE H&P NOTE   Primary Care Physician: Katrina Mclean, MD    Reason for Procedure:  Colon cancer screening  Plan:    Colonoscopy  Patient is appropriate for endoscopic procedure(s) in the ambulatory ([place]) setting.  The nature of the procedure, as well as the risks, benefits, and alternatives were carefully and thoroughly reviewed with the patient. Ample time for discussion and questions allowed. The patient understood, was satisfied, and agreed to proceed.     HPI: Katrina Ford is a 61 y.o. female who presents for colonoscopy.  Medical history as below.  Tolerated the prep.  No recent chest pain or shortness of breath.  No abdominal pain today.  Past Medical History:  Diagnosis Date   Chronic headache    Depression    Elevated hemoglobin A1c    pre diabetic   Family history of malignant neoplasm of gastrointestinal tract    Frozen shoulder syndrome    Gastritis    GERD (gastroesophageal reflux disease)    Hiatal hernia    IBS (irritable bowel syndrome)    Kidney stones    Osteoporosis    Sciatic leg pain    May 2018   Sjogren's disease [facility])    Sjogren's syndrome ([place]) 06/27/2018   With inflammatory arthritis. [facility]umat[facility] Dr. Trudie Ford    Sleep apnea     Past Surgical History:  Procedure Laterality Date   CHOLECYSTECTOMY  2000   ESOPHAGEAL MANOMETRY  11/28/2012   Procedure: ESOPHAGEAL MANOMETRY (EM);  Surgeon: Katrina Feil, MD;  Location: [facility];  Service: Endoscopy;  Laterality: N/A;   FOOT SURGERY Bilateral 03/2013   LITHOTRIPSY  2003&2012   TONSILLECTOMY      Prior to Admission medications   Medication Sig Start Date End Date Taking? Authorizing Provider  Cholecalciferol (VITAMIN D3) 3000 units TABS Take by mouth daily.    Yes [provider]  Estradiol 10 MCG TABS vaginal tablet  07/13/22  Yes [provider]  folic acid (FOLVITE) 1 MG tablet Take 2 mg by mouth daily.  11/06/16  Yes  [provider]  hydroxychloroquine (PLAQUENIL) 200 MG tablet 400 mg daily.  01/07/16  Yes [provider]  methotrexate (RHEUMATREX) 2.5 MG tablet TK 6 TS PO 1 TIME A WK 11/06/16  Yes [provider]  ondansetron (ZOFRAN) 4 MG tablet Take 1 tablet (4 mg total) by mouth every 8 (eight) hours as needed for nausea or vomiting. 09/17/22  Yes Katrina Ford, Katrina Lines, MD  pantoprazole (PROTONIX) 40 MG tablet Take 1 tablet (40 mg total) by mouth daily. 08/31/22  Yes Ford, Katrina Filler, MD  traZODone (DESYREL) 50 MG tablet Take 0.5-1 tablets (25-50 mg total) by mouth at bedtime as needed. for sleep 09/19/20  Yes Ford, Katrina Filler, MD  celecoxib (CELEBREX) 200 MG capsule Take 200 mg by mouth daily.    [provider]  cyclobenzaprine (FLEXERIL) 10 MG tablet Take 1 tablet (10 mg total) by mouth 2 (two) times daily as needed for muscle spasms. 05/30/21   Ford, Katrina Filler, MD    Current Outpatient Medications  Medication Sig Dispense Refill   Cholecalciferol (VITAMIN D3) 3000 units TABS Take by mouth daily.      Estradiol 10 MCG TABS vaginal tablet      folic acid (FOLVITE) 1 MG tablet Take 2 mg by mouth daily.   6   hydroxychloroquine (PLAQUENIL) 200 MG tablet 400 mg daily.      methotrexate (RHEUMATREX) 2.5 MG tablet  TK 6 TS PO 1 TIME A WK  6   ondansetron (ZOFRAN) 4 MG tablet Take 1 tablet (4 mg total) by mouth every 8 (eight) hours as needed for nausea or vomiting. 4 tablet 0   pantoprazole (PROTONIX) 40 MG tablet Take 1 tablet (40 mg total) by mouth daily. 90 tablet 3   traZODone (DESYREL) 50 MG tablet Take 0.5-1 tablets (25-50 mg total) by mouth at bedtime as needed. for sleep 90 tablet 3   celecoxib (CELEBREX) 200 MG capsule Take 200 mg by mouth daily.     cyclobenzaprine (FLEXERIL) 10 MG tablet Take 1 tablet (10 mg total) by mouth 2 (two) times daily as needed for muscle spasms. 30 tablet 0   Current Facility-Administered Medications  Medication Dose Route Frequency  Provider Last Rate Last Admin   0.9 %  sodium chloride infusion  500 mL Intravenous Once Katrina Ford, Katrina Lines, MD        Allergies as of 10/08/2022   (No Known Allergies)    Family History  Problem Relation Age of Onset   Diabetes Mother    Heart disease Mother    Diverticulitis Mother    Hyperlipidemia Mother    Hyperlipidemia Father    Cancer Father        skin cancer   Hyperlipidemia Sister    Hyperlipidemia Brother    Colon cancer Maternal Uncle 84       Passed away in Jan 16, 2009   Diabetes Maternal Uncle    Heart disease Maternal Uncle    Breast cancer Paternal Aunt    Colon cancer Paternal Uncle    Heart disease Paternal Grandmother        Siblings also   Fibromyalgia Cousin    Fibromyalgia Cousin    Fibromyalgia Cousin    Lupus Cousin    Lupus Cousin    Esophageal cancer Neg Hx    Stomach cancer Neg Hx    Rectal cancer Neg Hx     Social History   Socioeconomic History   Marital status: Married    Spouse name: Not on file   Number of children: 2   Years of education: Not on file   Highest education level: Not on file  Occupational History   Not on file  Tobacco Use   Smoking status: Never   Smokeless tobacco: Never  Vaping Use   Vaping Use: Never used  Substance and Sexual Activity   Alcohol use: No   Drug use: No   Sexual activity: Yes    Birth control/protection: None  Other Topics Concern   Not on file  Social History Narrative   Not on file   Social Determinants of Health   Financial Resource Strain: Not on file  Food Insecurity: Not on file  Transportation Needs: Not on file  Physical Activity: Not on file  Stress: Not on file  Social Connections: Not on file  Intimate Partner Violence: Not on file    Physical Exam: Vital signs in last 24 hours: '@BP'$  136/71   Pulse 76   Temp (!) 96.8 F (36 C)   Ht '5\' 5"'$  (1.651 m)   Wt 160 lb (72.6 kg)   SpO2 96%   BMI 26.63 kg/m  GEN: NAD EYE: Sclerae anicteric ENT: MMM CV: Non-tachycardic Pulm:  CTA b/l GI: Soft, NT/ND NEURO:  Alert & Oriented x 3   [name], MD [facility]  10/08/2022 10:25 AM

## 2022-10-08 NOTE — Progress Notes (Signed)
Report given to PACU, vss 

## 2022-10-08 NOTE — Patient Instructions (Addendum)
Resume previous diet. Continue present medications. In setting of new diagnosis of osteoporosis, okay to try and change GERD medication from Pantoprazole '40mg'$  daily to Famotidine (Pepcid) '20mg'$  twice daily (roughly 12 hours apart). If this is not effective contact my office.   Repeat colonoscopy in 10 years for screening purposes.    YOU HAD AN ENDOSCOPIC PROCEDURE TODAY AT [facility]:   Refer to the procedure report that was given to you for any specific questions about what was found during the examination.  If the procedure report does not answer your questions, please call your gastroenterologist to clarify.  If you requested that your care partner not be given the details of your procedure findings, then the procedure report has been included in a sealed envelope for you to review at your convenience later.  YOU SHOULD EXPECT: Some feelings of bloating in the abdomen. Passage of more gas than usual.  Walking can help get rid of the air that was put into your GI tract during the procedure and reduce the bloating. If you had a lower endoscopy (such as a colonoscopy or flexible sigmoidoscopy) you may notice spotting of blood in your stool or on the toilet paper. If you underwent a bowel prep for your procedure, you may not have a normal bowel movement for a few days.  Please Note:  You might notice some irritation and congestion in your nose or some drainage.  This is from the oxygen used during your procedure.  There is no need for concern and it should clear up in a day or so.  SYMPTOMS TO REPORT IMMEDIATELY:  Following lower endoscopy (colonoscopy or flexible sigmoidoscopy):  Excessive amounts of blood in the stool  Significant tenderness or worsening of abdominal pains  Swelling of the abdomen that is new, acute  Fever of 100F or higher  For urgent or emergent issues, a gastroenterologist can be reached at any hour by calling [phone]. Do not use MyChart messaging  for urgent concerns.    DIET:  We do recommend a small meal at first, but then you may proceed to your regular diet.  Drink plenty of fluids but you should avoid alcoholic beverages for 24 hours.  ACTIVITY:  You should plan to take it easy for the rest of today and you should NOT DRIVE or use heavy machinery until tomorrow (because of the sedation medicines used during the test).    FOLLOW UP: Our staff will call the number listed on your records the next business day following your procedure.  We will call around 7:15- 8:00 am to check on you and address any questions or concerns that you may have regarding the information given to you following your procedure. If we do not reach you, we will leave a message.     If any biopsies were taken you will be contacted by phone or by letter within the next 1-3 weeks.  Please call us at [phone] if you have not heard about the biopsies in 3 weeks.    SIGNATURES/CONFIDENTIALITY: You and/or your care partner have signed paperwork which will be entered into your electronic medical record.  These signatures attest to the fact that that the information above on your After Visit Summary has been reviewed and is understood.  Full responsibility of the confidentiality of this discharge information lies with you and/or your care-partner.

## 2022-10-09 ENCOUNTER — Telehealth: Payer: Self-pay

## 2022-10-09 NOTE — Telephone Encounter (Signed)
  Follow up Call-     10/08/2022    9:46 AM  Call back number  Post procedure Call Back phone  # [phone]  Permission to leave phone message Yes     Patient questions:  Do you have a fever, pain , or abdominal swelling? No. Pain Score  0 *  Have you tolerated food without any problems? Yes.    Have you been able to return to your normal activities? Yes.    Do you have any questions about your discharge instructions: Diet   No. Medications  No. Follow up visit  No.  Do you have questions or concerns about your Care? No.  Actions: * If pain score is 4 or above: No action needed, pain <4.

## 2022-11-11 ENCOUNTER — Encounter: Payer: Self-pay | Admitting: Family Medicine

## 2022-11-11 NOTE — Telephone Encounter (Signed)
Per notes on labs dated 06/10/22: "This is surprising since I know you have been exercising and losing weight! Please do not be discouraged, lets look at this again in about 6 months "  Okay for lab apt, or would you like an OV?

## 2022-11-11 NOTE — Telephone Encounter (Signed)
Patient would like to come in to get her A1C checked but only as a lab appointment. Unable to find future labs. Please advise.

## 2022-11-11 NOTE — Addendum Note (Signed)
Addended by: Lamar Blinks C on: 11/11/2022 12:16 PM   Modules accepted: Orders

## 2022-11-13 ENCOUNTER — Other Ambulatory Visit (INDEPENDENT_AMBULATORY_CARE_PROVIDER_SITE_OTHER): Payer: 59

## 2022-11-13 ENCOUNTER — Encounter: Payer: Self-pay | Admitting: Family Medicine

## 2022-11-13 DIAGNOSIS — R7303 Prediabetes: Secondary | ICD-10-CM

## 2022-11-13 LAB — HEMOGLOBIN A1C: Hgb A1c MFr Bld: 6.1 % (ref 4.6–6.5)

## 2022-11-23 ENCOUNTER — Encounter: Payer: Self-pay | Admitting: Family Medicine

## 2022-11-23 DIAGNOSIS — F5101 Primary insomnia: Secondary | ICD-10-CM

## 2022-11-24 MED ORDER — TRAZODONE HCL 50 MG PO TABS: 50.0000 mg | ORAL_TABLET | Freq: Every evening | ORAL | 3 refills | Status: DC | PRN

## 2022-11-24 NOTE — Telephone Encounter (Signed)
Please see the MyChart message reply(ies) for my assessment and plan.  The patient gave consent for this Medical Advice Message and is aware that it may result in a bill to their insurance company as well as the possibility that this may result in a co-payment or deductible. They are an established patient, but are not seeking medical advice exclusively about a problem treated during an in person or video visit in the last 7 days. I did not recommend an in person or video visit within 7 days of my reply.  I spent a total of 8 minutes cumulative time within 7 days through [employer] Lamar Blinks, MD

## 2022-12-01 ENCOUNTER — Telehealth: Payer: 59 | Admitting: Family

## 2022-12-01 DIAGNOSIS — U071 COVID-19: Secondary | ICD-10-CM | POA: Insufficient documentation

## 2022-12-01 MED ORDER — PROMETHAZINE-DM 6.25-15 MG/5ML PO SYRP: 5.0000 mL | ORAL_SOLUTION | Freq: Four times a day (QID) | ORAL | 0 refills | Status: DC | PRN

## 2022-12-01 MED ORDER — PAXLOVID (300/100) 20 X 150 MG & 10 X 100MG PO TBPK: ORAL_TABLET | ORAL | 0 refills | Status: DC

## 2022-12-01 MED ORDER — ALBUTEROL SULFATE HFA 108 (90 BASE) MCG/ACT IN AERS: 2.0000 | INHALATION_SPRAY | Freq: Four times a day (QID) | RESPIRATORY_TRACT | 0 refills | Status: DC | PRN

## 2022-12-01 NOTE — Assessment & Plan Note (Signed)
New. States she was boosted for covid in November.  Given her immune suppressed state on methotrexate, will plan to treat with paxlovid. She understands that paxlovid is only about 30% effective against current variant of covid.  Discussed supportive measures including tylenol or motrin prn pain/myalgias, Promethazine-DM cough syrup as needed for cough, albuterol prn wheezing, mucinex as needed for chest congestion. Get plenty of rest/fluids. Advised of CDC guidelines for self isolation/ ending isolation.  Advised of safe practice guidelines. Symptom Tier reviewed.  Encouraged to monitor for any worsening symptoms; watch for increased shortness of breath, weakness, and signs of dehydration. Advised when to seek emergency care.  Instructed to rest and hydrate well.  Advised to leave the house during recommended isolation period, only if it is necessary to seek medical care

## 2022-12-01 NOTE — Progress Notes (Signed)
MyChart Video Visit    Virtual Visit via Video Note   This visit type was conducted due to national recommendations for restrictions regarding the COVID-19 Pandemic (e.g. social distancing) in an effort to limit this patient's exposure and mitigate transmission in our community. This patient is at least at moderate risk for complications without adequate follow up. This format is felt to be most appropriate for this patient at this time. Physical exam was limited by quality of the video and audio technology used for the visit. CMA was able to get the patient set up on a video visit.  Patient location: Home. Patient and provider in visit Provider location: Office  I discussed the limitations of evaluation and management by telemedicine and the availability of in person appointments. The patient expressed understanding and agreed to proceed.  Visit Date: 12/01/2022  Today's healthcare provider: Nance Pear, NP     Subjective:    Patient ID: Katrina Ford, female    DOB: 1961/10/10, 62 y.o.   MRN: [id number]  Chief Complaint  Patient presents with   Covid Positive    Tested positive on 11/30/22   Covid symptomtoms    Sore throat, congestion, fatigue    HPI  Symptoms began on Friday evening (scratchy throat).  Husband was ill last week but tested negative twice. Then developed coughing, throat, head pressure.  She decided to test 1/29 and it was positive for covid.  She reports some chest tightness which seemed to improve with albuterol, ears burning, myalgia, diarrhea, stomach ache.  She is on methotrexate and Plaquenil for Sjogrens.    Past Medical History:  Diagnosis Date   Chronic headache    Depression    Elevated hemoglobin A1c    pre diabetic   Family history of malignant neoplasm of gastrointestinal tract    Frozen shoulder syndrome    Gastritis    GERD (gastroesophageal reflux disease)    Hiatal hernia    IBS (irritable bowel syndrome)    Kidney  stones    Osteoporosis    Sciatic leg pain    May 2018   Sjogren's disease [facility])    Sjogren's syndrome ([place]) 06/27/2018   With inflammatory arthritis. [facility] Rheumatology Dr. Trudie Reed    Sleep apnea     Past Surgical History:  Procedure Laterality Date   CHOLECYSTECTOMY  2000   ESOPHAGEAL MANOMETRY  11/28/2012   Procedure: ESOPHAGEAL MANOMETRY (EM);  Surgeon: Sable Feil, MD;  Location: [facility];  Service: Endoscopy;  Laterality: N/A;   FOOT SURGERY Bilateral 03/2013   LITHOTRIPSY  2003&2012   TONSILLECTOMY      Family History  Problem Relation Age of Onset   Diabetes Mother    Heart disease Mother    Diverticulitis Mother    Hyperlipidemia Mother    Hyperlipidemia Father    Cancer Father        skin cancer   Hyperlipidemia Sister    Hyperlipidemia Brother    Colon cancer Maternal Uncle 5       Passed away in 01-31-2009   Diabetes Maternal Uncle    Heart disease Maternal Uncle    Breast cancer Paternal Aunt    Colon cancer Paternal Uncle    Heart disease Paternal Grandmother        Siblings also   Fibromyalgia Cousin    Fibromyalgia Cousin    Fibromyalgia Cousin    Lupus Cousin    Lupus Cousin    Esophageal cancer Neg Hx  Stomach cancer Neg Hx    Rectal cancer Neg Hx     Social History   Socioeconomic History   Marital status: Married    Spouse name: Not on file   Number of children: 2   Years of education: Not on file   Highest education level: Not on file  Occupational History   Not on file  Tobacco Use   Smoking status: Never   Smokeless tobacco: Never  Vaping Use   Vaping Use: Never used  Substance and Sexual Activity   Alcohol use: No   Drug use: No   Sexual activity: Yes    Birth control/protection: None  Other Topics Concern   Not on file  Social History Narrative   Not on file   Social Determinants of Health   Financial Resource Strain: Not on file  Food Insecurity: Not on file  Transportation Needs: Not on file  Physical  Activity: Not on file  Stress: Not on file  Social Connections: Not on file  Intimate Partner Violence: Not on file    Outpatient Medications Prior to Visit  Medication Sig Dispense Refill   celecoxib (CELEBREX) 200 MG capsule Take 200 mg by mouth daily.     Cholecalciferol (VITAMIN D3) 3000 units TABS Take by mouth daily.      cyclobenzaprine (FLEXERIL) 10 MG tablet Take 1 tablet (10 mg total) by mouth 2 (two) times daily as needed for muscle spasms. 30 tablet 0   Estradiol 10 MCG TABS vaginal tablet      folic acid (FOLVITE) 1 MG tablet Take 2 mg by mouth daily.   6   hydroxychloroquine (PLAQUENIL) 200 MG tablet 400 mg daily.      methotrexate (RHEUMATREX) 2.5 MG tablet TK 6 TS PO 1 TIME A WK  6   pantoprazole (PROTONIX) 40 MG tablet Take 1 tablet (40 mg total) by mouth daily. 90 tablet 3   traZODone (DESYREL) 50 MG tablet Take 1-2 tablets (50-100 mg total) by mouth at bedtime as needed. for sleep 180 tablet 3   ondansetron (ZOFRAN) 4 MG tablet Take 1 tablet (4 mg total) by mouth every 8 (eight) hours as needed for nausea or vomiting. 4 tablet 0   No facility-administered medications prior to visit.    No Known Allergies  ROS    See HPI Objective:    Physical Exam  There were no vitals taken for this visit. Wt Readings from Last 3 Encounters:  10/08/22 160 lb (72.6 kg)  09/17/22 159 lb (72.1 kg)  06/10/22 152 lb 12.8 oz (69.3 kg)    Gen: Awake, alert, no acute distress- appears tired Resp: Breathing is even and non-labored Psych: calm/pleasant demeanor Neuro: Alert and Oriented x 3, + facial symmetry, speech is clear.     Assessment & Plan:   Problem List Items Addressed This Visit       Unprioritized   COVID-19 - Primary    New. States she was boosted for covid in November.  Given her immune suppressed state on methotrexate, will plan to treat with paxlovid. She understands that paxlovid is only about 30% effective against current variant of covid.  Discussed  supportive measures including tylenol or motrin prn pain/myalgias, Promethazine-DM cough syrup as needed for cough, albuterol prn wheezing, mucinex as needed for chest congestion. Get plenty of rest/fluids. Advised of CDC guidelines for self isolation/ ending isolation.  Advised of safe practice guidelines. Symptom Tier reviewed.  Encouraged to monitor for any worsening symptoms; watch for  increased shortness of breath, weakness, and signs of dehydration. Advised when to seek emergency care.  Instructed to rest and hydrate well.  Advised to leave the house during recommended isolation period, only if it is necessary to seek medical care       Relevant Medications   nirmatrelvir & ritonavir (PAXLOVID, 300/100,) 20 x 150 MG & 10 x '100MG'$  TBPK    I have discontinued Katrina Ford's ondansetron. I am also having her start on Paxlovid (300/100), albuterol, and promethazine-dextromethorphan. Additionally, I am having her maintain her hydroxychloroquine, methotrexate, folic acid, Vitamin D3, cyclobenzaprine, celecoxib, pantoprazole, Estradiol, and traZODone.  Meds ordered this encounter  Medications   nirmatrelvir & ritonavir (PAXLOVID, 300/100,) 20 x 150 MG & 10 x '100MG'$  TBPK    Sig: Nirmatrelvir 300 mg po bid with ritonavir 100 mg bid, administered together, twice daily for 5 days    Dispense:  39 tablet    Refill:  0    Order Specific Question:   Supervising Provider    Answer:   Penni Homans A [[id number]]   albuterol (VENTOLIN HFA) 108 (90 Base) MCG/ACT inhaler    Sig: Inhale 2 puffs into the lungs every 6 (six) hours as needed for wheezing or shortness of breath.    Dispense:  8 g    Refill:  0    Order Specific Question:   Supervising Provider    Answer:   Penni Homans A [[id number]]   promethazine-dextromethorphan (PROMETHAZINE-DM) 6.25-15 MG/5ML syrup    Sig: Take 5 mLs by mouth 4 (four) times daily as needed for cough.    Dispense:  180 mL    Refill:  0    Order Specific Question:   Supervising  Provider    Answer:   Penni Homans A [[id number]]    I discussed the assessment and treatment plan with the patient. The patient was provided an opportunity to ask questions and all were answered. The patient agreed with the plan and demonstrated an understanding of the instructions.   The patient was advised to call back or seek an in-person evaluation if the symptoms worsen or if the condition fails to improve as anticipated.   Nance Pear, NP [facility] [place] (phone) [phone] (fax)  [place]

## 2022-12-24 ENCOUNTER — Ambulatory Visit: Payer: 59 | Admitting: Internal Medicine

## 2022-12-24 ENCOUNTER — Encounter: Payer: Self-pay | Admitting: Internal Medicine

## 2022-12-24 VITALS — BP 120/70 | HR 85 | Ht 65.5 in | Wt 169.0 lb

## 2022-12-24 DIAGNOSIS — R1013 Epigastric pain: Secondary | ICD-10-CM

## 2022-12-24 DIAGNOSIS — K219 Gastro-esophageal reflux disease without esophagitis: Secondary | ICD-10-CM

## 2022-12-24 DIAGNOSIS — K580 Irritable bowel syndrome with diarrhea: Secondary | ICD-10-CM

## 2022-12-24 MED ORDER — RIFAXIMIN 550 MG PO TABS: 550.0000 mg | ORAL_TABLET | Freq: Three times a day (TID) | ORAL | 0 refills | Status: DC

## 2022-12-24 MED ORDER — LANSOPRAZOLE 30 MG PO CPDR: 30.0000 mg | DELAYED_RELEASE_CAPSULE | Freq: Every morning | ORAL | 1 refills | Status: DC

## 2022-12-24 NOTE — Patient Instructions (Addendum)
We have sent the following medications to your pharmacy for you to pick up at your convenience: Xifaxan 533m 3 times daily for 14 days Lansoprazole   Please purchase the following medications over the counter and take as directed: Pepcid 20 mg every evening needed for heart burn.  We scheduled your follow up for 03/24/2023 at 10:10 am   _______________________________________________________  If your blood pressure at your visit was 140/90 or greater, please contact your primary care physician to follow up on this.  _______________________________________________________  If you are age 154or older, your body mass index should be between 23-30. Your Body mass index is 27.7 kg/m. If this is out of the aforementioned range listed, please consider follow up with your Primary Care Provider.  If you are age 171or younger, your body mass index should be between 19-25. Your Body mass index is 27.7 kg/m. If this is out of the aformentioned range listed, please consider follow up with your Primary Care Provider.   ________________________________________________________  The [place] providers would like to encourage you to use [facility] communicate with providers for non-urgent requests or questions.  Due to long hold times on the telephone, sending your provider a message by [facility] be a faster and more efficient way to get a response.  Please allow 48 business hours for a response.  Please remember that this is for non-urgent requests.  _______________________________________________________ Thank you for trusting me with your gastrointestinal care!   JZenovia JarredMD

## 2022-12-24 NOTE — Progress Notes (Signed)
   Subjective:    Patient ID: Katrina Ford, female    DOB: 05/23/1961, 62 y.o.   MRN: [id number]  HPI Karly Reuss is a 62 year old female with a history of nonerosive GERD, IBS with diarrhea, Sjogren's syndrome on methotrexate and Plaquenil, fibromyalgia on Celebrex, kidney stones and recent diagnosis of osteoporosis on screening bone density who is here for follow-up.  I last saw her for her screening colonoscopy on 10/08/2022.  Colonoscopy on 10/08/2022 was normal.  She made me aware at her colonoscopy of her osteoporosis diagnosis and we changed pantoprazole from 40 mg daily to famotidine 20 mg twice daily.  She has been using this but having a daily epigastric gnawing pain on a daily basis.  This is worse at night.  It does not radiate.  She is not having a lot of true heartburn though if she eats [place] food the pain is worse and she will also have some reflux.  No dysphagia symptom.  No nausea or vomiting.  Since summer she has had loose mushy urgent stools.  Rarely if ever formed.  Can be throughout the morning but averages about 3 times each morning.  This has been something that has been recurrent over many many years and can last for months.   Review of Systems As per HPI, otherwise negative  Current Medications, Allergies, Past Medical History, Past Surgical History, Family History and Social History were reviewed in [employer] record.    Objective:   Physical Exam BP 120/70   Pulse 85   Ht 5' 5.5" (1.664 m)   Wt 169 lb (76.7 kg)   BMI 27.70 kg/m  Gen: awake, alert, NAD HEENT: anicteric  Neuro: nonfocal      Assessment & Plan:  62 year old female with a history of nonerosive GERD, IBS with diarrhea, Sjogren's syndrome on methotrexate and Plaquenil, fibromyalgia on Celebrex, kidney stones and recent diagnosis of osteoporosis on screening bone density who is here for follow-up.   GERD/epigastric pain --H2 blocker has not been as effective for her  as well as PPI.  One of her biggest symptoms at this point is epigastric discomfort and this is most likely a GERD/dyspeptic symptom.  Her gallbladder is absent since 2001.  We discussed bone health as it relates to PPI and certainly she has symptoms to warrant daily PPI.  I encouraged her to discuss this further with Dr. Lorelei Pont and she may want to consider endocrinology opinion regarding osteoporosis and PPI.  She has taken prednisone typically once or twice per year and we discussed how this also can significantly affect bone health (likely more than PPI).  Plan as follows: -- Begin lansoprazole 30 mg daily; she can still use famotidine 20 mg in the evening on an as-needed basis -- We can consider vonoprazan, p-cab, therapy at follow-up but I am unaware that this class is better for bone density than PPI. -- If symptoms persist an upper endoscopy is recommended; last exam 2011 --no Barrett's at that time, distal biopsies showed reflux type inflammation  2.  IBS-D --recent normal colonoscopy. -- Rifaximin 550 mg 3 times daily x 14 days  3.  Colon cancer screening --screening colonoscopy repeat recommended December 2033  Follow-up with me in May, sooner if needed

## 2022-12-25 ENCOUNTER — Encounter: Payer: Self-pay | Admitting: Internal Medicine

## 2022-12-25 MED ORDER — LANSOPRAZOLE 30 MG PO CPDR: 30.0000 mg | DELAYED_RELEASE_CAPSULE | Freq: Every morning | ORAL | 1 refills | Status: DC

## 2022-12-28 ENCOUNTER — Other Ambulatory Visit: Payer: Self-pay | Admitting: *Deleted

## 2022-12-28 DIAGNOSIS — F5101 Primary insomnia: Secondary | ICD-10-CM

## 2022-12-28 MED ORDER — TRAZODONE HCL 50 MG PO TABS: 50.0000 mg | ORAL_TABLET | Freq: Every evening | ORAL | 1 refills | Status: DC | PRN

## 2023-01-03 ENCOUNTER — Encounter: Payer: Self-pay | Admitting: Family Medicine

## 2023-01-03 LAB — HM MAMMOGRAPHY

## 2023-01-04 NOTE — Telephone Encounter (Signed)
Mammogram has been printed and abstracted.

## 2023-01-25 ENCOUNTER — Other Ambulatory Visit: Payer: Self-pay

## 2023-01-25 DIAGNOSIS — F5101 Primary insomnia: Secondary | ICD-10-CM

## 2023-01-25 MED ORDER — TRAZODONE HCL 50 MG PO TABS: 50.0000 mg | ORAL_TABLET | Freq: Every evening | ORAL | 0 refills | Status: DC | PRN

## 2023-01-26 ENCOUNTER — Encounter: Payer: Self-pay | Admitting: Internal Medicine

## 2023-03-24 ENCOUNTER — Ambulatory Visit: Payer: 59 | Admitting: Internal Medicine

## 2023-03-24 ENCOUNTER — Encounter: Payer: Self-pay | Admitting: Internal Medicine

## 2023-03-24 VITALS — BP 126/70 | HR 80 | Ht 65.0 in | Wt 165.1 lb

## 2023-03-24 DIAGNOSIS — K580 Irritable bowel syndrome with diarrhea: Secondary | ICD-10-CM | POA: Diagnosis not present

## 2023-03-24 DIAGNOSIS — K219 Gastro-esophageal reflux disease without esophagitis: Secondary | ICD-10-CM

## 2023-03-24 DIAGNOSIS — R1013 Epigastric pain: Secondary | ICD-10-CM

## 2023-03-24 NOTE — Progress Notes (Signed)
   Subjective:    Patient ID: Katrina Ford, female    DOB: 01/03/1961, 62 y.o.   MRN: [id number]  HPI Katrina Ford is a 62 year old female with a history of nonerosive GERD, IBS with diarrhea, Sjogren's syndrome on methotrexate and Plaquenil, fibromyalgia on Celebrex, kidney stones and recent diagnosis of osteoporosis on screening bone density who is here for follow-up.  I last saw her on 12/24/2022.  She reports with the lansoprazole symptoms have improved dramatically.  She is taking 30 mg a day.  She is now only using Pepcid 20 mg in the evening on an as-needed basis.  Her heartburn and epigastric pain have basically resolved.  No nausea or vomiting.  No dysphagia.  She took rifaximin for IBS with loose stool and this helped significantly.  About a month after therapy she has had some recurrent days with loose stools which are urgent and cramping.  1 accident last week.  No blood in stool or melena.  Worse with stress and nervousness.  She did recently lose her dog about a month ago after 17-1/2 years and feels that this may have exacerbated her diarrhea.  She is status postcholecystectomy  Review of Systems As per HPI, otherwise negative  Current Medications, Allergies, Past Medical History, Past Surgical History, Family History and Social History were reviewed in [employer] record.    Objective:   Physical Exam BP 126/70 (BP Location: Left Arm, Patient Position: Sitting, Cuff Size: Normal)   Pulse 80   Ht 5\' 5"  (1.651 m)   Wt 165 lb 2 oz (74.9 kg)   BMI 27.48 kg/m  Gen: awake, alert, NAD HEENT: anicteric  Neuro: nonfocal     Assessment & Plan:   62 year old female with a history of nonerosive GERD, IBS with diarrhea, Sjogren's syndrome on methotrexate and Plaquenil, fibromyalgia on Celebrex, kidney stones and recent diagnosis of osteoporosis on screening bone density who is here for follow-up.  GERD and dyspepsia --H2 blocker ineffective.  Resolution  of symptoms with reintroduction of PPI -- Continue lansoprazole 30 mg a day -- Can continue famotidine 20 mg in the evening as needed -- Bone density monitoring and treatment per primary care  2.  IBS-D --favorable response to rifaximin however recurrent symptoms.  She is status postcholecystectomy and so Viberzi is not recommended.  We discussed this today and I think she can get by with as needed Imodium for now but I will be happy to consider Lotronex therapy if this symptom becomes more frequent or altering to her function -- Imodium per box instruction as needed -- Lotronex if symptoms worsen or become limiting to function  3.  CRC screening --repeat screening recommended December 2033  Annual follow-up, sooner if needed  30 minutes total spent today including patient facing time, coordination of care, reviewing medical history/procedures/pertinent radiology studies, and documentation of the encounter.

## 2023-03-24 NOTE — Patient Instructions (Signed)
Stay on pepcid as needed.  Take imodium as needed.  Continue lansoprazole 30 mg daily.  _______________________________________________________  If your blood pressure at your visit was 140/90 or greater, please contact your primary care physician to follow up on this.  _______________________________________________________  If you are age 62 or older, your body mass index should be between 23-30. Your Body mass index is 31.58 kg/m. If this is out of the aforementioned range listed, please consider follow up with your Primary Care Provider.  If you are age 14 or younger, your body mass index should be between 19-25. Your Body mass index is 31.58 kg/m. If this is out of the aformentioned range listed, please consider follow up with your Primary Care Provider.   ________________________________________________________  The [facility] providers would like to encourage you to use [facility] to communicate with providers for non-urgent requests or questions.  Due to long hold times on the telephone, sending your provider a message by [facility] may be a faster and more efficient way to get a response.  Please allow 48 business hours for a response.  Please remember that this is for non-urgent requests.  _______________________________________________________  Due to recent changes in healthcare laws, you may see the results of your imaging and laboratory studies on MyChart before your provider has had a chance to review them.  We understand that in some cases there may be results that are confusing or concerning to you. Not all laboratory results come back in the same time frame and the provider may be waiting for multiple results in order to interpret others.  Please give [place] 48 hours in order for your provider to thoroughly review all the results before contacting the office for clarification of your results.

## 2023-05-14 ENCOUNTER — Other Ambulatory Visit: Payer: Self-pay | Admitting: Family Medicine

## 2023-05-14 DIAGNOSIS — F5101 Primary insomnia: Secondary | ICD-10-CM

## 2023-07-27 NOTE — Progress Notes (Unsigned)
[facility] [address], [address] [place], [place] [address] [phone]  Date:  07/28/2023   Name:  Katrina Ford   DOB:  07/07/1961   MRN:  [id number]  PCP:  Pearline Cables, MD    Chief Complaint: No chief complaint on file.   History of Present Illness:  Katrina Ford is a 62 y.o. very pleasant female patient who presents with the following:  Patient seen today with concern of a cough Most recent visit with myself was August of last year History of OSA, GERD, sjogren's syndrome, polyarthrlagia, pre-diabetes, depression, FBM, kidney stones   She was seen by her rheumatologist, Dr. Nickola Major in April  She does take Plaquenil and methotrexate Patient Active Problem List   Diagnosis Date Noted   COVID-19 12/01/2022   Osteoporosis 07/20/2022   Prediabetes 09/18/2020   Fibromyalgia 10/10/2018   Sjogren's syndrome (HCC) 06/27/2018   Urine incontinence 10/03/2015   Polyarthralgia 08/19/2015   OSA (obstructive sleep apnea) 08/19/2015   Physical exam 08/14/2014   Menopausal vaginal dryness 08/14/2014   Depression 01/05/2014   Abdominal pain, RLQ (right lower quadrant) 10/05/2011   Abdominal pain 10/01/2011   Constipation, slow transit 10/01/2011   Abnormal liver enzymes 10/01/2011   IRRITABLE BOWEL SYNDROME 07/18/2010   GERD 12/01/2007   HIATAL HERNIA 12/01/2007   HEADACHE, CHRONIC 12/01/2007   GASTRITIS 07/27/2000    Past Medical History:  Diagnosis Date   Chronic headache    Depression    Elevated hemoglobin A1c    pre diabetic   Family history of malignant neoplasm of gastrointestinal tract    Frozen shoulder syndrome    Gastritis    GERD (gastroesophageal reflux disease)    Hiatal hernia    IBS (irritable bowel syndrome)    Kidney stones    Osteoporosis    Sciatic leg pain    May 2018   Sjogren's disease (HCC)    Sjogren's syndrome (HCC) 06/27/2018   With inflammatory arthritis. [facility]  Rheumatology Dr. Nickola Major    Sleep apnea     Past Surgical History:  Procedure Laterality Date   CHOLECYSTECTOMY  1999/10/06   ESOPHAGEAL MANOMETRY  11/28/2012   Procedure: ESOPHAGEAL MANOMETRY (EM);  Surgeon: Mardella Layman, MD;  Location: [facility];  Service: Endoscopy;  Laterality: N/A;   FOOT SURGERY Bilateral 03/2013   LITHOTRIPSY  2003&2012   TONSILLECTOMY      Social History   Tobacco Use   Smoking status: Never   Smokeless tobacco: Never  Vaping Use   Vaping status: Never Used  Substance Use Topics   Alcohol use: No   Drug use: No    Family History  Problem Relation Age of Onset   Diabetes Mother    Heart disease Mother    Diverticulitis Mother    Hyperlipidemia Mother    Hyperlipidemia Father    Cancer Father        skin cancer   Hyperlipidemia Sister    Hyperlipidemia Brother    Colon cancer Maternal Uncle 27       Passed away in October 05, 2009   Diabetes Maternal Uncle    Heart disease Maternal Uncle    Breast cancer Paternal Aunt    Colon cancer Paternal Uncle    Heart disease Paternal Grandmother        Siblings also   Fibromyalgia Cousin    Fibromyalgia Cousin    Fibromyalgia Cousin    Lupus Cousin  Lupus Cousin    Esophageal cancer Neg Hx    Stomach cancer Neg Hx    Rectal cancer Neg Hx     No Known Allergies  Medication list has been reviewed and updated.  Current Outpatient Medications on File Prior to Visit  Medication Sig Dispense Refill   celecoxib (CELEBREX) 200 MG capsule Take 200 mg by mouth daily.     Cholecalciferol (VITAMIN D3) 125 MCG (5000 UT) CAPS Take 1 capsule by mouth daily.     folic acid (FOLVITE) 1 MG tablet Take 2 mg by mouth daily.   6   hydroxychloroquine (PLAQUENIL) 200 MG tablet 400 mg daily.      lansoprazole (PREVACID) 30 MG capsule Take 1 capsule (30 mg total) by mouth in the morning. 90 capsule 1   methotrexate (RHEUMATREX) 2.5 MG tablet TK 6 TS PO 1 TIME A WK  6   traZODone (DESYREL) 50 MG tablet TAKE 1 TO 2 TABLETS  BY MOUTH AT  BEDTIME AS NEEDED FOR SLEEP 180 tablet 0   No current facility-administered medications on file prior to visit.    Review of Systems:  As per HPI- otherwise negative.   Physical Examination: There were no vitals filed for this visit. There were no vitals filed for this visit. There is no height or weight on file to calculate BMI. Ideal Body Weight:    GEN: no acute distress. HEENT: Atraumatic, Normocephalic.  Ears and Nose: No external deformity. CV: RRR, No M/G/R. No JVD. No thrill. No extra heart sounds. PULM: CTA B, no wheezes, crackles, rhonchi. No retractions. No resp. distress. No accessory muscle use. ABD: S, NT, ND, +BS. No rebound. No HSM. EXTR: No c/c/e PSYCH: Normally interactive. Conversant.    Assessment and Plan: ***  Signed Abbe Amsterdam, MD

## 2023-07-27 NOTE — Patient Instructions (Incomplete)
It was good to see you again today!

## 2023-07-28 ENCOUNTER — Ambulatory Visit: Payer: 59 | Admitting: Family Medicine

## 2023-07-28 ENCOUNTER — Encounter: Payer: Self-pay | Admitting: Family Medicine

## 2023-07-28 ENCOUNTER — Ambulatory Visit (HOSPITAL_BASED_OUTPATIENT_CLINIC_OR_DEPARTMENT_OTHER)
Admission: RE | Admit: 2023-07-28 | Discharge: 2023-07-28 | Disposition: A | Discharge status: Home / Self Care | Payer: 59 | Source: Ambulatory Visit | Attending: Family Medicine | Admitting: Family Medicine

## 2023-07-28 VITALS — BP 112/60 | HR 82 | Temp 98.2°F | Resp 18 | Ht 65.0 in | Wt 165.2 lb

## 2023-07-28 DIAGNOSIS — R051 Acute cough: Secondary | ICD-10-CM

## 2023-07-28 DIAGNOSIS — R509 Fever, unspecified: Secondary | ICD-10-CM | POA: Diagnosis not present

## 2023-07-28 LAB — POCT INFLUENZA A/B
Influenza A, POC: NEGATIVE
Influenza B, POC: NEGATIVE

## 2023-07-28 LAB — POC COVID19 BINAXNOW: SARS Coronavirus 2 Ag: NEGATIVE

## 2023-07-28 MED ORDER — AZITHROMYCIN 250 MG PO TABS: ORAL_TABLET | ORAL | 0 refills | Status: AC

## 2023-07-28 MED ORDER — HYDROCODONE BIT-HOMATROP MBR 5-1.5 MG/5ML PO SOLN
5.0000 mL | Freq: Three times a day (TID) | ORAL | 0 refills | Status: DC | PRN
Start: 2023-07-28 — End: 2023-12-10

## 2023-08-09 MED ORDER — DOXYCYCLINE HYCLATE 100 MG PO CAPS: 100.0000 mg | ORAL_CAPSULE | Freq: Two times a day (BID) | ORAL | 0 refills | Status: DC

## 2023-08-09 NOTE — Addendum Note (Signed)
Addended by: Abbe Amsterdam C on: 08/09/2023 08:20 PM   Modules accepted: Orders

## 2023-08-12 ENCOUNTER — Other Ambulatory Visit: Payer: Self-pay | Admitting: Family Medicine

## 2023-08-12 DIAGNOSIS — F5101 Primary insomnia: Secondary | ICD-10-CM

## 2023-08-27 ENCOUNTER — Telehealth: Payer: Self-pay | Admitting: Pharmacy Technician

## 2023-08-27 NOTE — Telephone Encounter (Signed)
Pharmacy Patient Advocate Encounter   Received notification from CoverMyMeds that prior authorization for LANSOPRAZOLE 30MG  is required/requested.   Insurance verification completed.   The patient is insured through [facility] .   Per test claim: PA required; PA submitted to [facility] via CoverMyMeds Key/confirmation #/EOC [id number] Status is pending

## 2023-09-01 NOTE — Progress Notes (Addendum)
[facility] [address], [address] [place], [place] [address] [phone]  Date:  09/02/2023   Name:  Katrina Ford   DOB:  08-21-1961   MRN:  [id number]  PCP:  Pearline Cables, MD    Chief Complaint: Annual Exam   History of Present Illness:  Katrina Ford is a 62 y.o. very pleasant female patient who presents with the following:  Patient seen today for physical exam Most recent visit with myself was about a month ago for cough-she was negative for COVID and flu at that time, I treated her with azithromycin.  History of OSA, GERD, sjogren's syndrome, polyarthrlagia, pre-diabetes, depression, FBM, kidney stones    She was seen by her rheumatologist, Dr. Nickola Major in April She does take Plaquenil and methotrexate She notes her joints are in a bit of a flare up right now- she is having a bit more joint pain, skin dry and eyes itching She knows how to manage these flares   Pap smear-I believe this is being done per GYN- Dr Billy Coast.  Flu shot- give today  COVID booster- Mammogram-up-to-date Bone density-completed last year Colon cancer screening-colonoscopy last year Can update lab work today  Methotrexate Trazodone at bedtime Plaquenil Vaginal estrogen tablet  She has noted left hip pain for perhaps a year.  Will vary in intensity She did some sort of steroid shot in the lateral hip a few months ago; it sounds like this was for greater trochanteric bursitis.  She notes it helped for a while but then symptoms returned, however pain is now more in the groin area as opposed to lateral.  She has not yet had an x-ray  She tried a celebrex that she had on hand which did help It hurts more if she walks a lot, but she can also wake up with pain in the am   She is using trazodone for sleep and it does help   Patient Active Problem List   Diagnosis Date Noted   COVID-19 12/01/2022   Osteoporosis 07/20/2022   Prediabetes  09/18/2020   Fibromyalgia 10/10/2018   Sjogren's syndrome (HCC) 06/27/2018   Urine incontinence 10/03/2015   Polyarthralgia 08/19/2015   OSA (obstructive sleep apnea) 08/19/2015   Physical exam 08/14/2014   Menopausal vaginal dryness 08/14/2014   Depression 01/05/2014   Abdominal pain, RLQ (right lower quadrant) 10/05/2011   Abdominal pain 10/01/2011   Constipation, slow transit 10/01/2011   Abnormal liver enzymes 10/01/2011   IRRITABLE BOWEL SYNDROME 07/18/2010   GERD 12/01/2007   HIATAL HERNIA 12/01/2007   HEADACHE, CHRONIC 12/01/2007   GASTRITIS 07/27/2000    Past Medical History:  Diagnosis Date   Chronic headache    Depression    Elevated hemoglobin A1c    pre diabetic   Family history of malignant neoplasm of gastrointestinal tract    Frozen shoulder syndrome    Gastritis    GERD (gastroesophageal reflux disease)    Hiatal hernia    IBS (irritable bowel syndrome)    Kidney stones    Osteoporosis    Sciatic leg pain    May 2018   Sjogren's disease (HCC)    Sjogren's syndrome (HCC) 06/27/2018   With inflammatory arthritis. [facility] Rheumatology Dr. Nickola Major    Sleep apnea     Past Surgical History:  Procedure Laterality Date   CHOLECYSTECTOMY  2000   ESOPHAGEAL MANOMETRY  11/28/2012   Procedure: ESOPHAGEAL MANOMETRY (EM);  Surgeon: Onalee Hua  Hale Bogus, MD;  Location: [facility];  Service: Endoscopy;  Laterality: N/A;   FOOT SURGERY Bilateral 03/2013   LITHOTRIPSY  2003&2012   TONSILLECTOMY      Social History   Tobacco Use   Smoking status: Never   Smokeless tobacco: Never  Vaping Use   Vaping status: Never Used  Substance Use Topics   Alcohol use: No   Drug use: No    Family History  Problem Relation Age of Onset   Diabetes Mother    Heart disease Mother    Diverticulitis Mother    Hyperlipidemia Mother    Hyperlipidemia Father    Cancer Father        skin cancer   Hyperlipidemia Sister    Hyperlipidemia Brother    Colon cancer Maternal  Uncle 74       Passed away in 09-28-09   Diabetes Maternal Uncle    Heart disease Maternal Uncle    Breast cancer Paternal Aunt    Colon cancer Paternal Uncle    Heart disease Paternal Grandmother        Siblings also   Fibromyalgia Cousin    Fibromyalgia Cousin    Fibromyalgia Cousin    Lupus Cousin    Lupus Cousin    Esophageal cancer Neg Hx    Stomach cancer Neg Hx    Rectal cancer Neg Hx     No Known Allergies  Medication list has been reviewed and updated.  Current Outpatient Medications on File Prior to Visit  Medication Sig Dispense Refill   celecoxib (CELEBREX) 200 MG capsule Take 200 mg by mouth daily.     Cholecalciferol (VITAMIN D3) 125 MCG (5000 UT) CAPS Take 1 capsule by mouth daily.     Estradiol 10 MCG TABS vaginal tablet Place 1 tablet vaginally 2 (two) times a week.     folic acid (FOLVITE) 1 MG tablet Take 2 mg by mouth daily.   6   hydroxychloroquine (PLAQUENIL) 200 MG tablet 400 mg daily.      lansoprazole (PREVACID) 30 MG capsule Take 1 capsule (30 mg total) by mouth in the morning. 90 capsule 1   magnesium (MAGTAB) 84 MG ( ) TBCR SR tablet Take 84 mg by mouth.     methotrexate (RHEUMATREX) 2.5 MG tablet TK 6 TS PO 1 TIME A WK  6   traZODone (DESYREL) 50 MG tablet Take 1-2 tablets (50-100 mg total) by mouth at bedtime as needed for sleep. 180 tablet 0   vitamin B-12 (CYANOCOBALAMIN) 100 MCG tablet Take 100 mcg by mouth daily.     Vitamin D-Vitamin K (VITAMIN K2-VITAMIN D3 PO) Take by mouth.     HYDROcodone bit-homatropine (HYCODAN) 5-1.5 MG/5ML syrup Take 5 mLs by mouth every 8 (eight) hours as needed for cough. 90 mL 0   No current facility-administered medications on file prior to visit.    Review of Systems:  As per HPI- otherwise negative.   Physical Examination: Vitals:   09/02/23 1300  BP: 120/74  Pulse: 76  Resp: 18  SpO2: 98%   Vitals:   09/02/23 1300  Weight: 165 lb 9.6 oz (75.1 kg)  Height: 5\' 5"  (1.651 m)   Body mass index is  27.56 kg/m. Ideal Body Weight: Weight in (lb) to have BMI = 25: 149.9  GEN: no acute distress.  Mild overweight, looks well  HEENT: Atraumatic, Normocephalic.  Bilateral TM wnl, oropharynx normal.  PEERL,EOMI.   Ears and Nose: No external deformity. CV: RRR, No M/G/R.  No JVD. No thrill. No extra heart sounds. PULM: CTA B, no wheezes, crackles, rhonchi. No retractions. No resp. distress. No accessory muscle use. ABD: S, NT, ND, +BS. No rebound. No HSM. EXTR: No c/c/e PSYCH: Normally interactive. Conversant.  Left hip: Patient notes joint discomfort with flexed abduction and adduction.  There is some minor tenderness over the trochanteric bursa  Assessment and Plan: Physical exam  Prediabetes - Plan: Comprehensive metabolic panel, Hemoglobin A1c  Screening for hyperlipidemia - Plan: Lipid panel  Screening for thyroid disorder - Plan: TSH  Screening for deficiency anemia - Plan: CBC  Vitamin D deficiency - Plan: VITAMIN D 25 Hydroxy (Vit-D Deficiency, Fractures)  Left hip pain - Plan: DG Hip Unilat W OR W/O Pelvis 2-3 Views Left  Need for influenza vaccination - Plan: Flu vaccine trivalent PF, 6mos and older(Flulaval,Afluria,Fluarix,Fluzone)  Physical exam today.  Encouraged healthy diet and exercise routine Will plan further follow- up pending labs. Flu vaccine today Suspect she may have degenerative changes of her left hip.  Ordered x-rays which she will have done at the med [facility]  Signed Abbe Amsterdam, MD  Received labs 11/1- message to pt with labs as below Results for orders placed or performed in visit on 09/02/23  CBC  Result Value Ref Range   WBC 7.4 4.0 - 10.5 K/uL   RBC 4.49 3.87 - 5.11 Mil/uL   Platelets 293.0 150.0 - 400.0 K/uL   Hemoglobin 14.2 12.0 - 15.0 g/dL   HCT 16.1 09.6 - 04.5 %   MCV 97.0 78.0 - 100.0 fl   MCHC 32.6 30.0 - 36.0 g/dL   RDW 40.9 81.1 - 91.4 %  Comprehensive metabolic panel  Result Value Ref Range   Sodium 138 135 -  145 mEq/L   Potassium 4.5 3.5 - 5.1 mEq/L   Chloride 101 96 - 112 mEq/L   CO2 29 19 - 32 mEq/L   Glucose, Bld 82 70 - 99 mg/dL   BUN 21 6 - 23 mg/dL   Creatinine, Ser 7.82 0.40 - 1.20 mg/dL   Total Bilirubin 0.4 0.2 - 1.2 mg/dL   Alkaline Phosphatase 77 39 - 117 U/L   AST 16 0 - 37 U/L   ALT 12 0 - 35 U/L   Total Protein 6.9 6.0 - 8.3 g/dL   Albumin 4.7 3.5 - 5.2 g/dL   GFR 95.62 >13.08 mL/min   Calcium 9.7 8.4 - 10.5 mg/dL  Hemoglobin M5H  Result Value Ref Range   Hgb A1c MFr Bld 5.8 4.6 - 6.5 %  Lipid panel  Result Value Ref Range   Cholesterol 189 0 - 200 mg/dL   Triglycerides 846.9 (H) 0.0 - 149.0 mg/dL   HDL 62.95 >28.41 mg/dL   VLDL 32.4 0.0 - 40.1 mg/dL   LDL Cholesterol 94 0 - 99 mg/dL   Total CHOL/HDL Ratio 3    NonHDL 129.30   TSH  Result Value Ref Range   TSH 1.86 0.35 - 5.50 uIU/mL  VITAMIN D 25 Hydroxy (Vit-D Deficiency, Fractures)  Result Value Ref Range   VITD 44.64 30.00 - 100.00 ng/mL

## 2023-09-01 NOTE — Patient Instructions (Signed)
It was great to see you again today, I will be in touch with your labs as soon as possible 

## 2023-09-02 ENCOUNTER — Encounter: Payer: Self-pay | Admitting: Family Medicine

## 2023-09-02 ENCOUNTER — Ambulatory Visit (INDEPENDENT_AMBULATORY_CARE_PROVIDER_SITE_OTHER): Payer: 59 | Admitting: Family Medicine

## 2023-09-02 VITALS — BP 120/74 | HR 76 | Resp 18 | Ht 65.0 in | Wt 165.6 lb

## 2023-09-02 DIAGNOSIS — Z13220 Encounter for screening for lipoid disorders: Secondary | ICD-10-CM | POA: Diagnosis not present

## 2023-09-02 DIAGNOSIS — Z1329 Encounter for screening for other suspected endocrine disorder: Secondary | ICD-10-CM

## 2023-09-02 DIAGNOSIS — Z130 Encounter for screening for diseases of the blood and blood-forming organs and certain disorders involving the immune mechanism: Secondary | ICD-10-CM | POA: Diagnosis not present

## 2023-09-02 DIAGNOSIS — Z0000 Encounter for general adult medical examination without abnormal findings: Secondary | ICD-10-CM | POA: Diagnosis not present

## 2023-09-02 DIAGNOSIS — R7303 Prediabetes: Secondary | ICD-10-CM | POA: Diagnosis not present

## 2023-09-02 DIAGNOSIS — Z23 Encounter for immunization: Secondary | ICD-10-CM

## 2023-09-02 DIAGNOSIS — E559 Vitamin D deficiency, unspecified: Secondary | ICD-10-CM | POA: Diagnosis not present

## 2023-09-02 DIAGNOSIS — M25552 Pain in left hip: Secondary | ICD-10-CM

## 2023-09-03 ENCOUNTER — Encounter: Payer: Self-pay | Admitting: Family Medicine

## 2023-09-03 LAB — LIPID PANEL
Cholesterol: 189 mg/dL (ref 0–200)
HDL: 60.1 mg/dL (ref >39.00)
LDL Cholesterol: 94 mg/dL (ref 0–99)
NonHDL: 129.3
Total CHOL/HDL Ratio: 3
Triglycerides: 178 mg/dL — ABNORMAL HIGH (ref 0.0–149.0)
VLDL: 35.6 mg/dL (ref 0.0–40.0)

## 2023-09-03 LAB — CBC
HCT: 43.6 % (ref 36.0–46.0)
Hemoglobin: 14.2 g/dL (ref 12.0–15.0)
MCHC: 32.6 g/dL (ref 30.0–36.0)
MCV: 97 fL (ref 78.0–100.0)
Platelets: 293 10*3/uL (ref 150.0–400.0)
RBC: 4.49 Mil/uL (ref 3.87–5.11)
RDW: 12.6 % (ref 11.5–15.5)
WBC: 7.4 10*3/uL (ref 4.0–10.5)

## 2023-09-03 LAB — COMPREHENSIVE METABOLIC PANEL
ALT: 12 U/L (ref 0–35)
AST: 16 U/L (ref 0–37)
Albumin: 4.7 g/dL (ref 3.5–5.2)
Alkaline Phosphatase: 77 U/L (ref 39–117)
BUN: 21 mg/dL (ref 6–23)
CO2: 29 meq/L (ref 19–32)
Calcium: 9.7 mg/dL (ref 8.4–10.5)
Chloride: 101 meq/L (ref 96–112)
Creatinine, Ser: 0.82 mg/dL (ref 0.40–1.20)
GFR: 76.65 mL/min (ref >60.00)
Glucose, Bld: 82 mg/dL (ref 70–99)
Potassium: 4.5 meq/L (ref 3.5–5.1)
Sodium: 138 meq/L (ref 135–145)
Total Bilirubin: 0.4 mg/dL (ref 0.2–1.2)
Total Protein: 6.9 g/dL (ref 6.0–8.3)

## 2023-09-03 LAB — VITAMIN D 25 HYDROXY (VIT D DEFICIENCY, FRACTURES): VITD: 44.64 ng/mL (ref 30.00–100.00)

## 2023-09-03 LAB — TSH: TSH: 1.86 u[IU]/mL (ref 0.35–5.50)

## 2023-09-03 LAB — HEMOGLOBIN A1C: Hgb A1c MFr Bld: 5.8 % (ref 4.6–6.5)

## 2023-09-15 ENCOUNTER — Encounter: Payer: Self-pay | Admitting: Family Medicine

## 2023-10-14 ENCOUNTER — Encounter (INDEPENDENT_AMBULATORY_CARE_PROVIDER_SITE_OTHER): Payer: 59 | Admitting: Family Medicine

## 2023-10-14 DIAGNOSIS — F4323 Adjustment disorder with mixed anxiety and depressed mood: Secondary | ICD-10-CM

## 2023-10-15 DIAGNOSIS — F4323 Adjustment disorder with mixed anxiety and depressed mood: Secondary | ICD-10-CM

## 2023-10-16 MED ORDER — FLUOXETINE HCL 20 MG PO CAPS: 20.0000 mg | ORAL_CAPSULE | Freq: Every day | ORAL | 3 refills | Status: DC

## 2023-10-16 NOTE — Telephone Encounter (Signed)

## 2023-10-16 NOTE — Addendum Note (Signed)
Addended by: Abbe Amsterdam C on: 10/16/2023 02:11 PM   Modules accepted: Orders

## 2023-11-09 MED ORDER — FLUOXETINE HCL 40 MG PO CAPS: 40.0000 mg | ORAL_CAPSULE | Freq: Every day | ORAL | 3 refills | Status: DC

## 2023-11-09 NOTE — Addendum Note (Signed)
 Addended by: Abbe Amsterdam C on: 11/09/2023 04:21 PM   Modules accepted: Orders

## 2023-12-10 ENCOUNTER — Ambulatory Visit: Payer: Commercial Managed Care - HMO | Admitting: Family Medicine

## 2023-12-10 ENCOUNTER — Telehealth: Payer: Commercial Managed Care - HMO | Admitting: Emergency Medicine

## 2023-12-10 ENCOUNTER — Encounter: Payer: Self-pay | Admitting: Family Medicine

## 2023-12-10 ENCOUNTER — Ambulatory Visit: Payer: Self-pay | Admitting: Family Medicine

## 2023-12-10 VITALS — BP 118/74 | HR 70 | Temp 98.2°F | Wt 163.0 lb

## 2023-12-10 DIAGNOSIS — K529 Noninfective gastroenteritis and colitis, unspecified: Secondary | ICD-10-CM

## 2023-12-10 DIAGNOSIS — R197 Diarrhea, unspecified: Secondary | ICD-10-CM

## 2023-12-10 DIAGNOSIS — K580 Irritable bowel syndrome with diarrhea: Secondary | ICD-10-CM | POA: Diagnosis not present

## 2023-12-10 MED ORDER — ONDANSETRON 4 MG PO TBDP: 4.0000 mg | ORAL_TABLET | Freq: Three times a day (TID) | ORAL | 0 refills | Status: DC | PRN

## 2023-12-10 MED ORDER — DICYCLOMINE HCL 20 MG PO TABS: 10.0000 mg | ORAL_TABLET | Freq: Three times a day (TID) | ORAL | 0 refills | Status: DC

## 2023-12-10 NOTE — Progress Notes (Signed)
  Because of the severity of your symptoms and concern for dehydration. You've already tried the treatment we would recommend (imodium) through e-visits.  Because this isn't working and you are feeling weak, I feel your condition warrants further evaluation and I recommend that you be seen in a face-to-face visit.   NOTE: There will be NO CHARGE for this E-Visit   If you are having a true medical emergency, please call 911.     For an urgent face to face visit, [facility] has multiple urgent care centers for your convenience.  Click the link below for the full list of locations and hours, walk-in wait times, appointment scheduling options and driving directions:  Urgent Care - [place], [place], [place], [place], [place], [place]  [place]     Your MyChart E-visit questionnaire answers were reviewed by a board certified advanced clinical practitioner to complete your personal care plan based on your specific symptoms.    Thank you for using e-Visits.  Approximately 5 minutes was used in reviewing the patient's chart, questionnaire, prescribing medications, and documentation.

## 2023-12-10 NOTE — Patient Instructions (Signed)

## 2023-12-10 NOTE — Progress Notes (Signed)
 Katrina Ford , 04/10/1961, 63 y.o., female MRN: [id number] Patient Care Team    Relationship Specialty Notifications Start End  Copland, Harlene JAYSON, MD PCP - General Family Medicine  12/03/16     Chief Complaint  Patient presents with   Diarrhea    Started Monday; stomach pain, fatigue. Very loose stools. Pt was taking imodium, not helping anymore.      Subjective: Katrina Ford is a 63 y.o. Pt presents for an OV with complaints of diarrhea of 5 days duration.  Associated symptoms include nausea.  Patient reports she has been going to the bathroom up to 8 times a day, when not using the Imodium.  Imodium will help her for a few hours, and then the diarrhea returns. She does endorse chills at the onset of symptoms on Monday.  She is fatigued.  She is tolerating p.o. she reports discomfort in the upper portion of her abdomen.  She is prescribed PPI long-term. Colonoscopy UTD 10/12/2022.  Patient with history of irritable bowel syndrome.  Patient has a history of Sjogren's syndrome. No recent travel or antibiotic use TSH normal 08/2023 She denies fever or blood per rectum. She denies recent travel or exposure to any undercooked food     09/02/2023    1:05 PM 06/10/2022    9:22 AM 05/30/2021    1:08 PM 08/19/2015    9:08 AM 01/05/2014   10:18 AM  Depression screen PHQ 2/9  Decreased Interest 0 0 0 0 2  Down, Depressed, Hopeless 1 0 0 0 2  PHQ - 2 Score 1 0 0 0 4  Altered sleeping     2  Tired, decreased energy     2  Change in appetite     2  Feeling bad or failure about yourself      2  Trouble concentrating     2  Moving slowly or fidgety/restless     1  Suicidal thoughts     0  PHQ-9 Score     15    No Known Allergies Social History   Social History Narrative   Not on file   Past Medical History:  Diagnosis Date   Chronic headache    Depression    Elevated hemoglobin A1c    pre diabetic   Family history of malignant neoplasm of gastrointestinal tract     Frozen shoulder syndrome    Gastritis    GERD (gastroesophageal reflux disease)    Hiatal hernia    IBS (irritable bowel syndrome)    Kidney stones    Osteoporosis    Sciatic leg pain    May 2018   Sjogren's disease (HCC)    Sjogren's syndrome (HCC) 06/27/2018   With inflammatory arthritis. [facility] Rheumatology Dr. Ishmael    Sleep apnea    Past Surgical History:  Procedure Laterality Date   CHOLECYSTECTOMY  2000   ESOPHAGEAL MANOMETRY  11/28/2012   Procedure: ESOPHAGEAL MANOMETRY (EM);  Surgeon: Alm JONELLE Gander, MD;  Location: [facility];  Service: Endoscopy;  Laterality: N/A;   FOOT SURGERY Bilateral 03/2013   LITHOTRIPSY  2003&2012   TONSILLECTOMY     Family History  Problem Relation Age of Onset   Diabetes Mother    Heart disease Mother    Diverticulitis Mother    Hyperlipidemia Mother    Hyperlipidemia Father    Cancer Father        skin cancer   Hyperlipidemia Sister  Hyperlipidemia Brother    Colon cancer Maternal Uncle 73       Passed away in 10/11/2009   Diabetes Maternal Uncle    Heart disease Maternal Uncle    Breast cancer Paternal Aunt    Colon cancer Paternal Uncle    Heart disease Paternal Grandmother        Siblings also   Fibromyalgia Cousin    Fibromyalgia Cousin    Fibromyalgia Cousin    Lupus Cousin    Lupus Cousin    Esophageal cancer Neg Hx    Stomach cancer Neg Hx    Rectal cancer Neg Hx    Allergies as of 12/10/2023   No Known Allergies      Medication List        Accurate as of December 10, 2023  1:47 PM. If you have any questions, ask your nurse or doctor.          STOP taking these medications    Estradiol 10 MCG Tabs vaginal tablet Stopped by: Charlies Bellini   HYDROcodone  bit-homatropine 5-1.5 MG/5ML syrup Commonly known as: HYCODAN Stopped by: Charlies Bellini       TAKE these medications    celecoxib 200 MG capsule Commonly known as: CELEBREX Take 200 mg by mouth daily.   dicyclomine  20 MG tablet Commonly known  as: BENTYL  Take 0.5-1 tablets (10-20 mg total) by mouth 4 (four) times daily -  before meals and at bedtime. Started by: Charlies Bellini   FLUoxetine  40 MG capsule Commonly known as: PROZAC  Take 1 capsule (40 mg total) by mouth daily.   folic acid 1 MG tablet Commonly known as: FOLVITE Take 2 mg by mouth daily.   hydroxychloroquine 200 MG tablet Commonly known as: PLAQUENIL 400 mg daily.   lansoprazole  30 MG capsule Commonly known as: PREVACID  Take 1 capsule (30 mg total) by mouth in the morning.   magnesium  84 MG ( ) Tbcr SR tablet Commonly known as: MAGTAB Take 84 mg by mouth.   methotrexate 2.5 MG tablet Commonly known as: RHEUMATREX TK 6 TS PO 1 TIME A WK   ondansetron  4 MG disintegrating tablet Commonly known as: ZOFRAN -ODT Take 1 tablet (4 mg total) by mouth every 8 (eight) hours as needed for nausea or vomiting. Started by: Hailley Byers   traZODone  50 MG tablet Commonly known as: DESYREL  Take 1-2 tablets (50-100 mg total) by mouth at bedtime as needed for sleep.   vitamin B-12 100 MCG tablet Commonly known as: CYANOCOBALAMIN  Take 100 mcg by mouth daily.   Vitamin D3 125 MCG (5000 UT) Caps Take 1 capsule by mouth daily.   VITAMIN K2-VITAMIN D3 PO Take by mouth.        All past medical history, surgical history, allergies, family history, immunizations andmedications were updated in the EMR today and reviewed under the history and medication portions of their EMR.     ROS Negative, with the exception of above mentioned in HPI   Objective:  BP 118/74   Pulse 70   Temp 98.2 F (36.8 C)   Wt 163 lb (73.9 kg)   SpO2 95%   BMI 27.12 kg/m  Body mass index is 27.12 kg/m. Physical Exam Vitals and nursing note reviewed.  Constitutional:      General: She is not in acute distress.    Appearance: Normal appearance. She is normal weight. She is not ill-appearing or toxic-appearing.  HENT:     Head: Normocephalic and atraumatic.  Eyes:     General:  No scleral icterus.       Right eye: No discharge.        Left eye: No discharge.     Extraocular Movements: Extraocular movements intact.     Conjunctiva/sclera: Conjunctivae normal.     Pupils: Pupils are equal, round, and reactive to light.  Cardiovascular:     Rate and Rhythm: Normal rate and regular rhythm.  Pulmonary:     Effort: Pulmonary effort is normal. No respiratory distress.     Breath sounds: Normal breath sounds. No wheezing, rhonchi or rales.     Comments: Mild cough present Abdominal:     General: Abdomen is flat. There is no distension.     Palpations: Abdomen is soft.     Tenderness: There is no abdominal tenderness. There is no guarding or rebound.     Comments: Mild hyperactivity present  Skin:    Findings: No rash.  Neurological:     Mental Status: She is alert and oriented to person, place, and time. Mental status is at baseline.     Motor: No weakness.     Coordination: Coordination normal.     Gait: Gait normal.  Psychiatric:        Mood and Affect: Mood normal.        Behavior: Behavior normal.        Thought Content: Thought content normal.        Judgment: Judgment normal.     No results found. No results found. No results found for this or any previous visit (from the past 24 hours).  Assessment/Plan: Katrina Ford is a 63 y.o. female present for OV for  Gastroenteritis (Primary)/IBS h/o Rest.  Hydrate.  Encouraged her to not only drink water but electrolyte replacement drink that is low in sugar. Avoid dairy, greasy or spicy foods, sticking to a bland soft diet for now. Would avoid Imodium use if at all possible. Bentyl  3 times daily AC prescribed Zofran  every 8 hours prescribed We discussed the natural course of gastroenteritis. She should start to feel better over the weekend or by Monday, if symptoms are worsening she would need to be seen by her PCP for further evaluation and possible stool studies.  Reviewed expectations re: course  of current medical issues. Discussed self-management of symptoms. Outlined signs and symptoms indicating need for more acute intervention. Patient verbalized understanding and all questions were answered. Patient received an After-Visit Summary.    No orders of the defined types were placed in this encounter.  Meds ordered this encounter  Medications   dicyclomine  (BENTYL ) 20 MG tablet    Sig: Take 0.5-1 tablets (10-20 mg total) by mouth 4 (four) times daily -  before meals and at bedtime.    Dispense:  120 tablet    Refill:  0   ondansetron  (ZOFRAN -ODT) 4 MG disintegrating tablet    Sig: Take 1 tablet (4 mg total) by mouth every 8 (eight) hours as needed for nausea or vomiting.    Dispense:  20 tablet    Refill:  0   Referral Orders  No referral(s) requested today     Note is dictated utilizing voice recognition software. Although note has been proof read prior to signing, occasional typographical errors still can be missed. If any questions arise, please do not hesitate to call for verification.   electronically signed by:  Charlies Bellini, DO  [facility] [place]

## 2023-12-10 NOTE — Telephone Encounter (Signed)
 Chief Complaint: diarrhea Symptoms: diarrhea, fever, nausea, epigastric pain, cough Frequency: 5 days Pertinent Negatives: Patient denies CP, SOB, vomiting, pain with urination Disposition: [] ED /[] Urgent Care (no appt availability in office) / [x] Appointment(In office/virtual)/ []  [facility]/ [] Home Care/ [] Refused Recommended Disposition /[] [facility]/ []  Follow-up with PCP Additional Notes: Patient calls reporting watery diarrhea x 5 days. She states as soon as she eats or drinks, she has diarrhea. Reports nausea, epigastric pain relieved after BM, cough, fever. Per protocol, patient to be evaluated within 4 hours. No availability with PCP or in clinic with any provider during that time frame. Patient agreeable to evaluation at alternative location. Scheduled with [facility] for today at 1300. Care advice reviewed, patient verbalized understanding. Alerting PCP for review.   Copied from CRM [id number]. Topic: Clinical - Pink Word Triage >> Dec 10, 2023  9:59 AM Katrina Ford wrote: Patient/patient representative is calling to schedule an appointment. Refer to attachments for appointment information.  Patient has started with low grade fever, diarrhea, coughing, bodyaches, pain in uppper quadrant of the stomach,nauseated, had virtual visit this morning and was advised to seek an appointment however, there's no avail. At this time. Reason for Disposition  [1] SEVERE diarrhea (e.g., 7 or more times / day more than normal) AND [2] age > 60 years  Answer Assessment - Initial Assessment Questions 1. DIARRHEA SEVERITY: How bad is the diarrhea? How many more stools have you had in the past 24 hours than normal?    - NO DIARRHEA (SCALE 0)   - MILD (SCALE 1-3): Few loose or mushy BMs; increase of 1-3 stools over normal daily number of stools; mild increase in ostomy output.   -  MODERATE (SCALE 4-7): Increase of 4-6 stools daily over normal; moderate increase in ostomy output.    -  SEVERE (SCALE 8-10; OR WORST POSSIBLE): Increase of 7 or more stools daily over normal; moderate increase in ostomy output; incontinence.     Severe- states it is constant. 2. ONSET: When did the diarrhea begin?      5 days ago 3. BM CONSISTENCY: How loose or watery is the diarrhea?      Watery all week long. 4. VOMITING: Are you also vomiting? If Yes, ask: How many times in the past 24 hours?      Denies vomiting, but is nauseated. 5. ABDOMEN PAIN: Are you having any abdomen pain? If Yes, ask: What does it feel like? (e.g., crampy, dull, intermittent, constant)      Yes, epigastric- described as constant ache. Improves after bowel movement 6. ABDOMEN PAIN SEVERITY: If present, ask: How bad is the pain?  (e.g., Scale 1-10; mild, moderate, or severe)   - MILD (1-3): doesn't interfere with normal activities, abdomen soft and not tender to touch    - MODERATE (4-7): interferes with normal activities or awakens from sleep, abdomen tender to touch    - SEVERE (8-10): excruciating pain, doubled over, unable to do any normal activities       5/10 7. ORAL INTAKE: If vomiting, Have you been able to drink liquids? How much liquids have you had in the past 24 hours?     States she can hold liquids- states she has increased her oral intake 8. HYDRATION: Any signs of dehydration? (e.g., dry mouth [not just dry lips], too weak to stand, dizziness, new weight loss) When did you last urinate?     Dry lips this morning, states feels better than yesterday. Decreased  urinary output. 9. EXPOSURE: Have you traveled to a foreign country recently? Have you been exposed to anyone with diarrhea? Could you have eaten any food that was spoiled?     Denies 10. ANTIBIOTIC USE: Are you taking antibiotics now or have you taken antibiotics in the past 2 months?       Denies 11. OTHER SYMPTOMS: Do you have any other symptoms? (e.g., fever, blood in stool)       Cough, congestion, fever  (99.68F), nausea  Protocols used: Diarrhea-A-AH

## 2023-12-30 ENCOUNTER — Telehealth: Payer: Self-pay | Admitting: Internal Medicine

## 2023-12-30 NOTE — Telephone Encounter (Signed)
 Pt scheduled to see Dr Rhea Belton 03/08/24 @1 :50pm. Pt aware of appt.

## 2023-12-30 NOTE — Telephone Encounter (Signed)
 Inbound call from patient stating she is having complications with GERD. States previous medications that were prescribed previously have not been helping. Requesting a call back. Please advise, thank you.

## 2024-01-07 LAB — HM MAMMOGRAPHY

## 2024-01-10 ENCOUNTER — Encounter: Payer: Self-pay | Admitting: Family Medicine

## 2024-03-08 ENCOUNTER — Encounter: Payer: Self-pay | Admitting: Internal Medicine

## 2024-03-08 ENCOUNTER — Other Ambulatory Visit: Payer: Self-pay | Admitting: *Deleted

## 2024-03-08 ENCOUNTER — Ambulatory Visit: Payer: Commercial Managed Care - HMO | Admitting: Internal Medicine

## 2024-03-08 VITALS — BP 120/64 | HR 78 | Ht 65.0 in | Wt 163.0 lb

## 2024-03-08 DIAGNOSIS — R1011 Right upper quadrant pain: Secondary | ICD-10-CM

## 2024-03-08 DIAGNOSIS — K219 Gastro-esophageal reflux disease without esophagitis: Secondary | ICD-10-CM

## 2024-03-08 DIAGNOSIS — K580 Irritable bowel syndrome with diarrhea: Secondary | ICD-10-CM | POA: Diagnosis not present

## 2024-03-08 MED ORDER — DICYCLOMINE HCL 20 MG PO TABS: 10.0000 mg | ORAL_TABLET | Freq: Three times a day (TID) | ORAL | 5 refills | Status: AC | PRN

## 2024-03-08 NOTE — Progress Notes (Signed)
 Katrina Ford [id number] 1961/10/30   Chief Complaint: IBS-D, GERD, abdominal pain   Referring Provider: Kaylee Partridge, MD Primary GI MD: Dr. Bridgett Camps  HPI: Katrina Ford is a 63 y.o. female with past medical history of nonerosive GERD, IBS with diarrhea, Sjogren's syndrome on methotrexate and Plaquenil, fibromyalgia on Celebrex, kidney stones, osteoporosis, cholecystectomy who presents today for a complaint of alternating constipation/diarrhea, bloating, and RUQ pain.    Last seen in the office 03/24/2023 by Dr. Bridgett Camps for follow-up of GERD, IBS with diarrhea.  At that time she reported symptoms had improved on lansoprazole  milligrams daily, and she was only requiring Pepcid in the evening on an as-needed basis.  She is taking rifaximin  for IBS and this had helped significantly with her loose stools, though she did have some recurrence of loose stools with urgency and cramping.  Is advised to continue PPI, famotidine as needed, and Imodium as needed for diarrhea.  Lotronex was to be considered for worsening symptoms.  She is not due for repeat screening colonoscopy until 2033.  Patient states today that she continues to have problems with frequent loose stools.  On average, she has bowel movements 5-6 times a day and stool is often "like mud" and very rarely formed.  Constipation is very rare for her.  In February she had a 2-week long episode of watery diarrhea.  States that she had 20 bowel movements on 1 particular day.  She felt very dehydrated, noticed that her eyes, nose, and skin were all dry.  She went to see her PCP who encouraged her to increase oral hydration, and gave her prescriptions for Bentyl  and Zofran .  They did not test her stool for infection, but it was thought she may have had gastroenteritis.  States the Bentyl  does seem to help.  Imodium does not work for her at all.  This diarrheal episode did improve, but she is still not having formed stools.  She only took  Zofran  a couple of times, no longer taking this.  She has been taking lansoprazole  30 mg daily, but is unsure if this is helping as she continues to have breakthrough acid reflux every other day.  When she has breakthrough symptoms she takes Pepcid, and has been having to take this pretty often.  When she was feeling sick in February her reflux worsened.  She has stopped drinking caffeine, which has somewhat improved the acid reflux.  Patient reports a constant RUQ abdominal pain which has been ongoing for about 3 to 4 months.  It became very bothersome during her diarrheal episode in February and has subsided some since that time, but is still present daily.  She describes the pain as achy, and feels it just under her ribs on the right side.  She has history of cholecystectomy over 10 years ago.  Had blood work with rheumatologist last month, normal per patient.   Previous GI Procedures   Colonoscopy 10/08/2022 - Entire colon normal. - Recall 10 years.  Esophageal manometry 12/07/2012 - Normal, no evidence of esophageal motility disorder  EGD 2011 - Normal  EGD 2008 - Hiatal hernia, probable GERD, cold stricture dilated  Past Medical History:  Diagnosis Date   Chronic headache    Depression    Elevated hemoglobin A1c    pre diabetic   Family history of malignant neoplasm of gastrointestinal tract    Frozen shoulder syndrome    Gastritis    GERD (gastroesophageal reflux disease)    Hiatal hernia  IBS (irritable bowel syndrome)    Kidney stones    Osteoporosis    Sciatic leg pain    Mar 24, 2017  Sjogren's disease [facility])    Sjogren's syndrome (HCC) 06/27/2018   With inflammatory arthritis. [facility] Rheumat[facility] Dr. Meredith Stalls    Sleep apnea     Past Surgical History:  Procedure Laterality Date   CHOLECYSTECTOMY  03-25-1999   ESOPHAGEAL MANOMETRY  11/28/2012   Procedure: ESOPHAGEAL MANOMETRY (EM);  Surgeon: Tessa Figures, MD;  Location: [facility];  Service: Endoscopy;   Laterality: N/A;   FOOT SURGERY Bilateral 2013-03-24   LITHOTRIPSY  2003&2012   TONSILLECTOMY      Current Outpatient Medications  Medication Sig Dispense Refill   celecoxib (CELEBREX) 200 MG capsule Take 200 mg by mouth daily.     Cholecalciferol (VITAMIN D3) 125 MCG (5000 UT) CAPS Take 1 capsule by mouth daily.     dicyclomine  (BENTYL ) 20 MG tablet Take 0.5-1 tablets (10-20 mg total) by mouth 4 (four) times daily -  before meals and at bedtime. 120 tablet 0   FLUoxetine  (PROZAC ) 40 MG capsule Take 1 capsule (40 mg total) by mouth daily. 90 capsule 3   folic acid (FOLVITE) 1 MG tablet Take 2 mg by mouth daily.   6   hydroxychloroquine (PLAQUENIL) 200 MG tablet 400 mg daily.      lansoprazole  (PREVACID ) 30 MG capsule Take 1 capsule (30 mg total) by mouth in the morning. 90 capsule 1   magnesium  (MAGTAB) 84 MG ( ) TBCR SR tablet Take 84 mg by mouth.     methotrexate (RHEUMATREX) 2.5 MG tablet TK 6 TS PO 1 TIME A WK  6   ondansetron  (ZOFRAN -ODT) 4 MG disintegrating tablet Take 1 tablet (4 mg total) by mouth every 8 (eight) hours as needed for nausea or vomiting. 20 tablet 0   traZODone  (DESYREL ) 50 MG tablet Take 1-2 tablets (50-100 mg total) by mouth at bedtime as needed for sleep. 180 tablet 0   vitamin B-12 (CYANOCOBALAMIN) 100 MCG tablet Take 100 mcg by mouth daily.     Vitamin D -Vitamin K (VITAMIN K2-VITAMIN D3 PO) Take by mouth.     No current facility-administered medications for this visit.    Allergies as of 03/08/2024   (No Known Allergies)    Family History  Problem Relation Age of Onset   Diabetes Mother    Heart disease Mother    Diverticulitis Mother    Hyperlipidemia Mother    Hyperlipidemia Father    Cancer Father        skin cancer   Hyperlipidemia Sister    Hyperlipidemia Brother    Colon cancer Maternal Uncle 55       Passed away in 03/24/2009   Diabetes Maternal Uncle    Heart disease Maternal Uncle    Breast cancer Paternal Aunt    Colon cancer Paternal Uncle     Heart disease Paternal Grandmother        Siblings also   Fibromyalgia Cousin    Fibromyalgia Cousin    Fibromyalgia Cousin    Lupus Cousin    Lupus Cousin    Esophageal cancer Neg Hx    Stomach cancer Neg Hx    Rectal cancer Neg Hx     Social History   Tobacco Use   Smoking status: Never   Smokeless tobacco: Never  Vaping Use   Vaping status: Never Used  Substance Use Topics   Alcohol use: No   Drug use: No  Review of Systems:    Constitutional: No weight loss, fever, chills, weakness or fatigue HEENT: Eyes: No change in vision Ears, Nose, Throat:  No change in hearing or congestion Skin: No rash or itching Cardiovascular: No chest pain, chest pressure or palpitations   Respiratory: No SOB or cough Gastrointestinal: See HPI and otherwise negative Genitourinary: No dysuria or change in urinary frequency Neurological: No headache, dizziness or syncope Musculoskeletal: No new muscle or joint pain Hematologic: No bleeding or bruising    Physical Exam:  Vital signs: BP 120/64 (BP Location: Left Arm, Patient Position: Sitting, Cuff Size: Normal)   Pulse 78   Ht 5\' 5"  (1.651 m)   Wt 163 lb (73.9 kg)   SpO2 98%   BMI 27.12 kg/m    Constitutional: NAD, Well developed, Well nourished, alert and cooperative Head:  Normocephalic and atraumatic. Eyes:  EOMs intact. No scleral icterus. Conjunctiva pink. Respiratory: Respirations even and unlabored. Lungs clear to auscultation bilaterally.  No wheezes, crackles, or rhonchi.  Cardiovascular:  Regular rate and rhythm. No peripheral edema, cyanosis or pallor.  Gastrointestinal:  Soft, nondistended, tender to palpation of RUQ. No rebound or guarding. Normal bowel sounds. No appreciable masses or hepatomegaly. Rectal:  Not performed.  Msk:  Symmetrical without gross deformities. Without edema, no deformity or joint abnormality.  Neurologic:  Alert and oriented x4;  grossly normal neurologically.  Skin:   Dry and intact  without significant lesions or rashes. Psychiatric: Oriented to person, place and time. Demonstrates good judgement and reason without abnormal affect or behaviors.   RELEVANT LABS AND IMAGING: CBC    Component Value Date/Time   WBC 7.4 09/02/2023 1331   RBC 4.49 09/02/2023 1331   HGB 14.2 09/02/2023 1331   HCT 43.6 09/02/2023 1331   PLT 293.0 09/02/2023 1331   MCV 97.0 09/02/2023 1331   MCH 31.1 05/31/2013 2358   MCHC 32.6 09/02/2023 1331   RDW 12.6 09/02/2023 1331   LYMPHSABS 1.8 08/19/2015 0945   MONOABS 0.4 08/19/2015 0945   EOSABS 0.1 08/19/2015 0945   BASOSABS 0.0 08/19/2015 0945    CMP     Component Value Date/Time   NA 138 09/02/2023 1331   NA 141 01/06/2017 0000   K 4.5 09/02/2023 1331   CL 101 09/02/2023 1331   CO2 29 09/02/2023 1331   GLUCOSE 82 09/02/2023 1331   BUN 21 09/02/2023 1331   BUN 15 01/06/2017 0000   CREATININE 0.82 09/02/2023 1331   CALCIUM 9.7 09/02/2023 1331   PROT 6.9 09/02/2023 1331   ALBUMIN 4.7 09/02/2023 1331   AST 16 09/02/2023 1331   ALT 12 09/02/2023 1331   ALKPHOS 77 09/02/2023 1331   BILITOT 0.4 09/02/2023 1331   GFRNONAA 72 (L) 11/04/2013 1420   GFRAA 84 (L) 11/04/2013 1420     Assessment/Plan:   GERD RUQ Abdominal pain Acid reflux is better but still not well-controlled on lansoprazole  30 mg daily and Pepcid as needed, patient having breakthrough symptoms every other day.  Also having persistent RUQ abdominal pain ongoing for the last 3 to 4 months. She has not had an EGD since 2011.  -Plan for EGD for PPI refractory reflux symptoms, check biopsies for celiac disease, rule out PUD.  - After EGD and depending on findings, consider switching from lansoprazole  to Dexilant or Voquenza.  - Continue lansoprazole  30mg  daily and Pepcid prn - If no cause of RUQ abdominal pain is identified on EGD, consider cross-sectional imaging for further work-up.  IBS with diarrhea  Patient continues to have frequent loose stools, averaging 5-6  bowel movements per day.  Episode of watery diarrhea in February lasting 2 weeks (question gastroenteritis versus IBS).  Some improvement with Bentyl . No improvement with imodium. - Check fecal calprotectin to rule out inflammatory diarrhea. If negative, can consider trial of a bile acid sequestrant given history of cholecystectomy. - Check fecal elastase to evaluate for EPI. - Start Bentyl  20mg  TID as needed for diarrhea/cramping.   Valiant Gaul, PA-C [facility] 03/08/2024, 1:39 PM  Patient Care Team: Copland, Skipper Dumas, MD as PCP - General (Family Medicine)   Addendum: I personally saw the patient and performed a substantive portion of this encounter (>50% time spent), including a complete performance of at least one of the key components (MDM, Hx and/or Exam), in conjunction with the APP.  I agree with the APP's note, impression, and recommendations with additional input as follows.  JMP

## 2024-03-08 NOTE — Patient Instructions (Addendum)
 Your provider has requested that you go to the basement level for lab work before leaving today. Press "B" on the elevator. The lab is located at the first door on the left as you exit the elevator.  Continue lansoprazole  and pepcid.   We have sent the following medications to your pharmacy for you to pick up at your convenience: bentyl .   You have been scheduled for an endoscopy. Please follow written instructions given to you at your visit today.  If you use inhalers (even only as needed), please bring them with you on the day of your procedure.  If you take any of the following medications, they will need to be adjusted prior to your procedure:   DO NOT TAKE 7 DAYS PRIOR TO TEST- Trulicity (dulaglutide) Ozempic, Wegovy (semaglutide) Mounjaro (tirzepatide) Bydureon Bcise (exanatide extended release)  DO NOT TAKE 1 DAY PRIOR TO YOUR TEST Rybelsus (semaglutide) Adlyxin (lixisenatide) Victoza (liraglutide) Byetta (exanatide) _____________________________________________________________    If your blood pressure at your visit was 140/90 or greater, please contact your primary care physician to follow up on this.  _______________________________________________________  If you are age 61 or older, your body mass index should be between 23-30. Your Body mass index is 27.12 kg/m. If this is out of the aforementioned range listed, please consider follow up with your Primary Care Provider.  If you are age 21 or younger, your body mass index should be between 19-25. Your Body mass index is 27.12 kg/m. If this is out of the aformentioned range listed, please consider follow up with your Primary Care Provider.   ________________________________________________________  The [facility] providers would like to encourage you to use MYCHART to communicate with providers for non-urgent requests or questions.  Due to long hold times on the telephone, sending your provider a message by [facility]  may be a faster and more efficient way to get a response.  Please allow 48 business hours for a response.  Please remember that this is for non-urgent requests.  _______________________________________________________

## 2024-03-09 ENCOUNTER — Encounter: Payer: Self-pay | Admitting: Internal Medicine

## 2024-03-10 ENCOUNTER — Other Ambulatory Visit

## 2024-03-10 DIAGNOSIS — K580 Irritable bowel syndrome with diarrhea: Secondary | ICD-10-CM

## 2024-03-10 DIAGNOSIS — K219 Gastro-esophageal reflux disease without esophagitis: Secondary | ICD-10-CM

## 2024-03-10 DIAGNOSIS — R1011 Right upper quadrant pain: Secondary | ICD-10-CM

## 2024-03-12 LAB — CALPROTECTIN, FECAL: Calprotectin, Fecal: 61 ug/g (ref 0–120)

## 2024-03-13 ENCOUNTER — Encounter: Payer: Self-pay | Admitting: Internal Medicine

## 2024-03-14 ENCOUNTER — Encounter: Payer: Self-pay | Admitting: Internal Medicine

## 2024-03-14 ENCOUNTER — Ambulatory Visit: Admitting: Internal Medicine

## 2024-03-14 VITALS — BP 131/67 | HR 62 | Temp 98.0°F | Resp 17 | Ht 65.0 in | Wt 163.0 lb

## 2024-03-14 DIAGNOSIS — R1011 Right upper quadrant pain: Secondary | ICD-10-CM

## 2024-03-14 DIAGNOSIS — K317 Polyp of stomach and duodenum: Secondary | ICD-10-CM

## 2024-03-14 DIAGNOSIS — K2289 Other specified disease of esophagus: Secondary | ICD-10-CM

## 2024-03-14 DIAGNOSIS — K219 Gastro-esophageal reflux disease without esophagitis: Secondary | ICD-10-CM

## 2024-03-14 DIAGNOSIS — K319 Disease of stomach and duodenum, unspecified: Secondary | ICD-10-CM

## 2024-03-14 DIAGNOSIS — R195 Other fecal abnormalities: Secondary | ICD-10-CM

## 2024-03-14 MED ORDER — SODIUM CHLORIDE 0.9 % IV SOLN: 500.0000 mL | Freq: Once | INTRAVENOUS | Status: DC

## 2024-03-14 NOTE — Progress Notes (Signed)
 Pt's states no medical or surgical changes since previsit or office visit.

## 2024-03-14 NOTE — Progress Notes (Signed)
 Sedate, gd SR, tolerated procedure well, VSS, report to RN

## 2024-03-14 NOTE — Progress Notes (Signed)
 See office note dated 03/08/2024 for details and current H&P  Patient presenting for evaluation of GERD and right upper quadrant abdominal pain despite lansoprazole  30 mg daily and as needed Pepcid.  Frequent breakthrough heartburn symptoms.  Also with frequent loose stools.

## 2024-03-14 NOTE — Patient Instructions (Signed)
  Resume previous diet Continue present medication Await pathology results Office will contact you for an MRI abdomen with contrast    YOU HAD AN ENDOSCOPIC PROCEDURE TODAY AT THE [facility]:   Refer to the procedure report that was given to you for any specific questions about what was found during the examination.  If the procedure report does not answer your questions, please call your gastroenterologist to clarify.  If you requested that your care partner not be given the details of your procedure findings, then the procedure report has been included in a sealed envelope for you to review at your convenience later.  YOU SHOULD EXPECT: Some feelings of bloating in the abdomen. Passage of more gas than usual.  Walking can help get rid of the air that was put into your GI tract during the procedure and reduce the bloating. If you had a lower endoscopy (such as a colonoscopy or flexible sigmoidoscopy) you may notice spotting of blood in your stool or on the toilet paper. If you underwent a bowel prep for your procedure, you may not have a normal bowel movement for a few days.  Please Note:  You might notice some irritation and congestion in your nose or some drainage.  This is from the oxygen used during your procedure.  There is no need for concern and it should clear up in a day or so.  SYMPTOMS TO REPORT IMMEDIATELY:   Following upper endoscopy (EGD)  Vomiting of blood or coffee ground material  New chest pain or pain under the shoulder blades  Painful or persistently difficult swallowing  New shortness of breath  Fever of 100F or higher  Black, tarry-looking stools  For urgent or emergent issues, a gastroenterologist can be reached at any hour by calling [phone]. Do not use MyChart messaging for urgent concerns.    DIET:  We do recommend a small meal at first, but then you may proceed to your regular diet.  Drink plenty of fluids but you should avoid alcoholic  beverages for 24 hours.  ACTIVITY:  You should plan to take it easy for the rest of today and you should NOT DRIVE or use heavy machinery until tomorrow (because of the sedation medicines used during the test).    FOLLOW UP: Our staff will call the number listed on your records the next business day following your procedure.  We will call around 7:15- 8:00 am to check on you and address any questions or concerns that you may have regarding the information given to you following your procedure. If we do not reach you, we will leave a message.     If any biopsies were taken you will be contacted by phone or by letter within the next 1-3 weeks.  Please call us  at [phone] if you have not heard about the biopsies in 3 weeks.    SIGNATURES/CONFIDENTIALITY: You and/or your care partner have signed paperwork which will be entered into your electronic medical record.  These signatures attest to the fact that that the information above on your After Visit Summary has been reviewed and is understood.  Full responsibility of the confidentiality of this discharge information lies with you and/or your care-partner.

## 2024-03-14 NOTE — Progress Notes (Signed)
 Called to room to assist during endoscopic procedure.  Patient ID and intended procedure confirmed with present staff. Received instructions for my participation in the procedure from the performing physician.

## 2024-03-14 NOTE — Op Note (Signed)
  Endoscopy Center Patient Name: Katrina Ford Procedure Date: 03/14/2024 3:58 PM MRN: [id number] Endoscopist: Nannette Babe , MD, [id number] Age: 63 Referring MD:  Date of Birth: [birth year] Gender: Female Account #: [id number] Procedure:                Upper GI endoscopy Indications:              Abdominal pain in the right upper quadrant,                            Heartburn, Gastro-esophageal reflux disease,                            Diarrhea -- GERD symptoms despite lansoprazole  30                            mg 1/day and famotidine most evenings Medicines:                Monitored Anesthesia Care Procedure:                Pre-Anesthesia Assessment:                           - Prior to the procedure, a History and Physical                            was performed, and patient medications and                            allergies were reviewed. The patient's tolerance of                            previous anesthesia was also reviewed. The risks                            and benefits of the procedure and the sedation                            options and risks were discussed with the patient.                            All questions were answered, and informed consent                            was obtained. Prior Anticoagulants: The patient has                            taken no anticoagulant or antiplatelet agents. ASA                            Grade Assessment: II - A patient with mild systemic                            disease. After reviewing the risks and benefits,  the patient was deemed in satisfactory condition to                            undergo the procedure.                           After obtaining informed consent, the endoscope was                            passed under direct vision. Throughout the                            procedure, the patient's blood pressure, pulse, and                            oxygen saturations were  monitored continuously. The                            GIF [id number] #[id number] was introduced through the                            mouth, and advanced to the second part of duodenum.                            The upper GI endoscopy was accomplished without                            difficulty. The patient tolerated the procedure                            well. Scope In: Scope Out: Findings:                 The examined esophagus was normal. Several biopsies                            were obtained in the distal esophagus with cold                            forceps for evaluation of chronic inflammation.                           Multiple small sessile polyps were found in the                            gastric fundus and in the gastric body.                           The exam of the stomach was otherwise normal.                           Biopsies were taken with a cold forceps in the                            gastric body, at  the incisura and in the gastric                            antrum for Helicobacter pylori testing.                           The examined duodenum was normal. Biopsies for                            histology were taken with a cold forceps for                            evaluation of celiac disease. Complications:            No immediate complications. Estimated Blood Loss:     Estimated blood loss was minimal. Impression:               - Normal esophagus.                           - Multiple gastric polyps.                           - Normal examined duodenum. Biopsied.                           - Several biopsies were obtained in the distal                            esophagus.                           - Biopsies were taken with a cold forceps for                            Helicobacter pylori testing. Recommendation:           - Patient has a contact number available for                            emergencies. The signs and symptoms of potential                             delayed complications were discussed with the                            patient. Return to normal activities tomorrow.                            Written discharge instructions were provided to the                            patient.                           - Resume previous diet.                           -  Continue present medications.                           - Await pathology results.                           - Proceed with MRI abd with contrast + MRCP given                            RUQ reminiscent of gallbladder pain before                            cholecystectomy done for gallstones.                           - Depending on imaging and biopsy results may try                            Dexilant or vonaprazan for better GERD control.                            Await fecal elastase. Fecal calprotectin not                            elevated. Nannette Babe, MD 03/14/2024 4:22:38 PM This report has been signed electronically.

## 2024-03-16 ENCOUNTER — Telehealth: Payer: Self-pay

## 2024-03-16 LAB — PANCREATIC ELASTASE, FECAL: Pancreatic Elastase-1, Stool: >800 ug/g (ref >200)

## 2024-03-16 NOTE — Telephone Encounter (Signed)
  Follow up Call-     03/14/2024    3:10 PM 10/08/2022    9:46 AM  Call back number  Post procedure Call Back phone  # [phone]  Permission to leave phone message Yes Yes     Patient questions:  Do you have a fever, pain , or abdominal swelling? No. Pain Score  0 *  Have you tolerated food without any problems? Yes.    Have you been able to return to your normal activities? Yes.    Do you have any questions about your discharge instructions: Diet   No. Medications  No. Follow up visit  No.  Do you have questions or concerns about your Care? Yes.    Actions: * If pain score is 4 or above: No action needed, pain <4.  Patient states she is doing well, other than she had some heartburn after she ate an orange and normally does not experience that. States fine with other foods otherwise. Asked patient if she received a GERD handout with anti-reflux precautions when she was discharged. She states no. I am mailing her a handout to her home address for her to review.

## 2024-03-20 ENCOUNTER — Ambulatory Visit: Payer: Self-pay | Admitting: Internal Medicine

## 2024-03-20 ENCOUNTER — Other Ambulatory Visit: Payer: Self-pay

## 2024-03-20 DIAGNOSIS — K219 Gastro-esophageal reflux disease without esophagitis: Secondary | ICD-10-CM

## 2024-03-20 DIAGNOSIS — R1011 Right upper quadrant pain: Secondary | ICD-10-CM

## 2024-03-20 LAB — SURGICAL PATHOLOGY

## 2024-03-20 MED ORDER — DEXLANSOPRAZOLE 60 MG PO CPDR: 60.0000 mg | DELAYED_RELEASE_CAPSULE | Freq: Every day | ORAL | 3 refills | Status: DC

## 2024-03-26 ENCOUNTER — Other Ambulatory Visit (HOSPITAL_COMMUNITY): Payer: Self-pay | Admitting: Internal Medicine

## 2024-03-26 ENCOUNTER — Ambulatory Visit (HOSPITAL_COMMUNITY)
Admission: RE | Admit: 2024-03-26 | Discharge: 2024-03-26 | Disposition: A | Discharge status: Home / Self Care | Source: Ambulatory Visit | Attending: Internal Medicine | Admitting: Internal Medicine

## 2024-03-26 DIAGNOSIS — R1011 Right upper quadrant pain: Secondary | ICD-10-CM | POA: Diagnosis present

## 2024-03-26 DIAGNOSIS — R109 Unspecified abdominal pain: Secondary | ICD-10-CM | POA: Diagnosis present

## 2024-03-26 DIAGNOSIS — K219 Gastro-esophageal reflux disease without esophagitis: Secondary | ICD-10-CM | POA: Diagnosis present

## 2024-03-26 MED ORDER — GADOBUTROL 1 MMOL/ML IV SOLN
7.0000 mL | Freq: Once | INTRAVENOUS | Status: AC | PRN
Start: 2024-03-26 — End: 2024-03-26
  Administered 2024-03-26: 7 mL via INTRAVENOUS

## 2024-03-29 ENCOUNTER — Ambulatory Visit: Payer: Self-pay | Admitting: Internal Medicine

## 2024-04-05 ENCOUNTER — Other Ambulatory Visit (HOSPITAL_COMMUNITY): Payer: Self-pay

## 2024-04-05 ENCOUNTER — Telehealth: Payer: Self-pay

## 2024-04-05 ENCOUNTER — Other Ambulatory Visit: Payer: Self-pay

## 2024-04-05 MED ORDER — VONOPRAZAN FUMARATE 20 MG PO TABS: 20.0000 mg | ORAL_TABLET | Freq: Every day | ORAL | 1 refills | Status: DC

## 2024-04-05 NOTE — Telephone Encounter (Signed)
 PA request has been Submitted. New Encounter has been or will be created for follow up. For additional info see Pharmacy Prior Auth telephone encounter from 04-05-2024.

## 2024-04-05 NOTE — Telephone Encounter (Signed)
 Prescription sent in for voquezna. Will most likely need prior auth.

## 2024-04-05 NOTE — Telephone Encounter (Signed)
 Pharmacy Patient Advocate Encounter   Received notification from Pt Calls Messages that prior authorization for Voquezna 20MG  tablets is required/requested.   Insurance verification completed.   The patient is insured through [facility] .   Per test claim: PA required; PA submitted to above mentioned insurance via CoverMyMeds Key/confirmation #/[id number] Status is pending

## 2024-04-05 NOTE — Telephone Encounter (Signed)
 Please let patient know with continued pain and biopsy from distal esophagus showing inflammation we can try another acid suppression therapy Discontinue PPI and try vonoprazan 20 mg daily Plan the 20 mg dose x 8 weeks and then if better we will try to reduce to 10 mg daily Patient can update us  via MyChart on how she is responding to this new therapy

## 2024-04-05 NOTE — Telephone Encounter (Signed)
 Pharmacy Patient Advocate Encounter  Received notification from [employer] that Prior Authorization for Voquezna 20MG  tablets has been CANCELLED due to medication is on your plan's list of covered drugs.   Co-pay for quantity of 30 tablets per 30 days is $60.00   PA #/Case ID/Reference #: [id number]

## 2024-05-31 ENCOUNTER — Encounter: Payer: Self-pay | Admitting: Internal Medicine

## 2024-05-31 ENCOUNTER — Other Ambulatory Visit: Payer: Self-pay

## 2024-05-31 MED ORDER — VONOPRAZAN FUMARATE 20 MG PO TABS: 20.0000 mg | ORAL_TABLET | Freq: Every day | ORAL | 1 refills | Status: DC

## 2024-06-02 ENCOUNTER — Other Ambulatory Visit (HOSPITAL_COMMUNITY): Payer: Self-pay

## 2024-06-02 ENCOUNTER — Telehealth: Payer: Self-pay

## 2024-06-02 NOTE — Telephone Encounter (Signed)
 Pharmacy Patient Advocate Encounter   Received notification from CoverMyMeds that prior authorization for Voquezna  20MG  tablets is required/requested.   Insurance verification completed.   The patient is insured through [facility] .   Per test claim: PA required; PA submitted to above mentioned insurance via CoverMyMeds Key/confirmation #/EOC [facility] Status is pending

## 2024-06-05 NOTE — Telephone Encounter (Signed)
 Pharmacy Patient Advocate Encounter  Additional information has been requested from the patient's insurance in order to proceed with the prior authorization request. Requested information has been sent, or form has been filled out and faxed back to [employer] [phone]

## 2024-06-06 NOTE — Telephone Encounter (Signed)
 Pharmacy Patient Advocate Encounter  Received notification from [employer] that Prior Authorization for  Voquezna  20MG  tablets has been DENIED.  Full denial letter will be uploaded to the media tab. See denial reason below.  Based on the information provided, you do not meet the established medication-specific criteria or guidelines for Voquezna  at this time.  The request for 30 VOQUEZNA  TAB 20MG  for one month is denied. This decision is based on health plan criteria for VOQUEZNA  TAB 20MG . More than 62 tablets per 365 days is covered only if:  You need treatment for another H. Pylori episode.  The information provided does not show that you meet the criteria listed above.   PA #/Case ID/Reference #: [id number]

## 2024-06-07 ENCOUNTER — Other Ambulatory Visit (HOSPITAL_COMMUNITY): Payer: Self-pay

## 2024-06-07 MED ORDER — VOQUEZNA 10 MG PO TABS: 1.0000 | ORAL_TABLET | Freq: Every day | ORAL | 5 refills | Status: DC

## 2024-06-07 NOTE — Telephone Encounter (Signed)
 Test claim for Voquezna  10mg  shows co-pay of $60.00. FDA allows therapy on this for up to 6 months. Documentation also needs to be updated if patient diagnosis has changed. Per chart, patient diagnosis is gerd WITHOUT esophagitis. This must be clearly updated/changed should any prior authorizations be required in the future for this diagnosis.

## 2024-06-07 NOTE — Telephone Encounter (Signed)
 PA request has been Denied. New Encounter has been or will be created for follow up. For additional info see Pharmacy Prior Auth telephone encounter from 08-01-025.

## 2024-06-07 NOTE — Telephone Encounter (Signed)
 Yes, can try 10 mg dose

## 2024-06-07 NOTE — Telephone Encounter (Signed)
 Correct, the office visit did state GERD without esophagitis. Patient had confirmed esophagitis on biopsy. I updated the problem list with GERD with esophagitis. Patient has been on Voquezna  20 mg for a total of 8 weeks. Patient needs to be decreased to 10 mg dosage. Patient states she is about to run out of medication. Offered samples of Voquezna  10 mg to start once daily while we wait for the prescription from [facility]. Patient agreed. Prescription for Voquezna  10 mg sent to [facility].

## 2024-06-07 NOTE — Telephone Encounter (Signed)
 Hey PA team, Dr. Albertus states the patient's biopsy does show reflux with esophagitis. Can we try re-submitting the PA with reflux esophagitis please.

## 2024-06-07 NOTE — Telephone Encounter (Signed)
 Voquezna  20 mg has been denied through patient's insurance since patient does not have GERD with esophagitis or H. Pylori. Can we try sending in Voquezna  10 mg dose Dr. Albertus?

## 2024-06-07 NOTE — Telephone Encounter (Signed)
 That said the bx of the distal esophagus from May 2025 did show reflux inflammation which is medically consistent with esophagitis

## 2024-06-13 ENCOUNTER — Other Ambulatory Visit (HOSPITAL_COMMUNITY): Payer: Self-pay

## 2024-06-19 ENCOUNTER — Telehealth: Admitting: Physician Assistant

## 2024-06-19 ENCOUNTER — Encounter: Payer: Self-pay | Admitting: Family Medicine

## 2024-06-19 DIAGNOSIS — U071 COVID-19: Secondary | ICD-10-CM

## 2024-06-19 DIAGNOSIS — R051 Acute cough: Secondary | ICD-10-CM

## 2024-06-19 DIAGNOSIS — J209 Acute bronchitis, unspecified: Secondary | ICD-10-CM

## 2024-06-19 NOTE — Progress Notes (Signed)
  Because your symptoms are not improving, I feel your condition warrants further evaluation and I recommend that you be seen in a face-to-face visit.   NOTE: There will be NO CHARGE for this E-Visit   If you are having a true medical emergency, please call 911.     For an urgent face to face visit, [facility] has multiple urgent care centers for your convenience.  Click the link below for the full list of locations and hours, walk-in wait times, appointment scheduling options and driving directions:  Urgent Care - [place], [place], [place], [place], [place], [place]       Your MyChart E-visit questionnaire answers were reviewed by a board certified advanced clinical practitioner to complete your personal care plan based on your specific symptoms.    Thank you for using e-Visits.

## 2024-06-20 MED ORDER — HYDROCODONE BIT-HOMATROP MBR 5-1.5 MG/5ML PO SOLN: 5.0000 mL | Freq: Three times a day (TID) | ORAL | 0 refills | Status: DC | PRN

## 2024-06-20 NOTE — Addendum Note (Signed)
 Addended by: WATT RAISIN C on: 06/20/2024 11:44 AM   Modules accepted: Orders

## 2024-06-30 MED ORDER — AZITHROMYCIN 250 MG PO TABS: ORAL_TABLET | ORAL | 0 refills | Status: AC

## 2024-06-30 MED ORDER — PREDNISONE 20 MG PO TABS: ORAL_TABLET | ORAL | 0 refills | Status: DC

## 2024-06-30 NOTE — Addendum Note (Signed)
 Addended by: WATT HARLENE BROCKS on: 06/30/2024 06:41 AM   Modules accepted: Orders

## 2024-07-14 NOTE — Progress Notes (Signed)
 [facility] [address] [place], [place] [address] [phone]  Date:  07/17/2024   Name:  Katrina Ford   DOB:  19-Nov-1960   MRN:  [id number]  PCP:  Watt Harlene JAYSON, MD    Chief Complaint: Photophobia (Onset its been going on for months, originally I had an appointment with the eye doctor and was give eye drops dont seem to be working)   History of Present Illness:  CHARMA MOCARSKI is a 63 y.o. very pleasant female patient who presents with the following:  Patient seen today with a concern about her eyes.  I saw her most recently not quite 1 year ago for her physical History of OSA, GERD, sjogren's syndrome, polyarthrlagia, pre-diabetes, depression, FBM, kidney stones  She has rheumatology care with Dr. Lavell, she is treated with Plaquenil and methotrexate She does have some eye complications with her Sjogren's, she is seen by Dr. Radionchenko with [facility] Most recent visit was in late July   Can give her flu shot today Recommend COVID-19 booster Breast cancer screening- scheduled already  Blood work about one year ago   Discussed the use of AI scribe software for clinical note transcription with the patient, who gave verbal consent to proceed.  History of Present Illness JALEEYAH MUNCE is a 63 year old female with Sjogren's syndrome who presents with persistent dry eyes and light sensitivity.  She has experienced persistent dry eyes and light sensitivity since November of last year. Initially, she consulted with one physician but switched to another due to a focus on surgery. It took six months to get an appointment with the new physician, who confirmed the diagnosis of dry eyes and prescribed eye drops. Despite using the prescribed eye drops and glasses, she continues to experience significant light sensitivity and squinting, even on cloudy days.  She has been using steroid drops and an antibiotic  ointment in her eyes, but her symptoms persist. Pressing on her forehead temporarily relieves the squinting, but the relief is short-lived. She has difficulty driving due to her vision issues, stating 'people really don't want me on the road.'  A punctal plug was placed in one eye, but she has not noticed any improvement. She uses preservative-free artificial tears four times a day and Veozah twice a day, along with other drops in between, which relieve dryness but not the burning sensation or squinting. She performs an eye scrub at night, which is also preservative-free.  She has a history of Sjogren's syndrome, which has caused persistent dry eyes. She has experienced joint issues in the past but reports they are currently stable, although she felt fatigued and had joint pain after being active outside recently.  She is currently on methotrexate and Plaquenil for her autoimmune condition. She has a history of prediabetes and is concerned about her weight, which she feels is increasing despite dieting and exercising. She recently recovered from a month-long bout with COVID-19.  Wt Readings from Last 3 Encounters:  07/17/24 167 lb 9.6 oz (76 kg)  03/14/24 163 lb (73.9 kg)  03/08/24 163 lb (73.9 kg)     Patient Active Problem List   Diagnosis Date Noted   Osteoporosis 07/20/2022   Prediabetes 09/18/2020   Fibromyalgia 10/10/2018   Sjogren's syndrome (HCC) 06/27/2018   Urine incontinence 10/03/2015   Polyarthralgia 08/19/2015   OSA (obstructive sleep apnea) 08/19/2015   Menopausal vaginal dryness 08/14/2014   Depression 01/05/2014  Abdominal pain, RLQ (right lower quadrant) 10/05/2011   Constipation, slow transit 10/01/2011   Abnormal liver enzymes 10/01/2011   IRRITABLE BOWEL SYNDROME 07/18/2010   GERD with esophagitis 12/01/2007   HIATAL HERNIA 12/01/2007   HEADACHE, CHRONIC 12/01/2007   GASTRITIS 07/27/2000    Past Medical History:  Diagnosis Date   Chronic headache     Depression    Elevated hemoglobin A1c    pre diabetic   Family history of malignant neoplasm of gastrointestinal tract    Frozen shoulder syndrome    Gastritis    GERD (gastroesophageal reflux disease)    Hiatal hernia    IBS (irritable bowel syndrome)    Kidney stones    Osteoporosis    Sciatic leg pain    May 2018   Sjogren's disease (HCC)    Sjogren's syndrome (HCC) 06/27/2018   With inflammatory arthritis. [facility] Dr. Ishmael    Sleep apnea     Past Surgical History:  Procedure Laterality Date   CHOLECYSTECTOMY  1999/08/25   ESOPHAGEAL MANOMETRY  11/28/2012   Procedure: ESOPHAGEAL MANOMETRY (EM);  Surgeon: Alm JONELLE Gander, MD;  Location: [facility];  Service: Endoscopy;  Laterality: N/A;   FOOT SURGERY Bilateral 03/2013   LITHOTRIPSY  2003&2012   TONSILLECTOMY      Social History   Tobacco Use   Smoking status: Never   Smokeless tobacco: Never  Vaping Use   Vaping status: Never Used  Substance Use Topics   Alcohol use: No   Drug use: No    Family History  Problem Relation Age of Onset   Colon polyps Mother    Diabetes Mother    Heart disease Mother    Diverticulitis Mother    Hyperlipidemia Mother    Hyperlipidemia Father    Cancer Father        skin cancer   Hyperlipidemia Sister    Colon polyps Brother    Hyperlipidemia Brother    Colon cancer Maternal Uncle 74       Passed away in 24-Aug-2009   Diabetes Maternal Uncle    Heart disease Maternal Uncle    Breast cancer Paternal Aunt    Colon cancer Paternal Uncle    Heart disease Paternal Grandmother        Siblings also   Fibromyalgia Cousin    Fibromyalgia Cousin    Fibromyalgia Cousin    Lupus Cousin    Lupus Cousin    Esophageal cancer Neg Hx    Stomach cancer Neg Hx    Rectal cancer Neg Hx     No Known Allergies  Medication list has been reviewed and updated.  Current Outpatient Medications on File Prior to Visit  Medication Sig Dispense Refill   celecoxib (CELEBREX) 200 MG  capsule Take 200 mg by mouth daily.     dicyclomine  (BENTYL ) 20 MG tablet Take 0.5-1 tablets (10-20 mg total) by mouth 3 (three) times daily as needed for spasms. 90 tablet 5   folic acid (FOLVITE) 1 MG tablet Take 2 mg by mouth daily.   6   hydroxychloroquine (PLAQUENIL) 200 MG tablet 400 mg daily.      methotrexate (RHEUMATREX) 2.5 MG tablet TK 6 TS PO 1 TIME A WK  6   Probiotic Product (PROBIOTIC PO) Take 1 capsule by mouth daily.     traZODone  (DESYREL ) 50 MG tablet Take 1-2 tablets (50-100 mg total) by mouth at bedtime as needed for sleep. 180 tablet 0   vitamin B-12 (CYANOCOBALAMIN) 100 MCG  tablet Take 100 mcg by mouth daily.     Vitamin D -Vitamin K (VITAMIN K2-VITAMIN D3 PO) Take by mouth.     Vonoprazan Fumarate  (VOQUEZNA ) 10 MG TABS Take 1 tablet by mouth daily. 30 tablet 5   No current facility-administered medications on file prior to visit.    Review of Systems:  As per HPI- otherwise negative.   Physical Examination: Vitals:   07/17/24 1309  BP: 138/78  Pulse: 84  SpO2: 96%   Vitals:   07/17/24 1309  Weight: 167 lb 9.6 oz (76 kg)  Height: 5' 5 (1.651 m)   Body mass index is 27.89 kg/m. Ideal Body Weight: Weight in (lb) to have BMI = 25: 149.9  GEN: no acute distress. Mild overweight, looks well  HEENT: Atraumatic, Normocephalic.  She does have ptosis of both eyes, would likely benefit from blepharoplasty Otherwise limited eye exam as is available my office today is normal.  Pupils are equal round and reactive to light, limited funduscopic exam is normal Ears and Nose: No external deformity. CV: RRR, No M/G/R. No JVD. No thrill. No extra heart sounds. PULM: CTA B, no wheezes, crackles, rhonchi. No retractions. No resp. distress. No accessory muscle use. ABD: S, NT, ND, +BS. No rebound. No HSM. EXTR: No c/c/e PSYCH: Normally interactive. Conversant.    Assessment and Plan: Sjogren's syndrome with keratoconjunctivitis sicca (HCC) - Plan: Ambulatory referral  to Ophthalmology  Prediabetes - Plan: Comprehensive metabolic panel with GFR, Hemoglobin A1c  Screening for thyroid  disorder - Plan: TSH  Screening for deficiency anemia - Plan: CBC  Ptosis of both eyelids - Plan: Ambulatory referral to Plastic Surgery  Screening, lipid - Plan: Lipid panel  B12 deficiency - Plan: Vitamin B12  Influenza vaccine administered - Plan: Flu vaccine trivalent PF, 6mos and older(Flulaval,Afluria,Fluarix,Fluzone)  Assessment & Plan Dry eye syndrome due to Sjogren's syndrome Chronic dry eye symptoms with light sensitivity and squinting, unrelieved by current treatments. Bilateral symptoms suggest ocular etiology. - Will try to refer to ophthalmologist specializing in autoimmune or dry eye issues.  She would like to be get a second opinion - Order routine blood work including blood counts, metabolic profile, blood sugar, thyroid , and cholesterol.  Bilateral eyelid ptosis Bilateral ptosis potentially contributing to visual symptoms and squinting. Possible insurance coverage for blepharoplasty if vision impairment confirmed. - Refer to plastic surgery for evaluation of blepharoplasty.  Rheumatoid arthritis No significant changes in joint symptoms. Condition well-managed on methotrexate and hydroxychloroquine.  Prediabetes Prediabetes with current blood sugar levels to be evaluated. - Order routine blood work including blood sugar levels.  General Health Maintenance Routine health maintenance discussed. Flu vaccination appropriate post-COVID-19 recovery. - Administer flu shot. - Confirm mammogram is scheduled for January or February.  Signed Harlene Schroeder, MD  Received labs as below, message to patient  Results for orders placed or performed in visit on 07/17/24  CBC   Collection Time: 07/17/24  1:33 PM  Result Value Ref Range   WBC 6.5 4.0 - 10.5 K/uL   RBC 4.46 3.87 - 5.11 Mil/uL   Platelets 262.0 150.0 - 400.0 K/uL   Hemoglobin 13.8 12.0 -  15.0 g/dL   HCT 58.1 63.9 - 53.9 %   MCV 93.8 78.0 - 100.0 fl   MCHC 33.1 30.0 - 36.0 g/dL   RDW 86.0 88.4 - 84.4 %  Comprehensive metabolic panel with GFR   Collection Time: 07/17/24  1:33 PM  Result Value Ref Range   Sodium 140 135 - 145 mEq/L  Potassium 4.1 3.5 - 5.1 mEq/L   Chloride 102 96 - 112 mEq/L   CO2 30 19 - 32 mEq/L   Glucose, Bld 117 (H) 70 - 99 mg/dL   BUN 19 6 - 23 mg/dL   Creatinine, Ser 9.28 0.40 - 1.20 mg/dL   Total Bilirubin 0.4 0.2 - 1.2 mg/dL   Alkaline Phosphatase 59 39 - 117 U/L   AST 18 0 - 37 U/L   ALT 16 0 - 35 U/L   Total Protein 6.6 6.0 - 8.3 g/dL   Albumin 4.6 3.5 - 5.2 g/dL   GFR 09.44 >39.99 mL/min   Calcium 9.5 8.4 - 10.5 mg/dL  Hemoglobin J8r   Collection Time: 07/17/24  1:33 PM  Result Value Ref Range   Hgb A1c MFr Bld 6.5 4.6 - 6.5 %  Lipid panel   Collection Time: 07/17/24  1:33 PM  Result Value Ref Range   Cholesterol 200 0 - 200 mg/dL   Triglycerides 859.9 0.0 - 149.0 mg/dL   HDL 43.89 >60.99 mg/dL   VLDL 71.9 0.0 - 59.9 mg/dL   LDL Cholesterol 883 (H) 0 - 99 mg/dL   Total CHOL/HDL Ratio 4    NonHDL 143.52   TSH   Collection Time: 07/17/24  1:33 PM  Result Value Ref Range   TSH 1.55 0.35 - 5.50 uIU/mL  Vitamin B12   Collection Time: 07/17/24  1:33 PM  Result Value Ref Range   Vitamin B-12 >1500 (H) 211 - 911 pg/mL

## 2024-07-17 ENCOUNTER — Encounter: Payer: Self-pay | Admitting: Family Medicine

## 2024-07-17 ENCOUNTER — Ambulatory Visit: Admitting: Family Medicine

## 2024-07-17 VITALS — BP 138/78 | HR 84 | Ht 65.0 in | Wt 167.6 lb

## 2024-07-17 DIAGNOSIS — Z23 Encounter for immunization: Secondary | ICD-10-CM

## 2024-07-17 DIAGNOSIS — Z1329 Encounter for screening for other suspected endocrine disorder: Secondary | ICD-10-CM | POA: Diagnosis not present

## 2024-07-17 DIAGNOSIS — E538 Deficiency of other specified B group vitamins: Secondary | ICD-10-CM | POA: Diagnosis not present

## 2024-07-17 DIAGNOSIS — M3501 Sicca syndrome with keratoconjunctivitis: Secondary | ICD-10-CM | POA: Diagnosis not present

## 2024-07-17 DIAGNOSIS — R7303 Prediabetes: Secondary | ICD-10-CM | POA: Diagnosis not present

## 2024-07-17 DIAGNOSIS — Z13220 Encounter for screening for lipoid disorders: Secondary | ICD-10-CM | POA: Diagnosis not present

## 2024-07-17 DIAGNOSIS — H02403 Unspecified ptosis of bilateral eyelids: Secondary | ICD-10-CM

## 2024-07-17 DIAGNOSIS — Z130 Encounter for screening for diseases of the blood and blood-forming organs and certain disorders involving the immune mechanism: Secondary | ICD-10-CM | POA: Diagnosis not present

## 2024-07-17 LAB — COMPREHENSIVE METABOLIC PANEL WITH GFR
ALT: 16 U/L (ref 0–35)
AST: 18 U/L (ref 0–37)
Albumin: 4.6 g/dL (ref 3.5–5.2)
Alkaline Phosphatase: 59 U/L (ref 39–117)
BUN: 19 mg/dL (ref 6–23)
CO2: 30 meq/L (ref 19–32)
Calcium: 9.5 mg/dL (ref 8.4–10.5)
Chloride: 102 meq/L (ref 96–112)
Creatinine, Ser: 0.71 mg/dL (ref 0.40–1.20)
GFR: 90.55 mL/min (ref >60.00)
Glucose, Bld: 117 mg/dL — ABNORMAL HIGH (ref 70–99)
Potassium: 4.1 meq/L (ref 3.5–5.1)
Sodium: 140 meq/L (ref 135–145)
Total Bilirubin: 0.4 mg/dL (ref 0.2–1.2)
Total Protein: 6.6 g/dL (ref 6.0–8.3)

## 2024-07-17 LAB — LIPID PANEL
Cholesterol: 200 mg/dL (ref 0–200)
HDL: 56.1 mg/dL (ref >39.00)
LDL Cholesterol: 116 mg/dL — ABNORMAL HIGH (ref 0–99)
NonHDL: 143.52
Total CHOL/HDL Ratio: 4
Triglycerides: 140 mg/dL (ref 0.0–149.0)
VLDL: 28 mg/dL (ref 0.0–40.0)

## 2024-07-17 LAB — HEMOGLOBIN A1C: Hgb A1c MFr Bld: 6.5 % (ref 4.6–6.5)

## 2024-07-17 LAB — CBC
HCT: 41.8 % (ref 36.0–46.0)
Hemoglobin: 13.8 g/dL (ref 12.0–15.0)
MCHC: 33.1 g/dL (ref 30.0–36.0)
MCV: 93.8 fl (ref 78.0–100.0)
Platelets: 262 K/uL (ref 150.0–400.0)
RBC: 4.46 Mil/uL (ref 3.87–5.11)
RDW: 13.9 % (ref 11.5–15.5)
WBC: 6.5 K/uL (ref 4.0–10.5)

## 2024-07-17 LAB — VITAMIN B12: Vitamin B-12: >1500 pg/mL — ABNORMAL HIGH (ref 211–911)

## 2024-07-17 LAB — TSH: TSH: 1.55 u[IU]/mL (ref 0.35–5.50)

## 2024-07-17 NOTE — Patient Instructions (Addendum)
 Good to see you today- I will be in touch with your labs Referral to  -plastic surgery [facility] Our Address:  [address] [place], [place] [address]  Phone Number: [phone]  I will be in touch with your labs Flu shot today   Recommend a covid booster in 3-4 months

## 2024-08-14 NOTE — Telephone Encounter (Signed)
 No further action needed at this time. HM modifier updated for 1 year per results.

## 2024-08-22 ENCOUNTER — Encounter: Payer: Self-pay | Admitting: Family Medicine

## 2024-08-22 ENCOUNTER — Ambulatory Visit (HOSPITAL_BASED_OUTPATIENT_CLINIC_OR_DEPARTMENT_OTHER)
Admission: RE | Admit: 2024-08-22 | Discharge: 2024-08-22 | Disposition: A | Discharge status: Home / Self Care | Source: Ambulatory Visit | Attending: Family Medicine | Admitting: Family Medicine

## 2024-08-22 ENCOUNTER — Ambulatory Visit: Admitting: Family Medicine

## 2024-08-22 ENCOUNTER — Ambulatory Visit: Payer: Self-pay

## 2024-08-22 VITALS — BP 136/78 | HR 82 | Ht 65.0 in | Wt 167.0 lb

## 2024-08-22 DIAGNOSIS — M5442 Lumbago with sciatica, left side: Secondary | ICD-10-CM

## 2024-08-22 MED ORDER — METHOCARBAMOL 500 MG PO TABS: 500.0000 mg | ORAL_TABLET | Freq: Three times a day (TID) | ORAL | 1 refills | Status: AC | PRN

## 2024-08-22 MED ORDER — GABAPENTIN 100 MG PO CAPS: 100.0000 mg | ORAL_CAPSULE | Freq: Every evening | ORAL | 0 refills | Status: AC | PRN

## 2024-08-22 MED ORDER — PREDNISONE 20 MG PO TABS: 40.0000 mg | ORAL_TABLET | Freq: Every day | ORAL | 0 refills | Status: AC

## 2024-08-22 NOTE — Telephone Encounter (Signed)
 FYI Only or Action Required?: Action required by provider: request for appointment.  Patient was last seen in primary care on 07/17/2024 by Katrina Ford, Katrina BROCKS, MD.  Called Nurse Triage reporting Back Pain.  Symptoms began several weeks ago.  Interventions attempted: Nothing.  Symptoms are: gradually worsening.Back pain x 2 weeks, worse at night. Pain down both legs, left is worse.  Triage Disposition: See HCP Within 4 Hours (Or PCP Triage)  Patient/caregiver understands and will follow disposition?: Yes    Copied from CRM #[id number]. Topic: Clinical - Red Word Triage >> Aug 22, 2024 10:18 AM Robinson H wrote: [facility] that prompted transfer to Nurse Triage: Pain going down legs and in foot, can't sleep at night, back hurts laying down, during day legs hurt while working Answer Assessment - Initial Assessment Questions 1. ONSET: When did the pain begin? (e.g., minutes, hours, days)     2 weeks 2. LOCATION: Where does it hurt? (upper, mid or lower back)     lower 3. SEVERITY: How bad is the pain?  (e.g., Scale 1-10; mild, moderate, or severe)     Now - 4-5 4. PATTERN: Is the pain constant? (e.g., yes, no; constant, intermittent)      constant 5. RADIATION: Does the pain shoot into your legs or somewhere else?     Both legs, left is worse 6. CAUSE:  What do you think is causing the back pain?      unsure 7. BACK OVERUSE:  Any recent lifting of heavy objects, strenuous work or exercise?     no 8. MEDICINES: What have you taken so far for the pain? (e.g., nothing, acetaminophen , NSAIDS)     Celebrex 9. NEUROLOGIC SYMPTOMS: Do you have any weakness, numbness, or problems with bowel/bladder control?     no 10. OTHER SYMPTOMS: Do you have any other symptoms? (e.g., fever, abdomen pain, burning with urination, blood in urine)       no 11. PREGNANCY: Is there any chance you are pregnant? When was your last menstrual period?       no  Protocols used: Back  Pain-A-AH  Reason for Disposition  [1] Pain radiates into the thigh or further down the leg AND [2] both legs  Answer Assessment - Initial Assessment Questions 1. ONSET: When did the pain begin? (e.g., minutes, hours, days)     2 weeks 2. LOCATION: Where does it hurt? (upper, mid or lower back)     lower 3. SEVERITY: How bad is the pain?  (e.g., Scale 1-10; mild, moderate, or severe)     Now - 4-5 4. PATTERN: Is the pain constant? (e.g., yes, no; constant, intermittent)      constant 5. RADIATION: Does the pain shoot into your legs or somewhere else?     Both legs, left is worse 6. CAUSE:  What do you think is causing the back pain?      unsure 7. BACK OVERUSE:  Any recent lifting of heavy objects, strenuous work or exercise?     no 8. MEDICINES: What have you taken so far for the pain? (e.g., nothing, acetaminophen , NSAIDS)     Celebrex 9. NEUROLOGIC SYMPTOMS: Do you have any weakness, numbness, or problems with bowel/bladder control?     no 10. OTHER SYMPTOMS: Do you have any other symptoms? (e.g., fever, abdomen pain, burning with urination, blood in urine)       no 11. PREGNANCY: Is there any chance you are pregnant? When was your last menstrual period?  no  Protocols used: Back Pain-A-AH

## 2024-08-22 NOTE — Progress Notes (Signed)
 Acute Office Visit  Subjective:     Patient ID: Katrina Ford, female    DOB: 1961-08-17, 63 y.o.   MRN: [id number]  Chief Complaint  Patient presents with   Back Pain    Back Pain   Patient is in today for back pain.   Discussed the use of AI scribe software for clinical note transcription with the patient, who gave verbal consent to proceed.  History of Present Illness Katrina Ford is a 63 year old female with osteoarthritis who presents with worsening back and leg pain.  She has been experiencing worsening back pain for approximately two weeks. Initially, the pain was localized to her foot, which she thought might be due to stepping on something, but she does not recall any specific injury.   The leg pain is described as a burning sensation in the back of her thigh and shooting pain that travels down her leg, exacerbated by lying on her side. Lying flat on her back causes a sensation of pressure, as if 'there's a fist' pushing in the middle of her back. The pain was severe enough last weekend that she required the use of a cane to move around her house due to an inability to bear weight on her leg.  She has a history of osteoarthritis and has been using Celebrex as needed for joint pain. Recently, she resumed taking Celebrex, which has provided some relief. She also took gabapentin , which she had on hand from a previous hip issue.   No recent trauma, heavy lifting, or specific incident that could have triggered the pain. She experiences pain/tingling/numbness that shoots down her leg, which is a new symptom since the back pain flared up. No numbness in the private area or loss of bladder or bowel control.  Her physical activity has been limited due to pain; she usually walks daily but now can only manage short distances, such as around the cul[place]de[place]sac. She is unable to use the elliptical machine due to the pain.       All review of systems negative except what is listed  in the HPI      Objective:    BP 136/78   Pulse 82   Ht 5' 5 (1.651 m)   Wt 167 lb (75.8 kg)   SpO2 97%   BMI 27.79 kg/m    Physical Exam Vitals reviewed.  Constitutional:      Appearance: Normal appearance.  Musculoskeletal:     Lumbar back: Spasms and tenderness present. No swelling or bony tenderness. Normal range of motion.       Back:  Neurological:     Mental Status: She is alert and oriented to person, place, and time.  Psychiatric:        Mood and Affect: Mood normal.        Behavior: Behavior normal.        Thought Content: Thought content normal.        Judgment: Judgment normal.     No results found for any visits on 08/22/24.      Assessment & Plan:   Problem List Items Addressed This Visit   None Visit Diagnoses       Left-sided low back pain with left-sided sciatica, unspecified chronicity    -  Primary   Relevant Medications   gabapentin  (NEURONTIN ) 100 MG capsule   methocarbamol (ROBAXIN) 500 MG tablet   predniSONE  (DELTASONE ) 20 MG tablet   Other Relevant Orders   DG Lumbar  Spine 2-3 Views      Assessment and Plan Assessment & Plan Low back pain with bilateral sciatica Chronic low back pain with recent exacerbation and bilateral sciatica, mostly left sided. Differential includes nerve impingement or disc pathology. Celebrex provided some relief, indicating an inflammatory component. Gabapentin  used for nerve pain. - Order low back x-ray. - Prescribe prednisone  burst for 5 days, pause Celebrex during this period. - Resume Celebrex after prednisone  course. - Continue PRN gabapentin  for nerve pain. - Prescribe muscle relaxer, caution against use with sedatives. - Provide home stretching exercises. - Consider formal physical therapy if no significant improvement after prednisone  and home exercises.  Left foot tendinitis Pain in the left foot, specifically on the inside of the heel, suggesting tendinitis rather than fracture. Likely  inflamed tendon due to overuse. - Advise ice and heat application. - Recommend range of motion exercises, such as writing the alphabet with the foot.        Meds ordered this encounter  Medications   gabapentin  (NEURONTIN ) 100 MG capsule    Sig: Take 1 capsule (100 mg total) by mouth at bedtime as needed.    Dispense:  30 capsule    Refill:  0    Supervising Provider:   DOMENICA BLACKBIRD A [[id number]]   methocarbamol (ROBAXIN) 500 MG tablet    Sig: Take 1 tablet (500 mg total) by mouth every 8 (eight) hours as needed for muscle spasms.    Dispense:  30 tablet    Refill:  1    Supervising Provider:   DOMENICA BLACKBIRD A [[id number]]   predniSONE  (DELTASONE ) 20 MG tablet    Sig: Take 2 tablets (40 mg total) by mouth daily with breakfast for 5 days.    Dispense:  10 tablet    Refill:  0    Supervising Provider:   DOMENICA BLACKBIRD A [[id number]]    Return if symptoms worsen or fail to improve.  Waddell KATHEE Mon, NP

## 2024-08-23 ENCOUNTER — Ambulatory Visit: Admitting: Family Medicine

## 2024-08-25 ENCOUNTER — Encounter: Payer: Self-pay | Admitting: Family Medicine

## 2024-08-25 ENCOUNTER — Ambulatory Visit: Payer: Self-pay | Admitting: Family Medicine

## 2024-08-25 DIAGNOSIS — M5442 Lumbago with sciatica, left side: Secondary | ICD-10-CM

## 2024-08-25 NOTE — Telephone Encounter (Signed)
X-ray not read yet.

## 2024-08-25 NOTE — Telephone Encounter (Signed)
 Result note added. Okay to increase gabapentin  to 300 mg daily PRN.

## 2024-09-13 ENCOUNTER — Ambulatory Visit: Admitting: Physical Medicine and Rehabilitation

## 2024-10-27 ENCOUNTER — Encounter

## 2024-11-01 ENCOUNTER — Telehealth: Payer: Self-pay

## 2024-11-01 ENCOUNTER — Other Ambulatory Visit (HOSPITAL_COMMUNITY): Payer: Self-pay

## 2024-11-01 NOTE — Telephone Encounter (Signed)
 Pharmacy Patient Advocate Encounter   Received notification from CoverMyMeds that prior authorization for Voquezna  10MG  tablets is required/requested.   Insurance verification completed.   The patient is insured through [facility].   Per test claim: PA required; PA submitted to above mentioned insurance via Latent Key/confirmation #/[id number] Status is pending

## 2024-11-06 NOTE — Telephone Encounter (Signed)
 Left message for patient to call back

## 2024-11-06 NOTE — Telephone Encounter (Signed)
 Please make patient aware that her insurance is requiring her to try and fail or have an adequate response to other PPIs before they will continue to pay for vonoprazan  She has tried lansoprazole  and pantoprazole  and famotidine in the past  Would have her try dexlansoprazole  60 mg once daily She should let us  know how she responds to this medication

## 2024-11-06 NOTE — Telephone Encounter (Signed)
Patient returning call. Please advise, thank you

## 2024-11-06 NOTE — Telephone Encounter (Signed)
 Pharmacy Patient Advocate Encounter  Received notification from [facility] that Prior Authorization for Voquezna  10MG  tablets has been DENIED.  Full denial letter will be uploaded to the media tab. See denial reason below.  This medication is not on the formulary. The member must try and fail (did not work), or be unable to take ALL formulary alternatives (due to interactions, side effects, etc.)  In this case, other formulary alternatives are available for the member to take. The member has tried pantoprazole . Alternative medications include (please refer to member's formulary): generic rabeprazole, generic dexlansoprazole , etc.  Please note: generic dexlansoprazole  requires a separate prior authorization request  PA #/Case ID/Reference #: [id number]

## 2024-11-07 MED ORDER — DEXLANSOPRAZOLE 60 MG PO CPDR: 60.0000 mg | DELAYED_RELEASE_CAPSULE | Freq: Every day | ORAL | 0 refills | Status: AC

## 2024-11-07 NOTE — Telephone Encounter (Signed)
 I have spoken to patient to advise of insurance denial for voquezna  and their insistence that she try other PPI's before covering voquezna . Advised that we will try her on dexlansoprazole  daily in place of Voquezna  and she should reach out to us  if this is ineffective in treating her reflux symptoms. Patient verbalizes understanding.

## 2024-11-08 ENCOUNTER — Other Ambulatory Visit (HOSPITAL_COMMUNITY): Payer: Self-pay

## 2024-11-08 ENCOUNTER — Telehealth: Payer: Self-pay

## 2024-11-08 NOTE — Telephone Encounter (Signed)
 Pharmacy Patient Advocate Encounter   Received notification from CoverMyMeds that prior authorization for Dexlansoprazole  60MG  dr capsules is required/requested.   Insurance verification completed.   The patient is insured through [facility].   Per test claim: PA required; PA submitted to above mentioned insurance via Latent Key/confirmation #/[id number] Status is pending

## 2024-11-13 ENCOUNTER — Other Ambulatory Visit (HOSPITAL_COMMUNITY): Payer: Self-pay

## 2024-11-13 NOTE — Telephone Encounter (Signed)
 Pharmacy Patient Advocate Encounter  Received notification from [facility] that Prior Authorization for Dexlansoprazole  60MG  dr capsules has been APPROVED from 11-08-2024 to 11-08-2025   PA #/Case ID/Reference #: [id number]

## 2025-02-12 ENCOUNTER — Ambulatory Visit: Admitting: Diagnostic Neuroimaging
